# Patient Record
Sex: Female | Born: 1950
Health system: Southern US, Community
[De-identification: ages and names within clinical notes are randomized; demographics above are authoritative.]

## PROBLEM LIST (undated history)

## (undated) DIAGNOSIS — Z8052 Family history of malignant neoplasm of bladder: Secondary | ICD-10-CM

## (undated) DIAGNOSIS — Z803 Family history of malignant neoplasm of breast: Secondary | ICD-10-CM

## (undated) DIAGNOSIS — E785 Hyperlipidemia, unspecified: Secondary | ICD-10-CM

## (undated) DIAGNOSIS — Z801 Family history of malignant neoplasm of trachea, bronchus and lung: Secondary | ICD-10-CM

## (undated) DIAGNOSIS — N390 Urinary tract infection, site not specified: Secondary | ICD-10-CM

## (undated) DIAGNOSIS — I10 Essential (primary) hypertension: Secondary | ICD-10-CM

## (undated) DIAGNOSIS — Z800 Family history of malignant neoplasm of digestive organs: Secondary | ICD-10-CM

## (undated) DIAGNOSIS — B019 Varicella without complication: Secondary | ICD-10-CM

## (undated) DIAGNOSIS — K635 Polyp of colon: Secondary | ICD-10-CM

## (undated) HISTORY — DX: Polyp of colon: K63.5

## (undated) HISTORY — DX: Hyperlipidemia, unspecified: E78.5

## (undated) HISTORY — DX: Essential (primary) hypertension: I10

## (undated) HISTORY — DX: Family history of malignant neoplasm of digestive organs: Z80.0

## (undated) HISTORY — DX: Varicella without complication: B01.9

## (undated) HISTORY — DX: Family history of malignant neoplasm of breast: Z80.3

## (undated) HISTORY — DX: Urinary tract infection, site not specified: N39.0

## (undated) HISTORY — DX: Family history of malignant neoplasm of trachea, bronchus and lung: Z80.1

## (undated) HISTORY — DX: Family history of malignant neoplasm of bladder: Z80.52

## (undated) HISTORY — PX: APPENDECTOMY: SHX54

---

## 1997-04-29 ENCOUNTER — Ambulatory Visit (HOSPITAL_COMMUNITY): Admission: RE | Admit: 1997-04-29 | Discharge: 1997-04-29 | Payer: Self-pay | Admitting: Obstetrics and Gynecology

## 1997-05-08 ENCOUNTER — Ambulatory Visit (HOSPITAL_COMMUNITY): Admission: RE | Admit: 1997-05-08 | Discharge: 1997-05-08 | Payer: Self-pay | Admitting: Obstetrics and Gynecology

## 1998-01-06 ENCOUNTER — Encounter: Payer: Self-pay | Admitting: Obstetrics and Gynecology

## 1998-01-06 ENCOUNTER — Ambulatory Visit (HOSPITAL_COMMUNITY): Admission: RE | Admit: 1998-01-06 | Discharge: 1998-01-06 | Payer: Self-pay | Admitting: Obstetrics and Gynecology

## 1998-05-13 ENCOUNTER — Other Ambulatory Visit: Admission: RE | Admit: 1998-05-13 | Discharge: 1998-05-13 | Payer: Self-pay | Admitting: Obstetrics and Gynecology

## 1999-11-08 ENCOUNTER — Other Ambulatory Visit: Admission: RE | Admit: 1999-11-08 | Discharge: 1999-11-08 | Payer: Self-pay | Admitting: Obstetrics and Gynecology

## 1999-12-02 ENCOUNTER — Encounter: Payer: Self-pay | Admitting: Obstetrics and Gynecology

## 1999-12-02 ENCOUNTER — Ambulatory Visit (HOSPITAL_COMMUNITY): Admission: RE | Admit: 1999-12-02 | Discharge: 1999-12-02 | Payer: Self-pay | Admitting: Obstetrics and Gynecology

## 2000-11-08 ENCOUNTER — Other Ambulatory Visit: Admission: RE | Admit: 2000-11-08 | Discharge: 2000-11-08 | Payer: Self-pay | Admitting: Obstetrics and Gynecology

## 2002-05-01 ENCOUNTER — Ambulatory Visit (HOSPITAL_COMMUNITY): Admission: RE | Admit: 2002-05-01 | Discharge: 2002-05-01 | Payer: Self-pay | Admitting: Obstetrics and Gynecology

## 2002-05-01 ENCOUNTER — Encounter: Payer: Self-pay | Admitting: Obstetrics and Gynecology

## 2007-09-26 ENCOUNTER — Emergency Department (HOSPITAL_COMMUNITY): Admission: EM | Admit: 2007-09-26 | Discharge: 2007-09-26 | Payer: Self-pay | Admitting: Emergency Medicine

## 2008-12-24 ENCOUNTER — Encounter: Admission: RE | Admit: 2008-12-24 | Discharge: 2008-12-24 | Payer: Self-pay | Admitting: Obstetrics and Gynecology

## 2009-01-04 ENCOUNTER — Encounter: Admission: RE | Admit: 2009-01-04 | Discharge: 2009-01-04 | Payer: Self-pay | Admitting: Obstetrics and Gynecology

## 2009-01-12 ENCOUNTER — Ambulatory Visit: Payer: Self-pay | Admitting: Internal Medicine

## 2009-01-12 DIAGNOSIS — I10 Essential (primary) hypertension: Secondary | ICD-10-CM

## 2009-01-12 DIAGNOSIS — E782 Mixed hyperlipidemia: Secondary | ICD-10-CM | POA: Insufficient documentation

## 2009-07-06 ENCOUNTER — Ambulatory Visit: Payer: Self-pay | Admitting: Internal Medicine

## 2009-07-26 ENCOUNTER — Encounter (INDEPENDENT_AMBULATORY_CARE_PROVIDER_SITE_OTHER): Payer: Self-pay | Admitting: *Deleted

## 2009-08-24 ENCOUNTER — Encounter (INDEPENDENT_AMBULATORY_CARE_PROVIDER_SITE_OTHER): Payer: Self-pay | Admitting: *Deleted

## 2009-08-24 ENCOUNTER — Ambulatory Visit: Payer: Self-pay | Admitting: Gastroenterology

## 2009-08-30 ENCOUNTER — Telehealth (INDEPENDENT_AMBULATORY_CARE_PROVIDER_SITE_OTHER): Payer: Self-pay | Admitting: *Deleted

## 2009-09-03 ENCOUNTER — Ambulatory Visit: Payer: Self-pay | Admitting: Gastroenterology

## 2009-09-03 HISTORY — PX: COLONOSCOPY: SHX174

## 2009-09-08 ENCOUNTER — Encounter: Payer: Self-pay | Admitting: Gastroenterology

## 2009-12-31 ENCOUNTER — Ambulatory Visit: Payer: Self-pay | Admitting: Internal Medicine

## 2009-12-31 LAB — CONVERTED CEMR LAB
ALT: 21 units/L (ref 0–35)
AST: 22 units/L (ref 0–37)
Albumin: 4 g/dL (ref 3.5–5.2)
BUN: 17 mg/dL (ref 6–23)
Basophils Relative: 1 % (ref 0.0–3.0)
CO2: 28 meq/L (ref 19–32)
Chloride: 106 meq/L (ref 96–112)
Cholesterol: 219 mg/dL — ABNORMAL HIGH (ref 0–200)
Creatinine, Ser: 0.9 mg/dL (ref 0.4–1.2)
Direct LDL: 147.9 mg/dL
Eosinophils Absolute: 0.1 10*3/uL (ref 0.0–0.7)
Eosinophils Relative: 2.8 % (ref 0.0–5.0)
Glucose, Bld: 103 mg/dL — ABNORMAL HIGH (ref 70–99)
Hemoglobin: 12 g/dL (ref 12.0–15.0)
Ketones, ur: NEGATIVE mg/dL
Leukocytes, UA: NEGATIVE
Lymphocytes Relative: 41.2 % (ref 12.0–46.0)
MCHC: 34.2 g/dL (ref 30.0–36.0)
Monocytes Relative: 8.3 % (ref 3.0–12.0)
Neutro Abs: 2.3 10*3/uL (ref 1.4–7.7)
RBC: 3.89 M/uL (ref 3.87–5.11)
Specific Gravity, Urine: <=1.005 (ref 1.000–1.030)
TSH: 1.48 microintl units/mL (ref 0.35–5.50)
Total Protein: 6.5 g/dL (ref 6.0–8.3)
Urine Glucose: NEGATIVE mg/dL
WBC: 4.9 10*3/uL (ref 4.5–10.5)
pH: 6 (ref 5.0–8.0)

## 2010-01-05 ENCOUNTER — Encounter: Payer: Self-pay | Admitting: Internal Medicine

## 2010-01-05 ENCOUNTER — Ambulatory Visit: Payer: Self-pay | Admitting: Internal Medicine

## 2010-03-02 ENCOUNTER — Ambulatory Visit (HOSPITAL_COMMUNITY)
Admission: RE | Admit: 2010-03-02 | Discharge: 2010-03-02 | Payer: Self-pay | Source: Home / Self Care | Attending: Internal Medicine | Admitting: Internal Medicine

## 2010-03-13 ENCOUNTER — Encounter: Payer: Self-pay | Admitting: Obstetrics and Gynecology

## 2010-03-14 ENCOUNTER — Encounter: Payer: Self-pay | Admitting: Internal Medicine

## 2010-03-15 ENCOUNTER — Encounter
Admission: RE | Admit: 2010-03-15 | Discharge: 2010-03-15 | Payer: Self-pay | Source: Home / Self Care | Attending: Internal Medicine | Admitting: Internal Medicine

## 2010-03-20 LAB — CONVERTED CEMR LAB
AST: 20 units/L (ref 0–37)
Albumin: 4.3 g/dL (ref 3.5–5.2)
Basophils Absolute: 0 10*3/uL (ref 0.0–0.1)
CO2: 30 meq/L (ref 19–32)
Cholesterol: 242 mg/dL — ABNORMAL HIGH (ref 0–200)
Direct LDL: 165.9 mg/dL
Eosinophils Absolute: 0.1 10*3/uL (ref 0.0–0.7)
Glucose, Bld: 93 mg/dL (ref 70–99)
HCT: 34.2 % — ABNORMAL LOW (ref 36.0–46.0)
HDL: 71.4 mg/dL (ref >39.00)
Hemoglobin, Urine: NEGATIVE
Hemoglobin: 11.9 g/dL — ABNORMAL LOW (ref 12.0–15.0)
Lymphs Abs: 2.1 10*3/uL (ref 0.7–4.0)
MCHC: 34.7 g/dL (ref 30.0–36.0)
MCV: 92.6 fL (ref 78.0–100.0)
Neutro Abs: 3.9 10*3/uL (ref 1.4–7.7)
Potassium: 3.9 meq/L (ref 3.5–5.1)
RDW: 12.5 % (ref 11.5–14.6)
Sodium: 143 meq/L (ref 135–145)
Total Protein, Urine: NEGATIVE mg/dL
Urine Glucose: NEGATIVE mg/dL
VLDL: 17.4 mg/dL (ref 0.0–40.0)

## 2010-03-22 NOTE — Assessment & Plan Note (Signed)
Summary: CPX /NWS  #   Vital Signs:  Patient profile:   60 year old female Height:      65 inches Weight:      153.25 pounds BMI:     25.59 O2 Sat:      94 % on Room air Temp:     98.5 degrees F oral Pulse rate:   87 / minute BP sitting:   132 / 90  (left arm) Cuff size:   regular  Vitals Entered By: Zella Ball Ewing CMA Duncan Dull) (January 05, 2010 11:42 AM)  O2 Flow:  Room air   History of Present Illness: here for wellness,  Pt denies CP, worsening sob, doe, wheezing, orthopnea, pnd, worsening LE edema, palps, dizziness or syncope  Pt denies new neuro symptoms such as headache, facial or extremity weakness  Pt denies polydipsia, polyuria.  Overall good compliance with meds, trying to follow low chol diet, wt stable, little excercise however.  No fever, wt loss, night sweats, loss of appetite or other constitutional symptoms  Denies worsening depressive symptoms, suicidal ideation, or panic.   Overall good compliance with meds, and good tolerability.  .Pt states good ability with ADL's, low fall risk, home safety reviewed and adequate, no significant change in hearing or vision, trying to follow lower chol diet, and occasionally active only with regular excercise.   Never took the losartan or welchol and has been trying lower salt diet and above.   Problems Prior to Update: 1)  Skin Rash  (ICD-782.1) 2)  Skin Lesion  (ICD-709.9) 3)  Hyperlipidemia  (ICD-272.4) 4)  Preventive Health Care  (ICD-V70.0) 5)  Hypertension  (ICD-401.9)  Medications Prior to Update: 1)  Losartan Potassium 50 Mg Tabs (Losartan Potassium) .Marland Kitchen.. 1po Once Daily 2)  Aspir-Low 81 Mg Tbec (Aspirin) .Marland Kitchen.. 1  By Mouth Once Daily 3)  Welchol 3.75 Gm Pack (Colesevelam Hcl) .Marland Kitchen.. 1 Pkt By Mouth Once Daily With Water 4)  Lotrisone 1-0.05 % Crea (Clotrimazole-Betamethasone) .... Use Asd Two Times A Day As Needed  Current Medications (verified): 1)  Aspir-Low 81 Mg Tbec (Aspirin) .Marland Kitchen.. 1  By Mouth Once Daily  Allergies  (verified): No Known Drug Allergies  Past History:  Past Medical History: Last updated: 01/12/2009 early menopause at 60yo Hypertension Hyperlipidemia  Past Surgical History: Last updated: 01/12/2009 Denies surgical history  Family History: Last updated: 01/12/2009 1 child with severe spinal meningitis with severe developmental d/o father died with heart disease at 28yo, also COPD/smoker mother with CABG in her early 48's, HTN m-grandmother with stroke m-grandfather with colon cancer m-uncle with pancreatic  cancer m-uncle with bladder cancer  Social History: Last updated: 01/05/2010 Married 2 children - 1 child ADHD 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 Never Smoked Alcohol use-yes - occasional Drug use-no  Risk Factors: Smoking Status: never (01/12/2009)  Social History: Married 2 children - 1 child 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         all otherwise negative per pt -    Physical Exam  General:  alert and well-developed.   Head:  normocephalic and atraumatic.  Eyes:  vision grossly intact, pupils equal, and pupils round.   Ears:  R ear normal and L ear normal.   Nose:  no external deformity and no nasal discharge.   Mouth:  no gingival abnormalities and pharynx pink and moist.   Neck:  supple and no masses.   Lungs:  normal respiratory effort and normal breath sounds.   Heart:  normal rate and regular  rhythm.   Abdomen:  soft, non-tender, and normal bowel sounds.   Msk:  no joint tenderness and no joint swelling.   Extremities:  no edema, no erythema  Neurologic:  cranial nerves II-XII intact and strength normal in all extremities.   Skin:  color normal and no rashes.   Psych:  not anxious appearing and not depressed appearing.     Impression & Recommendations:  Problem # 1:  Preventive Health Care (ICD-V70.0) Overall doing well, age appropriate education and counseling updated, referral for preventive services and immunizations addressed, dietary counseling and smoking status adressed , most recent labs reviewed, ecg reviewed I have personally reviewed and have noted 1.The patient's medical and social history 2.Their use of alcohol, tobacco or illicit drugs 3.Their current medications and supplements 4. Functional ability including ADL's, fall risk, home safety risk, hearing & visual impairment  5.Diet and physical activities 6.Evidence for depression or mood disorders The patients weight, height, BMI  have been recorded in the chart I have made referrals, counseling and provided education to the patient based review of the above  Orders: EKG w/ Interpretation (93000)  Problem # 2:  HYPERLIPIDEMIA (ICD-272.4)  The following medications were removed from the medication list:    Welchol 3.75 Gm Pack (Colesevelam hcl) .Marland Kitchen... 1 pkt by mouth once daily with water  Labs Reviewed: SGOT: 22 (12/31/2009)   SGPT: 21 (12/31/2009)   HDL:61.10 (12/31/2009), 71.40 (01/12/2009)  Chol:219 (12/31/2009), 242 (01/12/2009)  Trig:81.0 (12/31/2009), 87.0 (01/12/2009) pt declines further med, Pt to continue diet efforts,; to check labs next visit - goal LDL less than 100  Complete Medication List: 1)  Aspir-low 81 Mg Tbec (Aspirin) .Marland Kitchen.. 1  by mouth once daily  Other Orders: Admin 1st Vaccine ([address]) Flu Vaccine 44yrs + [phone])  Patient Instructions: 1)  you had the flu shot today 2)  please  call for your yearly pap smear and mammogram 3)  Continue all previous medications as before this visit  4)  Please follow lower cholesterol diet 5)  Please schedule a follow-up appointment in 1 year, or sooner if needed   Orders Added: 1)  Admin 1st Vaccine [90471] 2)  Flu Vaccine 31yrs + [90658] 3)  EKG w/ Interpretation [93000] 4)  Est. Patient 40-64 years [99396]  Flu Vaccine Consent Questions     Do you have a history of severe allergic reactions to this vaccine? no    Any prior history of allergic reactions to egg and/or gelatin? no    Do you have a sensitivity to the preservative Thimersol? no    Do you have a past history of Guillan-Barre Syndrome? no    Do you currently have an acute febrile illness? no    Have you ever had a severe reaction to latex? no    Vaccine information given and explained to patient? yes    Are you currently pregnant? no    Lot Number:[id number]   Exp Date:08/20/2010   Site Given  Left Deltoid IM       .[email or web address]

## 2010-03-22 NOTE — Letter (Signed)
Summary: [facility] Instructions  [facility]  [address] [place], [place] [address]   Phone: [phone]  Fax: [phone]       Dana Hodge    1950-09-23    MRN: [id number]        Procedure Day Dorna Bloom:  Farrell Ours  09/03/09     Arrival Time:  9:00AM     Procedure Time:  10:00AM     Location of Procedure:                    Juliann Pares _  [facility] (4th Floor)                        PREPARATION FOR COLONOSCOPY WITH MOVIPREP   Starting 5 days prior to your procedure 08/29/09 do not eat nuts, seeds, popcorn, corn, beans, peas,  salads, or any raw vegetables.  Do not take any fiber supplements (e.g. Metamucil, Citrucel, and Benefiber).  THE DAY BEFORE YOUR PROCEDURE         DATE: 09/02/09  DAY: THURSDAY  1.  Drink clear liquids the entire day-NO SOLID FOOD  2.  Do not drink anything colored red or purple.  Avoid juices with pulp.  No orange juice.  3.  Drink at least 64 oz. (8 glasses) of fluid/clear liquids during the day to prevent dehydration and help the prep work efficiently.  CLEAR LIQUIDS INCLUDE: Water Jello Ice Popsicles Tea (sugar ok, no milk/cream) Powdered fruit flavored drinks Coffee (sugar ok, no milk/cream) Gatorade Juice: apple, white grape, white cranberry  Lemonade Clear bullion, consomm, broth Carbonated beverages (any kind) Strained chicken noodle soup Hard Candy                             4.  In the morning, mix first dose of MoviPrep solution:    Empty 1 Pouch A and 1 Pouch B into the disposable container    Add lukewarm drinking water to the top line of the container. Mix to dissolve    Refrigerate (mixed solution should be used within 24 hrs)  5.  Begin drinking the prep at 5:00 p.m. The MoviPrep container is divided by 4 marks.   Every 15 minutes drink the solution down to the next mark (approximately 8 oz) until the full liter is complete.   6.  Follow completed prep with 16 oz of clear liquid of your choice (Nothing  red or purple).  Continue to drink clear liquids until bedtime.  7.  Before going to bed, mix second dose of MoviPrep solution:    Empty 1 Pouch A and 1 Pouch B into the disposable container    Add lukewarm drinking water to the top line of the container. Mix to dissolve    Refrigerate  THE DAY OF YOUR PROCEDURE      DATE: 09/03/09  DAY: FRIDAY  Beginning at 5:00AM (5 hours before procedure):         1. Every 15 minutes, drink the solution down to the next mark (approx 8 oz) until the full liter is complete.  2. Follow completed prep with 16 oz. of clear liquid of your choice.    3. You may drink clear liquids until 8:00AM (2 HOURS BEFORE PROCEDURE).   MEDICATION INSTRUCTIONS  Unless otherwise instructed, you should take regular prescription medications with a small sip of water   as early as possible the morning  of your procedure.        OTHER INSTRUCTIONS  You will need a responsible adult at least 58 years of age to accompany you and drive you home.   This person must remain in the waiting room during your procedure.  Wear loose fitting clothing that is easily removed.  Leave jewelry and other valuables at home.  However, you may wish to bring a book to read or  an iPod/MP3 player to listen to music as you wait for your procedure to start.  Remove all body piercing jewelry and leave at home.  Total time from sign-in until discharge is approximately 2-3 hours.  You should go home directly after your procedure and rest.  You can resume normal activities the  day after your procedure.  The day of your procedure you should not:   Drive   Make legal decisions   Operate machinery   Drink alcohol   Return to work  You will receive specific instructions about eating, activities and medications before you leave.    The above instructions have been reviewed and explained to me by  Wyona Almas RN  August 24, 2009 1:58 PM     I fully understand and can  verbalize these instructions _____________________________ Date _________

## 2010-03-22 NOTE — Letter (Signed)
Summary: Results Letter  [facility]  [address] [place], [place] [address]   Phone: [phone]  Fax: [phone]        September 08, 2009 MRN: [id number]    [facility] [address] [place], [place]  [address]    Dear Ms. Puthoff,   Good news.  The polyp(s) that were removed during your recent procedure were NOT pre-cancerous.  You should continue to follow current colorectal cancer screening guidelines with a repeat colonoscopy in 10 years.  We will therefore put your information in our reminder system and will contact you in 10 years to schedule a repeat procedure.  Please call if you have any questions or concerns.       Sincerely,  Rachael Fee MD  This letter has been electronically signed by your physician.  Appended Document: Results Letter letter mailed.

## 2010-03-22 NOTE — Letter (Signed)
Summary: Previsit letter  [facility]  [address] [place], [place] [address]   Phone: [phone]  Fax: [phone]       07/26/2009 MRN: [id number]  [facility] [address] [place], [place]  [address]  Dear Ms. Aul,  Welcome to the Gastroenterology Division at [facility].    You are scheduled to see a nurse for your pre-procedure visit on 08-27-09 at 8:30a.m. on the 3rd floor at [facility], [address]  We ask that you try to arrive at our office 15 minutes prior to your appointment time to allow for check-in.  Your nurse visit will consist of discussing your medical and surgical history, your immediate family medical history, and your medications.    Please bring a complete list of all your medications or, if you prefer, bring the medication bottles and we will list them.  We will need to be aware of both prescribed and over the counter drugs.  We will need to know exact dosage information as well.  If you are on blood thinners (Coumadin, Plavix, Aggrenox, Ticlid, etc.) please call our office today/prior to your appointment, as we need to consult with your physician about holding your medication.   Please be prepared to read and sign documents such as consent forms, a financial agreement, and acknowledgement forms.  If necessary, and with your consent, a friend or relative is welcome to sit-in on the nurse visit with you.  Please bring your insurance card so that we may make a copy of it.  If your insurance requires a referral to see a specialist, please bring your referral form from your primary care physician.  No co-pay is required for this nurse visit.     If you cannot keep your appointment, please call [phone] to cancel or reschedule prior to your appointment date.  This allows [place] the opportunity to schedule an appointment for another patient in need of care.    Thank you for choosing  Gastroenterology for your medical  needs.  We appreciate the opportunity to care for you.  Please visit [place] at our website  to learn more about our practice.                     Sincerely.                                                                                                                   The Gastroenterology Division

## 2010-03-22 NOTE — Miscellaneous (Signed)
Summary: [facility] Previsit/prep  Clinical Lists Changes  Medications: Added new medication of MOVIPREP 100 GM  SOLR (PEG-KCL-NACL-NASULF-NA ASC-C) As per prep instructions. - Signed Rx of MOVIPREP 100 GM  SOLR (PEG-KCL-NACL-NASULF-NA ASC-C) As per prep instructions.;  #1 x 0;  Signed;  Entered by: Wyona Almas RN;  Authorized by: Rachael Fee MD;  Method used: Electronically to [facility] [address] [place], [place], [place]  [address], Ph: [phone], Fax: [phone] Observations: Added new observation of NKA: T (08/24/2009 13:15)    Prescriptions: MOVIPREP 100 GM  SOLR (PEG-KCL-NACL-NASULF-NA ASC-C) As per prep instructions.  #1 x 0   Entered by:   Wyona Almas RN   Authorized by:   Rachael Fee MD   Signed by:   Wyona Almas RN on 08/24/2009   Method used:   Electronically to        [facility] (retail)       [address]       [place], [place]  [address]       Ph: [phone]       Fax: [phone]   RxID:   [id number]

## 2010-03-22 NOTE — Progress Notes (Signed)
Summary: prep ?'s  Phone Note Call from Patient Call back at Work Phone [phone]   Caller: Patient Call For: Dr. Christella Hartigan Reason for Call: Talk to Nurse Summary of Call: prep ?'s Initial call taken by: Vallarie Mare,  August 30, 2009 10:09 AM  Follow-up for Phone Call        pt's questions were answered Follow-up by: Ezra Sites RN,  August 30, 2009 10:13 AM

## 2010-03-22 NOTE — Procedures (Signed)
Summary: Colonoscopy  Patient: Dana Hodge Note: All result statuses are Final unless otherwise noted.  Tests: (1) Colonoscopy (COL)   COL Colonoscopy           DONE     [facility]     [address]     [place], [place]  [address]           COLONOSCOPY PROCEDURE REPORT           PATIENT:  [name], [name]  MR#:  [id number]     BIRTHDATE:  Mar 06, 1950, 58 yrs. old  GENDER:  female     ENDOSCOPIST:  Rachael Fee, MD     REF. BY:  Oliver Barre, M.D.     PROCEDURE DATE:  09/03/2009     PROCEDURE:  Colonoscopy with snare polypectomy     ASA CLASS:  Class II     INDICATIONS:  Routine Risk Screening     MEDICATIONS:   Fentanyl 50 mcg IV, Versed 6 mg IV           DESCRIPTION OF PROCEDURE:   After the risks benefits and     alternatives of the procedure were thoroughly explained, informed     consent was obtained.  Digital rectal exam was performed and     revealed no rectal masses.   The LB [id number] endoscope     was introduced through the anus and advanced to the cecum, which     was identified by both the appendix and ileocecal valve, without     limitations.  The quality of the prep was good, using MoviPrep.     The instrument was then slowly withdrawn as the colon was fully     examined.     <<PROCEDUREIMAGES>>           FINDINGS:  Two small sessile polyps were found, both were removed     with cold snare and both were sent to pathology (jar 1). These     were 4mm across, located in ascending and descending colon     segments (see image4 and image5).  Internal and external     hemorrhoids were found. These were small, not thrombosed.  This     was otherwise a normal examination of the colon (see image6,     image2, and image1).   Retroflexed views in the rectum revealed no     abnormalities.    The scope was then withdrawn from the patient     and the procedure completed.           COMPLICATIONS:  None     ENDOSCOPIC IMPRESSION:     1) Two small polyps were  removed and both were sent to pathology           2) Internal and external hemorrhoids     3) Otherwise normal examination           RECOMMENDATIONS:     1) If the polyp(s) removed today are proven to be adenomatous     (pre-cancerous) polyps, you will need a repeat colonoscopy in 5     years. Otherwise you should continue to follow colorectal cancer     screening guidelines for "routine risk" patients with colonoscopy     in 10 years.     2) You will receive a letter within 1-2 weeks with the results     of your biopsy as well as final recommendations. Please call my  office if you have not received a letter after 3 weeks.           ______________________________     Rachael Fee, MD           n.     eSIGNED:   Rachael Fee at 09/03/2009 10:36 AM           Maretta Bees, [phone]  Note: An exclamation mark (!) indicates a result that was not dispersed into the flowsheet. Document Creation Date: 09/03/2009 10:37 AM _______________________________________________________________________  (1) Order result status: Final Collection or observation date-time: 09/03/2009 10:31 Requested date-time:  Receipt date-time:  Reported date-time:  Referring Physician:   Ordering Physician: Rob Bunting [phone]) Specimen Source:  Source: Launa Grill Order Number: [id number] Lab site:   Appended Document: Colonoscopy     Procedures Next Due Date:    Colonoscopy: 08/2019

## 2010-03-22 NOTE — Assessment & Plan Note (Signed)
   Vital Signs:  Patient profile:   60 year old female Height:      65 inches Weight:      153.25 pounds BMI:     25.59 O2 Sat:      94 % on Room air Temp:     98.5 degrees F oral Pulse rate:   87 / minute BP sitting:   132 / 90  (left arm) Cuff size:   regular  Vitals Entered By: Zella Ball Ewing CMA Duncan Dull) (January 05, 2010 10:49 AM)  O2 Flow:  Room air  CC: Adult Physical/RE   CC:  Adult Physical/RE.  Current Medications (verified): 1)  Losartan Potassium 50 Mg Tabs (Losartan Potassium) .Marland Kitchen.. 1po Once Daily 2)  Aspir-Low 81 Mg Tbec (Aspirin) .Marland Kitchen.. 1  By Mouth Once Daily 3)  Welchol 3.75 Gm Pack (Colesevelam Hcl) .Marland Kitchen.. 1 Pkt By Mouth Once Daily With Water 4)  Lotrisone 1-0.05 % Crea (Clotrimazole-Betamethasone) .... Use Asd Two Times A Day As Needed  Allergies (verified): No Known Drug Allergies   Impression & Recommendations:  Problem # 1:  PREVENTIVE HEALTH CARE (ICD-V70.0)  Orders: EKG w/ Interpretation (93000)  Complete Medication List: 1)  Losartan Potassium 50 Mg Tabs (Losartan potassium) .Marland Kitchen.. 1po once daily 2)  Aspir-low 81 Mg Tbec (Aspirin) .Marland Kitchen.. 1  by mouth once daily 3)  Welchol 3.75 Gm Pack (Colesevelam hcl) .Marland Kitchen.. 1 pkt by mouth once daily with water 4)  Lotrisone 1-0.05 % Crea (Clotrimazole-betamethasone) .... Use asd two times a day as needed  Other Orders: Admin 1st Vaccine ([address]) Flu Vaccine 41yrs + ([address]) No Charge Patient Arrived (NCPA0) (NCPA0)  Patient Instructions: 1)  you had the flu shot today 2)  please remember to call for yearly mammogram and pap smear    Orders Added: 1)  Admin 1st Vaccine [90471] 2)  Flu Vaccine 37yrs + [90658] 3)  EKG w/ Interpretation [93000] 4)  No Charge Patient Arrived (NCPA0) [NCPA0]  Flu Vaccine Consent Questions     Do you have a history of severe allergic reactions to this vaccine? no    Any prior history of allergic reactions to egg and/or gelatin? no    Do you have a sensitivity to the preservative  Thimersol? no    Do you have a past history of Guillan-Barre Syndrome? no    Do you currently have an acute febrile illness? no    Have you ever had a severe reaction to latex? no    Vaccine information given and explained to patient? yes    Are you currently pregnant? no    Lot Number:[id number]   Exp Date:08/20/2010   Site Given  Left Deltoid IM       .[email or web address]

## 2010-03-22 NOTE — Assessment & Plan Note (Signed)
Summary: 6 mos f/u #/cd   Vital Signs:  Patient profile:   60 year old female Height:      64.5 inches Weight:      153 pounds BMI:     25.95 O2 Sat:      99 % on Room air Temp:     98.4 degrees F oral Pulse rate:   80 / minute BP sitting:   120 / 74  (left arm) Cuff size:   regular  Vitals Entered ByZella Ball Ewing (Jul 06, 2009 3:11 PM)  O2 Flow:  Room air CC: 6 Mo ROV/RE   CC:  6 Mo ROV/RE.  History of Present Illness: here to f/u; has 2 specific skin sites to examine; the first to the right low mid abd stable for months,  also a faint but discrete superficial lesion to the right upper forehead at the hairline with mild itching;  Pt denies CP, sob, doe, wheezing, orthopnea, pnd, worsening LE edema, palps, dizziness or syncope   Pt denies new neuro symptoms such as headache, facial or extremity weakness .  Is trying to follow low chol diet, is quite wary of sstatin meds as her friends have had significant side effects such as myalgias.   No other new complaints.    Problems Prior to Update: 1)  Skin Rash  (ICD-782.1) 2)  Skin Lesion  (ICD-709.9) 3)  Hyperlipidemia  (ICD-272.4) 4)  Preventive Health Care  (ICD-V70.0) 5)  Hypertension  (ICD-401.9)  Medications Prior to Update: 1)  Losartan Potassium 50 Mg Tabs (Losartan Potassium) .Marland Kitchen.. 1po Once Daily 2)  Aspir-Low 81 Mg Tbec (Aspirin) .Marland Kitchen.. 1  By Mouth Once Daily  Current Medications (verified): 1)  Losartan Potassium 50 Mg Tabs (Losartan Potassium) .Marland Kitchen.. 1po Once Daily 2)  Aspir-Low 81 Mg Tbec (Aspirin) .Marland Kitchen.. 1  By Mouth Once Daily 3)  Welchol 3.75 Gm Pack (Colesevelam Hcl) .Marland Kitchen.. 1 Pkt By Mouth Once Daily With Water 4)  Lotrisone 1-0.05 % Crea (Clotrimazole-Betamethasone) .... 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   Head:  normocephalic and atraumatic.   Eyes:  vision grossly intact, pupils equal, and pupils round.   Ears:  R ear normal and L ear normal.   Nose:  no external deformity and no nasal discharge.   Mouth:  no gingival abnormalities and pharynx pink and moist.   Neck:  supple and no masses.   Lungs:  normal respiratory effort and normal breath sounds.   Heart:  normal rate and regular rhythm.   Extremities:  no edema, no erythema  Skin:  1 lesion to right lower abd area - c/w seb keratosis,  also 1.5 cm oval area right upper forehead at the hairline plaquelike faint butmild erythema, nontender nonraised   Impression & Recommendations:  Problem # 1:  HYPERLIPIDEMIA (ICD-272.4)  Her updated medication list for this problem includes:    Welchol 3.75 Gm Pack (Colesevelam hcl) .Marland Kitchen... 1 pkt  by mouth once daily with water treat as above, f/u any worsening signs or symptoms ;  consider statin but no wanting to try at this time due to potential myalgias  Labs Reviewed: SGOT: 20 (01/12/2009)   SGPT: 17 (01/12/2009)   HDL:71.40 (01/12/2009)  Chol:242 (01/12/2009)  Trig:87.0 (01/12/2009)  Problem # 2:  SKIN LESION (ICD-709.9) right low mid abd - c/w seb keratosis  Problem # 3:  HYPERTENSION (ICD-401.9)  Her updated medication list for this problem includes:    Losartan Potassium 50 Mg Tabs (Losartan potassium) .Marland Kitchen... 1po once daily  BP today: 120/74 Prior BP: 144/88 (01/12/2009)  Labs Reviewed: K+: 3.9 (01/12/2009) Creat: : 0.9 (01/12/2009)   Chol: 242 (01/12/2009)   HDL: 71.40 (01/12/2009)   TG: 87.0  (01/12/2009) stable overall by hx and exam, ok to continue meds/tx as is   Problem # 4:  Preventive Health Care (ICD-V70.0)   not charged - but due for  colonosocpy  Orders: Gastroenterology Referral (GI)  Problem # 5:  SKIN RASH (ICD-782.1)  right forehead at the hairline - to try lotrisone as needed, to derm if not improved  Her updated medication list for this problem includes:    Lotrisone 1-0.05 % Crea (Clotrimazole-betamethasone) ..... Use asd two times a day as needed  Complete Medication List: 1)  Losartan Potassium 50 Mg Tabs (Losartan potassium) .Marland Kitchen.. 1po once daily 2)  Aspir-low 81 Mg Tbec (Aspirin) .Marland Kitchen.. 1  by mouth once daily 3)  Welchol 3.75 Gm Pack (Colesevelam hcl) .Marland Kitchen.. 1 pkt by mouth once daily with water 4)  Lotrisone 1-0.05 % Crea (Clotrimazole-betamethasone) .... Use asd two times a day as needed  Patient Instructions: 1)  Please take all new medications as prescribed 2)  Continue all previous medications as before this visit  3)  You will be contacted about the referral(s) to: colonoscopy 4)  Please schedule a follow-up appointment in 6 months with:  CPX labs Prescriptions: LOTRISONE 1-0.05 % CREA (CLOTRIMAZOLE-BETAMETHASONE) use asd two times a day as needed  #1 x 1   Entered and Authorized by:   Corwin Levins MD   Signed by:   Corwin Levins MD on 07/06/2009   Method used:   Electronically to        [facility] (retail)       [address]       [place], [place]  [address]       Ph: [phone]       Fax: [phone]   RxID:   [id number] WELCHOL 3.75 GM PACK (COLESEVELAM HCL) 1 pkt by mouth once daily with water  #30 x 11   Entered and Authorized by:   Corwin Levins MD   Signed by:   Corwin Levins MD on 07/06/2009   Method used:   Print then Give to Patient   RxID:   [id number]

## 2010-03-23 ENCOUNTER — Encounter: Payer: Self-pay | Admitting: Internal Medicine

## 2012-05-22 ENCOUNTER — Encounter: Payer: Self-pay | Admitting: Family Medicine

## 2012-05-22 ENCOUNTER — Telehealth: Payer: Self-pay | Admitting: Internal Medicine

## 2012-05-22 NOTE — Telephone Encounter (Signed)
Ok with me 

## 2012-05-22 NOTE — Telephone Encounter (Signed)
Pt would like to switch to Dr Selena Batten from Dr Jonny Ruiz.  Pt would like a female doctor. Is that OK with you Dr Jonny Ruiz? Is that OK with you Dr Selena Batten?

## 2012-05-22 NOTE — Telephone Encounter (Signed)
That is fine with me if ok with PCP. Need new patient visit which is not to address any acute issues but is to review medical history. Thanks.

## 2012-05-22 NOTE — Telephone Encounter (Signed)
Called the patient informed ok per PCP to change providers.  Informed of new providers instruction regarding new patient appointment.

## 2012-05-29 ENCOUNTER — Ambulatory Visit (INDEPENDENT_AMBULATORY_CARE_PROVIDER_SITE_OTHER): Payer: PRIVATE HEALTH INSURANCE | Admitting: Family Medicine

## 2012-05-29 ENCOUNTER — Encounter: Payer: Self-pay | Admitting: Family Medicine

## 2012-05-29 VITALS — BP 130/80 | HR 82 | Temp 98.3°F | Ht 65.5 in | Wt 160.0 lb

## 2012-05-29 DIAGNOSIS — Z7689 Persons encountering health services in other specified circumstances: Secondary | ICD-10-CM

## 2012-05-29 DIAGNOSIS — Z7189 Other specified counseling: Secondary | ICD-10-CM

## 2012-05-29 DIAGNOSIS — I10 Essential (primary) hypertension: Secondary | ICD-10-CM

## 2012-05-29 DIAGNOSIS — E785 Hyperlipidemia, unspecified: Secondary | ICD-10-CM

## 2012-05-29 LAB — LIPID PANEL
HDL: 56.7 mg/dL (ref >39.00)
Triglycerides: 124 mg/dL (ref 0.0–149.0)

## 2012-05-29 LAB — LDL CHOLESTEROL, DIRECT: Direct LDL: 169 mg/dL

## 2012-05-29 NOTE — Progress Notes (Addendum)
Chief Complaint  Patient presents with  . Establish Care    HPI:  Keshayla Schrum Tollett is here to establish care. Has been here for some time - former doctor retired, then saw Dr. Jonny Ruiz for some time.  Last PCP and physical: last physical in 2011  Has the following chronic problems and concerns today: Needs form completed for work  Patient Active Problem List  Diagnosis  . HYPERLIPIDEMIA  . HYPERTENSION  These were tx with medication in the past when on HRT for hot flashes - but then improved and meds stopped. She prefers not to take medications.  Health Maintenance: -pap in 2011, all normal -colonoscopy - normal, 10 years -mammo - Jan 2012 -she will schedule physical  ROS: See pertinent positives and negatives per HPI.  Past Medical History  Diagnosis Date  . Hyperlipidemia   . Hypertension     Family History  Problem Relation Age of Onset  . Heart disease Mother 44    CAD  . Hypertension Mother   . Heart disease Father 62    MI  . COPD Father   . Cancer Maternal Uncle     colon, pancreatic  . Cancer Maternal Grandfather     colon, pancreatic    History   Social History  . Marital Status: Married    Spouse Name: N/A    Number of Children: N/A  . Years of Education: N/A   Social History Main Topics  . Smoking status: Never Smoker   . Smokeless tobacco: None  . Alcohol Use: Yes     Comment: a glass of wine with dinner   . Drug Use: None  . Sexually Active: None   Other Topics Concern  . None   Social History Narrative   Work or School: works as Naval architect, office work      Home Situation: lives with husband and handicapped adult son      Spiritual Beliefs: Christian      Lifestyle: going to the gym 3-4 days per week, working on diet - portion sizes are an issue             Current outpatient prescriptions:aspirin 81 MG tablet, Take 81 mg by mouth daily., Disp: , Rfl:   EXAM:  Filed Vitals:   05/29/12 0830  BP: 130/80  Pulse: 82  Temp:  98.3 F (36.8 C)    Body mass index is 26.21 kg/(m^2).  GENERAL: vitals reviewed and listed above, alert, oriented, appears well hydrated and in no acute distress  HEENT: atraumatic, conjunttiva clear, no obvious abnormalities on inspection of external nose and ears  NECK: no obvious masses on inspection  LUNGS: clear to auscultation bilaterally, no wheezes, rales or rhonchi, good air movement  CV: HRRR, no peripheral edema  MS: moves all extremities without noticeable abnormality  PSYCH: pleasant and cooperative, no obvious depression or anxiety  ASSESSMENT AND PLAN:  Discussed the following assessment and plan:  Encounter to establish care - Plan: Hemoglobin A1c  HYPERLIPIDEMIA - Plan: Lipid Panel  HYPERTENSION -We reviewed the PMH, PSH, FH, SH, Meds and Allergies. -FASTING LABS - needs form faxed to work with lab results -follow up in next few months for physical  -Patient advised to return or notify a doctor immediately if symptoms worsen or persist or new concerns arise.  Patient Instructions  -We have ordered labs or studies at this visit. It can take up to 1-2 weeks for results and processing. We will contact you with instructions  IF your results are abnormal. Normal results will be released to your [facility]. If you have not heard from [place] or can not find your results in [facility] in 2 weeks please contact our office.  -PLEASE SIGN UP FOR MYCHART TODAY   We recommend the following healthy lifestyle measures: - eat a healthy diet consisting of lots of vegetables, fruits, beans, nuts, seeds, healthy meats such as white chicken and fish and whole grains.  - avoid fried foods, fast food, processed foods, sodas, red meet and other fattening foods.  - get a least 150 minutes of aerobic exercise per week.   Follow up in: 2 months for physical exam      Blaine Hari R.

## 2012-05-29 NOTE — Patient Instructions (Addendum)
-  We have ordered labs or studies at this visit. It can take up to 1-2 weeks for results and processing. We will contact you with instructions IF your results are abnormal. Normal results will be released to your [facility]. If you have not heard from [place] or can not find your results in [facility] in 2 weeks please contact our office.  -PLEASE SIGN UP FOR MYCHART TODAY   We recommend the following healthy lifestyle measures: - eat a healthy diet consisting of lots of vegetables, fruits, beans, nuts, seeds, healthy meats such as white chicken and fish and whole grains.  - avoid fried foods, fast food, processed foods, sodas, red meet and other fattening foods.  - get a least 150 minutes of aerobic exercise per week.   Follow up in: 2 months for physical exam

## 2012-05-30 ENCOUNTER — Telehealth: Payer: Self-pay | Admitting: Family Medicine

## 2012-05-30 NOTE — Telephone Encounter (Signed)
Please let patient know,  -cholesterol is a bit high -diabetes screening lab is a little high (>5.6) indicating a risk for developing diabetes  The best treatment to hopefully reverse these findings and prevent adverse health outcomes is a healthy diet and regular exercise.  Schedule follow up in about 3-4 months if not already scheduled to do so.  ALSO, PLEASE FILL OUT FORM FOR WORK FOR PATIENT AND FAX/SEND TO HER WORK AND LET HER KNOW WE COMPLETED THIS. THANKS.

## 2012-05-31 NOTE — Telephone Encounter (Signed)
Called and spoke with pt and pt is aware.  Pt also aware that Preventive Care Confirmation was faxed back to [employer]

## 2012-07-31 ENCOUNTER — Encounter: Payer: PRIVATE HEALTH INSURANCE | Admitting: Family Medicine

## 2012-08-28 ENCOUNTER — Other Ambulatory Visit (HOSPITAL_COMMUNITY)
Admission: RE | Admit: 2012-08-28 | Discharge: 2012-08-28 | Disposition: A | Discharge status: Home / Self Care | Payer: No Typology Code available for payment source | Source: Ambulatory Visit | Attending: Family Medicine | Admitting: Family Medicine

## 2012-08-28 ENCOUNTER — Encounter: Payer: Self-pay | Admitting: Family Medicine

## 2012-08-28 ENCOUNTER — Ambulatory Visit (INDEPENDENT_AMBULATORY_CARE_PROVIDER_SITE_OTHER): Payer: PRIVATE HEALTH INSURANCE | Admitting: Family Medicine

## 2012-08-28 VITALS — BP 110/74 | Temp 98.3°F | Ht 65.5 in | Wt 166.0 lb

## 2012-08-28 DIAGNOSIS — Z0000 Encounter for general adult medical examination without abnormal findings: Secondary | ICD-10-CM

## 2012-08-28 DIAGNOSIS — R7303 Prediabetes: Secondary | ICD-10-CM

## 2012-08-28 DIAGNOSIS — E785 Hyperlipidemia, unspecified: Secondary | ICD-10-CM

## 2012-08-28 DIAGNOSIS — Z01419 Encounter for gynecological examination (general) (routine) without abnormal findings: Secondary | ICD-10-CM | POA: Insufficient documentation

## 2012-08-28 DIAGNOSIS — R7309 Other abnormal glucose: Secondary | ICD-10-CM

## 2012-08-28 DIAGNOSIS — Z1151 Encounter for screening for human papillomavirus (HPV): Secondary | ICD-10-CM | POA: Insufficient documentation

## 2012-08-28 NOTE — Patient Instructions (Addendum)
-  schedule mammoogram - call [phone]  -We recommend the following healthy lifestyle measures: - eat a healthy diet consisting of lots of vegetables, fruits, beans, nuts, seeds, healthy meats such as white chicken and fish and whole grains.  - avoid fried foods, fast food, processed foods, sodas, red meet and other fattening foods.  - get a least 150 minutes of aerobic exercise per week.   -We have ordered labs or studies at this visit. It can take up to 1-2 weeks for results and processing. We will contact you with instructions IF your results are abnormal. Normal results will be released to your [facility]. If you have not heard from [place] or can not find your results in [facility] in 2 weeks please contact our office.  -follow up in in 3-4 month in November for fasting labs and follow up

## 2012-08-28 NOTE — Progress Notes (Signed)
Chief Complaint  Patient presents with  . Annual Exam    HPI:  Here for CPE:  -Concerns today: none  1-2)Prediabetes/HLD: -on last labs -lifestyle recs advised - she reports has not been good with diet and exercise - willing to start working on this -she thinks she could reduce carbs; has gym membership and could try to increase this activty  -vit d/cal: no  -Exercise: gym 3-4 days per week  -Diabetes and Dyslipidemia Screening: done 05/2012  -Hx of HTN: no   -Vaccines: UTD  -pap history: 2011, normal per her report  -FDLMP: postmenopausal, no bleeding  -sexual activity: yes, female partner, no new partners  -wants STI testing: no, refused hep c testing  -FH breast, colon or ovarian ca: see FH -last mammo 2012  -last colonoscopy normal in 2011 per her report -Alcohol, Tobacco, drug use: see social history  Review of Systems - Review of Systems  Constitutional: Negative for fever, weight loss and malaise/fatigue.  Eyes: Negative for blurred vision and double vision.  Respiratory: Negative for shortness of breath.   Cardiovascular: Negative for chest pain and leg swelling.  Gastrointestinal: Negative for nausea and vomiting.  Genitourinary: Negative for dysuria.  Musculoskeletal: Negative for myalgias.  Neurological: Negative for dizziness and headaches.  Endo/Heme/Allergies: Does not bruise/bleed easily.  Psychiatric/Behavioral: Negative for depression and suicidal ideas.     Past Medical History  Diagnosis Date  . Hyperlipidemia   . Hypertension   . Chicken pox   . UTI (urinary tract infection)   . Colon polyp     Family History  Problem Relation Age of Onset  . Heart disease Mother 47    CAD  . Hypertension Mother   . Heart disease Father 9    MI  . COPD Father   . Cancer Maternal Uncle     colon, pancreatic  . Cancer Maternal Grandfather     colon, pancreatic    History   Social History  . Marital Status: Married    Spouse Name: N/A   Number of Children: N/A  . Years of Education: N/A   Social History Main Topics  . Smoking status: Never Smoker   . Smokeless tobacco: None  . Alcohol Use: Yes     Comment: a glass of wine with dinner   . Drug Use: None  . Sexually Active: None   Other Topics Concern  . None   Social History Narrative   Work or School: works as Naval architect, office work      Home Situation: lives with husband and handicapped adult son      Spiritual Beliefs: Christian      Lifestyle: going to the gym 3-4 days per week, working on diet - portion sizes are an issue             Current outpatient prescriptions:aspirin 81 MG tablet, Take 81 mg by mouth daily., Disp: , Rfl:   EXAM:  Filed Vitals:   08/28/12 0923  BP: 110/74  Temp: 98.3 F (36.8 C)    GENERAL: vitals reviewed and listed below, alert, oriented, appears well hydrated and in no acute distress  HEENT: head atraumatic, PERRLA, normal appearance of eyes, ears, nose and mouth. moist mucus membranes.  NECK: supple, no masses or lymphadenopathy  LUNGS: clear to auscultation bilaterally, no rales, rhonchi or wheeze  CV: HRRR, no peripheral edema or cyanosis, normal pedal pulses  BREAST: normal appearance - no lesions or discharge, on palpation normal breast tissue without any  suspicious masses  ABDOMEN: bowel sounds normal, soft, non tender to palpation, no masses, no rebound or guarding  GU: normal appearance of external genitalia - no lesions or masses, normal vaginal mucosa - no abnormal discharge, normal appearance of cervix - no lesions or abnormal discharge, no masses or tenderness on palpation of uterus and ovaries.  RECTAL: refused  SKIN: no rash or abnormal lesions  MS: normal gait, moves all extremities normally  NEURO: CN II-XII grossly intact, normal muscle strength and sensation to light touch on extremities  PSYCH: normal affect, pleasant and cooperative  ASSESSMENT AND PLAN:  Discussed the  following assessment and plan:  Routine general medical examination at a health care facility - Plan: Cytology - PAP -Discussed and advised all [place] preventive services health task force level A and B recommendations for age, sex and risks. -Advised at least 150 minutes of exercise per week and a healthy diet low in saturated fats and sweets and consisting of fresh fruits and vegetables, lean meats such as fish and white chicken and whole grains. -labs, studies and vaccines per orders this encounter  Hyperlipidemia -reviewed labs with patient and discussed options -offered statin - she would perfer to try lifestyle changes and recheck -discussed lifestyle goals and advised follow up in 3-4 months  Prediabetes -lifestyle recs   No orders of the defined types were placed in this encounter.    Patient Instructions  -schedule mammoogram - call [phone]  -We recommend the following healthy lifestyle measures: - eat a healthy diet consisting of lots of vegetables, fruits, beans, nuts, seeds, healthy meats such as white chicken and fish and whole grains.  - avoid fried foods, fast food, processed foods, sodas, red meet and other fattening foods.  - get a least 150 minutes of aerobic exercise per week.   -We have ordered labs or studies at this visit. It can take up to 1-2 weeks for results and processing. We will contact you with instructions IF your results are abnormal. Normal results will be released to your [facility]. If you have not heard from [place] or can not find your results in [facility] in 2 weeks please contact our office.  -follow up in in 3-4 month in November for fasting labs and follow up      No Follow-up on file.  Kriste Basque R.

## 2012-09-02 NOTE — Progress Notes (Signed)
Quick Note:  Called and spoke with pt and pt is aware. ______ 

## 2012-12-26 ENCOUNTER — Other Ambulatory Visit: Payer: Self-pay

## 2013-01-01 ENCOUNTER — Ambulatory Visit: Payer: PRIVATE HEALTH INSURANCE | Admitting: Family Medicine

## 2013-01-10 ENCOUNTER — Ambulatory Visit: Payer: 59 | Admitting: Family Medicine

## 2013-03-07 ENCOUNTER — Encounter: Payer: No Typology Code available for payment source | Admitting: Family Medicine

## 2013-03-07 NOTE — Progress Notes (Signed)
Error   This encounter was created in error - please disregard. 

## 2013-05-13 ENCOUNTER — Other Ambulatory Visit: Payer: Self-pay

## 2013-05-13 DIAGNOSIS — Z1231 Encounter for screening mammogram for malignant neoplasm of breast: Secondary | ICD-10-CM

## 2013-05-22 ENCOUNTER — Ambulatory Visit: Payer: No Typology Code available for payment source

## 2013-05-27 ENCOUNTER — Ambulatory Visit
Admission: RE | Admit: 2013-05-27 | Discharge: 2013-05-27 | Disposition: A | Discharge status: Home / Self Care | Payer: 59 | Source: Ambulatory Visit

## 2013-05-27 DIAGNOSIS — Z1231 Encounter for screening mammogram for malignant neoplasm of breast: Secondary | ICD-10-CM

## 2013-12-01 ENCOUNTER — Encounter: Payer: Self-pay | Admitting: Gastroenterology

## 2013-12-03 DIAGNOSIS — H40033 Anatomical narrow angle, bilateral: Secondary | ICD-10-CM | POA: Insufficient documentation

## 2014-02-06 ENCOUNTER — Encounter: Payer: Self-pay | Admitting: Family Medicine

## 2014-02-06 ENCOUNTER — Ambulatory Visit (INDEPENDENT_AMBULATORY_CARE_PROVIDER_SITE_OTHER): Payer: 59 | Admitting: Family Medicine

## 2014-02-06 VITALS — BP 140/92 | HR 84 | Temp 98.3°F | Ht 65.5 in | Wt 171.8 lb

## 2014-02-06 DIAGNOSIS — IMO0001 Reserved for inherently not codable concepts without codable children: Secondary | ICD-10-CM

## 2014-02-06 DIAGNOSIS — Z0000 Encounter for general adult medical examination without abnormal findings: Secondary | ICD-10-CM

## 2014-02-06 DIAGNOSIS — E785 Hyperlipidemia, unspecified: Secondary | ICD-10-CM

## 2014-02-06 DIAGNOSIS — R739 Hyperglycemia, unspecified: Secondary | ICD-10-CM

## 2014-02-06 DIAGNOSIS — R030 Elevated blood-pressure reading, without diagnosis of hypertension: Secondary | ICD-10-CM

## 2014-02-06 LAB — LIPID PANEL
CHOLESTEROL: 250 mg/dL — AB (ref 0–200)
HDL: 49.8 mg/dL (ref >39.00)
LDL Cholesterol: 174 mg/dL — ABNORMAL HIGH (ref 0–99)
NONHDL: 200.2
Total CHOL/HDL Ratio: 5
Triglycerides: 129 mg/dL (ref 0.0–149.0)
VLDL: 25.8 mg/dL (ref 0.0–40.0)

## 2014-02-06 LAB — BASIC METABOLIC PANEL
BUN: 19 mg/dL (ref 6–23)
CALCIUM: 9.2 mg/dL (ref 8.4–10.5)
CO2: 24 mEq/L (ref 19–32)
Chloride: 105 mEq/L (ref 96–112)
Creatinine, Ser: 1 mg/dL (ref 0.4–1.2)
GFR: 63.56 mL/min (ref >60.00)
GLUCOSE: 97 mg/dL (ref 70–99)
Potassium: 3.8 mEq/L (ref 3.5–5.1)
SODIUM: 137 meq/L (ref 135–145)

## 2014-02-06 LAB — HEMOGLOBIN A1C: HEMOGLOBIN A1C: 5.7 % (ref 4.6–6.5)

## 2014-02-06 NOTE — Patient Instructions (Signed)
BEFORE YOU LEAVE: -labs -schedule follow up in 1-2 months   -get your flu vaccine  -We have ordered labs or studies at this visit. It can take up to 1-2 weeks for results and processing. We will contact you with instructions IF your results are abnormal. Normal results will be released to your [facility]. If you have not heard from [place] or can not find your results in [facility] in 2 weeks please contact our office.   We recommend the following healthy lifestyle measures: - eat a healthy diet consisting of lots of vegetables, fruits, beans, nuts, seeds, healthy meats such as white chicken and fish and whole grains.  - avoid fried foods, fast food, processed foods, sodas, red meet and other fattening foods.  - get a least 150 minutes of aerobic exercise per week.

## 2014-02-06 NOTE — Progress Notes (Signed)
Pre visit review using our clinic review tool, if applicable. No additional management support is needed unless otherwise documented below in the visit note. 

## 2014-02-06 NOTE — Progress Notes (Signed)
HPI:  Here for CPE:  -Concerns and/or follow up today:  Note: she was told to f/u 3 months after her last visit in 08/2012 - it has been almost 1.5 years.  1-2)Prediabetes/HLD: -on last labs -lifestyle recs advised - she reports has not been good with diet and exercise - willing to start working on this  3)Hx HTN: -on antihypertensive in the past -denies: CP, SOB, DOE  -Diet: Poor, lots of junk food   -Exercise: no regular exercise  -Taking folic acid, vitamin D or calcium: no  -Diabetes and Dyslipidemia Screening: FASTING  -Hx of HTN: no  -Vaccines: UTD  -pap history: she declined pap today - wants to do next year, neg hpv and neg pap last visit - but adequacy of transition zone not definite  -FDLMP: n/a  -sexual activity: yes, female partner, no new partners  -wants STI testing: no  -FH breast, colon or ovarian ca: see FH Last mammogram:  UTD Last colon cancer screening:UTD  Breast Ca Risk Assessment: n/a   -Alcohol, Tobacco, drug use: see social history  Review of Systems - no fevers, unintentional weight loss, vision loss, hearing loss, chest pain, sob, hemoptysis, melena, hematochezia, hematuria, genital discharge, changing or concerning skin lesions, bleeding, bruising, loc, thoughts of self harm or SI  Past Medical History  Diagnosis Date  . Hyperlipidemia   . Hypertension   . Chicken pox   . UTI (urinary tract infection)   . Colon polyp     No past surgical history on file.  Family History  Problem Relation Age of Onset  . Heart disease Mother 91    CAD  . Hypertension Mother   . Heart disease Father 20    MI  . COPD Father   . Cancer Maternal Uncle     colon, pancreatic  . Cancer Maternal Grandfather     colon, pancreatic    History   Social History  . Marital Status: Married    Spouse Name: N/A    Number of Children: N/A  . Years of Education: N/A   Social History Main Topics  . Smoking status: Never Smoker   . Smokeless  tobacco: None  . Alcohol Use: Yes     Comment: a glass of wine with dinner   . Drug Use: None  . Sexual Activity: None   Other Topics Concern  . None   Social History Narrative   Work or School: works as Water quality scientist, office work      Home Situation: lives with husband and handicapped adult son      Spiritual Beliefs: Christian      Lifestyle: going to the gym 3-4 days per week, working on diet - portion sizes are an issue             Current outpatient prescriptions: aspirin 81 MG tablet, Take 81 mg by mouth daily., Disp: , Rfl:   EXAM:  Filed Vitals:   02/06/14 0819  BP: 140/92  Pulse: 84  Temp: 98.3 F (36.8 C)  Body mass index is 28.14 kg/(m^2).   GENERAL: vitals reviewed and listed below, alert, oriented, appears well hydrated and in no acute distress  HEENT: head atraumatic, PERRLA, normal appearance of eyes, ears, nose and mouth. moist mucus membranes.  NECK: supple, no masses or lymphadenopathy  LUNGS: clear to auscultation bilaterally, no rales, rhonchi or wheeze  CV: HRRR, no peripheral edema or cyanosis, normal pedal pulses  BREAST: declined  ABDOMEN: bowel sounds normal, soft, non  tender to palpation, no masses, no rebound or guarding  GU: declined  RECTAL: refused  SKIN: no rash or abnormal lesions  MS: normal gait, moves all extremities normally  NEURO: CN II-XII grossly intact, normal muscle strength and sensation to light touch on extremities  PSYCH: normal affect, pleasant and cooperative  ASSESSMENT AND PLAN:  Discussed the following assessment and plan:  Visit for preventive health examination - Plan: Basic metabolic panel  Hyperlipemia - Plan: Lipid Panel  Hyperglycemia - Plan: Hemoglobin A1c  Elevated blood pressure   -Discussed and advised all [place] preventive services health task force level A and B recommendations for age, sex and risks.  -Advised at least 150 minutes of exercise per week and a healthy diet low in  saturated fats and sweets and consisting of fresh fruits and vegetables, lean meats such as fish and white chicken and whole grains.  -FASTING labs, studies and vaccines per orders this encounter  Orders Placed This Encounter  Procedures  . Basic metabolic panel  . Lipid Panel  . Hemoglobin A1c   -discussed options for tx of BP - she opted to do trial low sodium and exercise - advised really needs close follow up to recheck.  -she declined pap today after discussion results - advised to do, she doe not wish to today and agrees to do next year.  Patient advised to return to clinic immediately if symptoms worsen or persist or new concerns.  Patient Instructions  BEFORE YOU LEAVE: -labs -schedule follow up in 1-2 months   -get your flu vaccine  -We have ordered labs or studies at this visit. It can take up to 1-2 weeks for results and processing. We will contact you with instructions IF your results are abnormal. Normal results will be released to your [facility]. If you have not heard from [place] or can not find your results in [facility] in 2 weeks please contact our office.   We recommend the following healthy lifestyle measures: - eat a healthy diet consisting of lots of vegetables, fruits, beans, nuts, seeds, healthy meats such as white chicken and fish and whole grains.  - avoid fried foods, fast food, processed foods, sodas, red meet and other fattening foods.  - get a least 150 minutes of aerobic exercise per week.      No Follow-up on file.  Colin Benton R.

## 2014-04-07 ENCOUNTER — Ambulatory Visit (INDEPENDENT_AMBULATORY_CARE_PROVIDER_SITE_OTHER): Payer: Managed Care, Other (non HMO) | Admitting: Family Medicine

## 2014-04-07 ENCOUNTER — Encounter: Payer: Self-pay | Admitting: Family Medicine

## 2014-04-07 VITALS — BP 140/92 | HR 84 | Temp 98.9°F | Ht 65.5 in | Wt 160.8 lb

## 2014-04-07 DIAGNOSIS — E785 Hyperlipidemia, unspecified: Secondary | ICD-10-CM

## 2014-04-07 DIAGNOSIS — I10 Essential (primary) hypertension: Secondary | ICD-10-CM

## 2014-04-07 DIAGNOSIS — R7303 Prediabetes: Secondary | ICD-10-CM

## 2014-04-07 DIAGNOSIS — R7309 Other abnormal glucose: Secondary | ICD-10-CM

## 2014-04-07 NOTE — Patient Instructions (Signed)
BEFORE YOU LEAVE: -follow up appointment in 2 months - come fasting, drink plenty of water  We recommend the following healthy lifestyle measures: - eat a healthy diet consisting of lots of vegetables, fruits, beans, nuts, seeds, healthy meats such as white chicken and fish and whole grains.  - avoid fried foods, fast food, processed foods, sodas, red meet and other fattening foods.  - get a least 150 minutes of aerobic exercise per week.

## 2014-04-07 NOTE — Progress Notes (Signed)
HPI:  Dana Hodge is a pleasant 64 yo F, but unfortunately with poor compliance with treatment, follow up and preventive recommendations. She is her for follow up for several problems:  1-2)Prediabetes/HLD: -lifestyle recs advised in the past and consider cholesterol medication recent labs  -reports: has really been working on her diet - changed her diet dramatically -denies: polyuria, polydipsia, vision changes, hx reaction to statin  3)Hx HTN: -on antihypertensive in the past, declined restarting antihypertensive last visit -reports: working really hard on her diet, plans to start exercising -denies: CP, SOB, DOE   ROS: See pertinent positives and negatives per HPI.  Past Medical History  Diagnosis Date  . Hyperlipidemia   . Hypertension   . Chicken pox   . UTI (urinary tract infection)   . Colon polyp     No past surgical history on file.  Family History  Problem Relation Age of Onset  . Heart disease Mother 58    CAD  . Hypertension Mother   . Heart disease Father 90    MI  . COPD Father   . Cancer Maternal Uncle     colon, pancreatic  . Cancer Maternal Grandfather     colon, pancreatic    History   Social History  . Marital Status: Married    Spouse Name: N/A  . Number of Children: N/A  . Years of Education: N/A   Social History Main Topics  . Smoking status: Never Smoker   . Smokeless tobacco: Not on file  . Alcohol Use: Yes     Comment: a glass of wine with dinner   . Drug Use: Not on file  . Sexual Activity: Not on file   Other Topics Concern  . None   Social History Narrative   Work or School: works as Water quality scientist, office work      Home Situation: lives with husband and handicapped adult son      Spiritual Beliefs: Christian      Lifestyle: going to the gym 3-4 days per week, working on diet - portion sizes are an issue              Current outpatient prescriptions:  .  aspirin 81 MG tablet, Take 81 mg by mouth daily., Disp:  , Rfl:   EXAM:  Filed Vitals:   04/07/14 1528  BP: 140/92  Pulse: 84  Temp: 98.9 F (37.2 C)    Body mass index is 26.34 kg/(m^2).  GENERAL: vitals reviewed and listed above, alert, oriented, appears well hydrated and in no acute distress  HEENT: atraumatic, conjunttiva clear, no obvious abnormalities on inspection of external nose and ears  NECK: no obvious masses on inspection  LUNGS: clear to auscultation bilaterally, no wheezes, rales or rhonchi, good air movement  CV: HRRR, no peripheral edema  MS: moves all extremities without noticeable abnormality  PSYCH: pleasant and cooperative, no obvious depression or anxiety  ASSESSMENT AND PLAN:  Discussed the following assessment and plan:  Hyperlipemia -advised statin -she is working on diet and has lost 10 lbs and is going to start exercising -she declined starting medication - but agrees to recheck in 2 months  Essential hypertension -discussed risks -advised starting medication -she wants to continue diet and add exercise  Prediabetes -monitor  -Patient advised to return or notify a doctor immediately if symptoms worsen or persist or new concerns arise.  There are no Patient Instructions on file for this visit.   Colin Benton R.

## 2014-04-07 NOTE — Progress Notes (Signed)
Pre visit review using our clinic review tool, if applicable. No additional management support is needed unless otherwise documented below in the visit note. 

## 2014-04-08 ENCOUNTER — Telehealth: Payer: Self-pay | Admitting: Family Medicine

## 2014-04-08 NOTE — Telephone Encounter (Signed)
emmi emailed °

## 2014-04-24 ENCOUNTER — Telehealth: Payer: Self-pay | Admitting: Family Medicine

## 2014-04-24 NOTE — Telephone Encounter (Signed)
Pt call and stated she came to see Dr. Maudie Mercury in Dec and gave her a Health Ownership Form to complete . She stated that her Insurance Co haven't received that form.Pt would want a call back.

## 2014-04-27 NOTE — Telephone Encounter (Signed)
I called the pt and she described the form to me and I found this.  I advised her there was no name at the top and apologized for any inconveniences. The form was completed and faxed to her at [phone] and also to [employer] at [phone].

## 2014-06-05 ENCOUNTER — Ambulatory Visit (INDEPENDENT_AMBULATORY_CARE_PROVIDER_SITE_OTHER): Payer: 59 | Admitting: Family Medicine

## 2014-06-05 ENCOUNTER — Encounter: Payer: Self-pay | Admitting: Family Medicine

## 2014-06-05 VITALS — BP 136/92 | HR 88 | Temp 98.3°F | Ht 64.75 in | Wt 153.9 lb

## 2014-06-05 DIAGNOSIS — E785 Hyperlipidemia, unspecified: Secondary | ICD-10-CM | POA: Diagnosis not present

## 2014-06-05 DIAGNOSIS — I10 Essential (primary) hypertension: Secondary | ICD-10-CM | POA: Diagnosis not present

## 2014-06-05 DIAGNOSIS — R7303 Prediabetes: Secondary | ICD-10-CM

## 2014-06-05 DIAGNOSIS — R7309 Other abnormal glucose: Secondary | ICD-10-CM

## 2014-06-05 DIAGNOSIS — Z23 Encounter for immunization: Secondary | ICD-10-CM | POA: Diagnosis not present

## 2014-06-05 LAB — LIPID PANEL
CHOLESTEROL: 285 mg/dL — AB (ref 0–200)
HDL: 58.7 mg/dL (ref >39.00)
LDL Cholesterol: 206 mg/dL — ABNORMAL HIGH (ref 0–99)
NONHDL: 226.3
Total CHOL/HDL Ratio: 5
Triglycerides: 100 mg/dL (ref 0.0–149.0)
VLDL: 20 mg/dL (ref 0.0–40.0)

## 2014-06-05 MED ORDER — PRAVASTATIN SODIUM 20 MG PO TABS: 20.0000 mg | ORAL_TABLET | Freq: Every day | ORAL | Status: DC

## 2014-06-05 MED ORDER — PRAVASTATIN SODIUM 40 MG PO TABS: 40.0000 mg | ORAL_TABLET | Freq: Every day | ORAL | Status: DC

## 2014-06-05 NOTE — Patient Instructions (Addendum)
BEFORE YOU LEAVE: -labs -schedule follow up in 3 months -shingles vaccine  We recommend the following healthy lifestyle measures: - eat a healthy diet consisting of lots of vegetables, fruits, beans, nuts, seeds, healthy meats such as white chicken and fish and whole grains.  - avoid fried foods, fast food, processed foods, sodas, red meet and other fattening foods.  - get a least 150 minutes of aerobic exercise per week.

## 2014-06-05 NOTE — Addendum Note (Signed)
Addended by: Agnes Lawrence on: 06/05/2014 08:45 AM   Modules accepted: Orders

## 2014-06-05 NOTE — Progress Notes (Signed)
HPI:  Dana Hodge is a pleasant 64 yo F, but unfortunately with poor compliance with treatment, follow up and preventive recommendations. She is her for follow up for several problems:  1-2)Prediabetes/HLD: -lifestyle recs and statin advised - she refused a statinin the past -reports: has really been working on her diet - changed her diet dramatically; but has not been exercising -denies: polyuria, polydipsia, vision changes, hx reaction to statin  3)Hx HTN: -on antihypertensive in the past, advised restarting - she refused -reports: working really hard on her diet, plans to start exercising -denies: CP, SOB, DOE  ROS: See pertinent positives and negatives per HPI.  Past Medical History  Diagnosis Date  . Hyperlipidemia   . Hypertension   . Chicken pox   . UTI (urinary tract infection)   . Colon polyp     No past surgical history on file.  Family History  Problem Relation Age of Onset  . Heart disease Mother 44    CAD  . Hypertension Mother   . Heart disease Father 1    MI  . COPD Father   . Cancer Maternal Uncle     colon, pancreatic  . Cancer Maternal Grandfather     colon, pancreatic    History   Social History  . Marital Status: Married    Spouse Name: N/A  . Number of Children: N/A  . Years of Education: N/A   Social History Main Topics  . Smoking status: Never Smoker   . Smokeless tobacco: Not on file  . Alcohol Use: Yes     Comment: a glass of wine with dinner   . Drug Use: Not on file  . Sexual Activity: Not on file   Other Topics Concern  . Not on file   Social History Narrative   Work or School: works as Water quality scientist, office work      Home Situation: lives with husband and handicapped adult son      Spiritual Beliefs: Christian      Lifestyle: going to the gym 3-4 days per week, working on diet - portion sizes are an issue              Current outpatient prescriptions:  .  aspirin 81 MG tablet, Take 81 mg by mouth daily.,  Disp: , Rfl:   EXAM:  Filed Vitals:   06/05/14 0821  BP: 136/92    There is no weight on file to calculate BMI.  GENERAL: vitals reviewed and listed above, alert, oriented, appears well hydrated and in no acute distress  HEENT: atraumatic, conjunttiva clear, no obvious abnormalities on inspection of external nose and ears  NECK: no obvious masses on inspection  LUNGS: clear to auscultation bilaterally, no wheezes, rales or rhonchi, good air movement  CV: HRRR, no peripheral edema  MS: moves all extremities without noticeable abnormality  PSYCH: pleasant and cooperative, no obvious depression or anxiety  ASSESSMENT AND PLAN:  Discussed the following assessment and plan:  Hyperlipemia - Plan: Lipid panel  Essential hypertension  Prediabetes  -Patient advised to return or notify a doctor immediately if symptoms worsen or persist or new concerns arise.  Patient Instructions  BEFORE YOU LEAVE: -labs -schedule follow up in 3 months -shingles vaccine  We recommend the following healthy lifestyle measures: - eat a healthy diet consisting of lots of vegetables, fruits, beans, nuts, seeds, healthy meats such as white chicken and fish and whole grains.  - avoid fried foods, fast food, processed foods, sodas,  red meet and other fattening foods.  - get a least 150 minutes of aerobic exercise per week.        Colin Benton R.

## 2014-06-05 NOTE — Addendum Note (Signed)
Addended by: Agnes Lawrence on: 06/05/2014 01:38 PM   Modules accepted: Orders

## 2014-06-05 NOTE — Progress Notes (Signed)
Pre visit review using our clinic review tool, if applicable. No additional management support is needed unless otherwise documented below in the visit note. 

## 2014-06-26 ENCOUNTER — Emergency Department (INDEPENDENT_AMBULATORY_CARE_PROVIDER_SITE_OTHER): Payer: 59

## 2014-06-26 ENCOUNTER — Encounter (HOSPITAL_COMMUNITY): Payer: Self-pay | Admitting: *Deleted

## 2014-06-26 ENCOUNTER — Emergency Department (HOSPITAL_COMMUNITY)
Admission: EM | Admit: 2014-06-26 | Discharge: 2014-06-26 | Disposition: A | Discharge status: Home / Self Care | Payer: 59 | Source: Home / Self Care | Attending: Family Medicine | Admitting: Family Medicine

## 2014-06-26 DIAGNOSIS — S93401A Sprain of unspecified ligament of right ankle, initial encounter: Secondary | ICD-10-CM

## 2014-06-26 NOTE — Discharge Instructions (Signed)
Wear ankle support as needed for comfort, activity as tolerated. advil and warm soak as needed, return or see orthopedist if further problems.

## 2014-06-26 NOTE — ED Notes (Signed)
Pt is here with right ankle injury. Pt fell on Saturday, April 30th. Ankle is swollen and bruised.

## 2014-06-26 NOTE — ED Provider Notes (Signed)
CSN: [id number]     Arrival date & time 06/26/14  1638 History   First MD Initiated Contact with Patient 06/26/14 1741     Chief Complaint  Patient presents with  . Ankle Pain   (Consider location/radiation/quality/duration/timing/severity/associated sxs/prior Treatment) Patient is a 64 y.o. female presenting with ankle pain. The history is provided by the patient.  Ankle Pain Location:  Ankle Time since incident:  6 days Injury: yes   Mechanism of injury comment:  [place] and [place] ankle, still sore and swollen and ecchymotic Ankle location:  R ankle Pain details:    Radiates to:  Does not radiate   Severity:  Mild   Onset quality:  Sudden   Progression:  Worsening Chronicity:  New Dislocation: no   Prior injury to area:  No Relieved by:  None tried Worsened by:  Nothing tried   Past Medical History  Diagnosis Date  . Hyperlipidemia   . Hypertension   . Chicken pox   . UTI (urinary tract infection)   . Colon polyp    History reviewed. No pertinent past surgical history. Family History  Problem Relation Age of Onset  . Heart disease Mother 33    CAD  . Hypertension Mother   . Heart disease Father 69    MI  . COPD Father   . Cancer Maternal Uncle     colon, pancreatic  . Cancer Maternal Grandfather     colon, pancreatic   History  Substance Use Topics  . Smoking status: Never Smoker   . Smokeless tobacco: Not on file  . Alcohol Use: Yes     Comment: a glass of wine with dinner    OB History    No data available     Review of Systems  Gastrointestinal: Negative.   Musculoskeletal: Positive for joint swelling and gait problem.  Skin: Negative.     Allergies  Review of patient's allergies indicates no known allergies.  Home Medications   Prior to Admission medications   Medication Sig Start Date End Date Taking? Authorizing Provider  aspirin 81 MG tablet Take 81 mg by mouth daily.    Historical Provider, MD  pravastatin  (PRAVACHOL) 20 MG tablet Take 1 tablet (20 mg total) by mouth daily. For 2 weeks 06/05/14   [name], DO  pravastatin (PRAVACHOL) 40 MG tablet Take 1 tablet (40 mg total) by mouth daily. 06/05/14   [name], DO   BP 176/91 mmHg  Pulse 86  Temp(Src) 98.1 F (36.7 C) (Oral)  Resp 16 Physical Exam  Constitutional: She is oriented to person, place, and time. She appears well-developed and well-nourished. No distress.  HENT:  Head: Normocephalic.  Neck: Normal range of motion. Neck supple.  Musculoskeletal: She exhibits tenderness.       Right ankle: She exhibits decreased range of motion, swelling and ecchymosis. She exhibits no deformity, no laceration and normal pulse. Tenderness. Lateral malleolus tenderness found. No head of 5th metatarsal and no proximal fibula tenderness found. Achilles tendon normal.  Lymphadenopathy:    She has no cervical adenopathy.  Neurological: She is alert and oriented to person, place, and time.  Skin: Skin is warm and dry.  Nursing note and vitals reviewed.   ED Course  Procedures (including critical care time) Labs Review Labs Reviewed - No data to display  Imaging Review Dg Ankle Complete Right  06/26/2014   CLINICAL DATA:  Twisting injury with ankle pain, initial encounter  EXAM:  RIGHT ANKLE - COMPLETE 3+ VIEW  COMPARISON:  None.  FINDINGS: Generalized soft tissue swelling is identified. No acute fracture or dislocation is seen.  IMPRESSION: Soft tissue swelling without acute bony abnormality.   Electronically Signed   By: Inez Catalina M.D.   On: 06/26/2014 18:24     MDM   1. Ankle sprain, right, initial encounter        Billy Fischer, MD 06/26/14 [phone]

## 2014-07-22 ENCOUNTER — Telehealth: Payer: Self-pay | Admitting: Family Medicine

## 2014-07-22 MED ORDER — PRAVASTATIN SODIUM 20 MG PO TABS: ORAL_TABLET | ORAL | Status: DC

## 2014-07-22 NOTE — Telephone Encounter (Signed)
Ok to send 20mg , 2 tablets daily by mouth. Thanks. Please remove any other past prescriptions. Thanks. #60, 3 refills

## 2014-07-22 NOTE — Telephone Encounter (Signed)
Rx done and changed on med list.

## 2014-07-22 NOTE — Telephone Encounter (Signed)
Pt does not want to take pravachol 40 mg the pill is too big. Pt would like to take pravachol 20 mg twice a day. [facility] [place]

## 2014-09-07 ENCOUNTER — Ambulatory Visit: Payer: 59 | Admitting: Family Medicine

## 2014-10-09 ENCOUNTER — Ambulatory Visit: Payer: 59 | Admitting: Family Medicine

## 2014-12-11 ENCOUNTER — Ambulatory Visit (INDEPENDENT_AMBULATORY_CARE_PROVIDER_SITE_OTHER): Payer: 59 | Admitting: Family Medicine

## 2014-12-11 ENCOUNTER — Encounter: Payer: Self-pay | Admitting: Family Medicine

## 2014-12-11 VITALS — BP 142/96 | HR 75 | Temp 98.0°F | Wt 172.0 lb

## 2014-12-11 DIAGNOSIS — F320 Major depressive disorder, single episode, mild: Secondary | ICD-10-CM

## 2014-12-11 DIAGNOSIS — E785 Hyperlipidemia, unspecified: Secondary | ICD-10-CM

## 2014-12-11 DIAGNOSIS — I10 Essential (primary) hypertension: Secondary | ICD-10-CM | POA: Diagnosis not present

## 2014-12-11 MED ORDER — HYDROCHLOROTHIAZIDE 25 MG PO TABS: 25.0000 mg | ORAL_TABLET | Freq: Every day | ORAL | Status: DC

## 2014-12-11 MED ORDER — LOVASTATIN 20 MG PO TABS: 20.0000 mg | ORAL_TABLET | Freq: Every day | ORAL | Status: DC

## 2014-12-11 NOTE — Progress Notes (Signed)
HPI:  Dana Hodge is a very pleasant 64 yo F, but unfortunately with poor compliance with treatment, follow up and preventive recommendations. Her last visit was 6 months ago, we had advised a 3 month follow up.   1-2)Prediabetes/HLD/Obesity: - advised lifestyle changes and to start statin after last set of labs -reports: taking lower dose of pravastatin due to cramps and is doing better - want lovastatin as is cheaper with her insuranc; no exercise, diet is poor -denies: polyuria, polydipsia, vision changes, hx reaction to statin -wt has fluctuated, but is about 2 lbs up from visit about 1 year ago  3)Hx HTN: -on antihypertensive in the past, advised restarting - she refused -reports: she was planning to treat with lifestyle changes at her last visit but reports has not made lifestyle changes and that was why she did not follow up -denies: CP, SOB, DOE  4) Depressed mood: -chronic mild depression, anhedonia, poor motivation, stress eating, sad, worsening lately related to marital stress -husband is negative, no SI  ROS: See pertinent positives and negatives per HPI.  Past Medical History  Diagnosis Date  . Hyperlipidemia   . Hypertension   . Chicken pox   . UTI (urinary tract infection)   . Colon polyp     No past surgical history on file.  Family History  Problem Relation Age of Onset  . Heart disease Mother 51    CAD  . Hypertension Mother   . Heart disease Father 58    MI  . COPD Father   . Cancer Maternal Uncle     colon, pancreatic  . Cancer Maternal Grandfather     colon, pancreatic    Social History   Social History  . Marital Status: Married    Spouse Name: N/A  . Number of Children: N/A  . Years of Education: N/A   Social History Main Topics  . Smoking status: Never Smoker   . Smokeless tobacco: Not on file  . Alcohol Use: Yes     Comment: a glass of wine with dinner   . Drug Use: No  . Sexual Activity: Yes    Birth Control/ Protection: None    Other Topics Concern  . Not on file   Social History Narrative   Work or School: works as Water quality scientist, office work      Home Situation: lives with husband and handicapped adult son      Spiritual Beliefs: Christian      Lifestyle: going to the gym 3-4 days per week, working on diet - portion sizes are an issue              Current outpatient prescriptions:  .  aspirin 81 MG tablet, Take 81 mg by mouth daily., Disp: , Rfl:  .  hydrochlorothiazide (HYDRODIURIL) 25 MG tablet, Take 1 tablet (25 mg total) by mouth daily., Disp: 90 tablet, Rfl: 3 .  lovastatin (MEVACOR) 20 MG tablet, Take 1 tablet (20 mg total) by mouth at bedtime., Disp: 90 tablet, Rfl: 3  EXAM:  Filed Vitals:   12/11/14 1551  BP: 140/92  Pulse: 75  Temp: 98 F (36.7 C)    Body mass index is 28.83 kg/(m^2).  GENERAL: vitals reviewed and listed above, alert, oriented, appears well hydrated and in no acute distress  HEENT: atraumatic, conjunttiva clear, no obvious abnormalities on inspection of external nose and ears  NECK: no obvious masses on inspection  LUNGS: clear to auscultation bilaterally, no wheezes, rales or  rhonchi, good air movement  CV: HRRR, no peripheral edema  MS: moves all extremities without noticeable abnormality  PSYCH: pleasant and cooperative, no obvious depression or anxiety  ASSESSMENT AND PLAN:  Discussed the following assessment and plan:  Mild single current episode of major depressive disorder ([place]) -discussed at length and offered help and advise follow up/emergency care if worsening or severe -she wants to avoid medications -opted to try CBT (number provided to schedule) and exercise  Hyperlipemia -change to lovastatin, she is considering [facility] theory and decided to stop there on the way home from her visit  Essential hypertension -she agreed to start medication and prefers a diuretic, risks discussed  Follow up in 1 month -Patient advised to return or  notify a doctor immediately if symptoms worsen or persist or new concerns arise.  Patient Instructions  Schedule follow up in 1 month  Call today to set up counseling with Dr. Glennon Hamilton  Call to [facility] the hydrochlorothiazide 25mg  daily  I will send the other cholesterol medication to your phyarmacy     [name].

## 2014-12-11 NOTE — Patient Instructions (Signed)
Schedule follow up in 1 month  Call today to set up counseling with Dr. Glennon Hamilton  Call to [facility] the hydrochlorothiazide 25mg  daily  I will send the other cholesterol medication to your phyarmacy

## 2015-01-08 ENCOUNTER — Ambulatory Visit (INDEPENDENT_AMBULATORY_CARE_PROVIDER_SITE_OTHER): Payer: 59 | Admitting: Family Medicine

## 2015-01-08 ENCOUNTER — Encounter: Payer: Self-pay | Admitting: Family Medicine

## 2015-01-08 VITALS — BP 126/82 | HR 80 | Temp 98.4°F | Ht 64.75 in | Wt 168.9 lb

## 2015-01-08 DIAGNOSIS — R0789 Other chest pain: Secondary | ICD-10-CM | POA: Diagnosis not present

## 2015-01-08 DIAGNOSIS — Z23 Encounter for immunization: Secondary | ICD-10-CM

## 2015-01-08 DIAGNOSIS — K219 Gastro-esophageal reflux disease without esophagitis: Secondary | ICD-10-CM

## 2015-01-08 DIAGNOSIS — F325 Major depressive disorder, single episode, in full remission: Secondary | ICD-10-CM | POA: Diagnosis not present

## 2015-01-08 DIAGNOSIS — E785 Hyperlipidemia, unspecified: Secondary | ICD-10-CM | POA: Diagnosis not present

## 2015-01-08 DIAGNOSIS — I10 Essential (primary) hypertension: Secondary | ICD-10-CM | POA: Diagnosis not present

## 2015-01-08 NOTE — Progress Notes (Signed)
HPI:  Dana Hodge is a very pleasant 64 yo F, but unfortunately with poor compliance with treatment, follow up and preventive recommendations. Her last visit was 6 months ago, we had advised a 3 month follow up.   1-2)Prediabetes/HLD/Obesity: - advised lifestyle changes and to start statin last visit -denies: polyuria, polydipsia, vision changes, hx reaction to statin  3)Hx HTN: -on antihypertensive in the past, advised restarting last visit with hctz -reports: she was planning to treat with lifestyle changes at her last visit but reports has not made lifestyle changes and that was why she did not follow up -denies: CP, SOB, DOE  4) Depressed mood: -advised CBT last visit - reports is much better since started exercising, did not do CBT -chronic mild depression, anhedonia, poor motivation, stress eating, sad, worsening lately related to marital stress -husband is negative, no SI  5)GERD: -mild nausea, heartburrn, acid reflux -symptoms occur after meals and when lies down at night, never with exercise - has had 3 episodes of "squeezing: CP with this -denies: dysphagia, DOE, SOB, CP with activity, vomiting, change in bowels, weight loss, fatigue  ROS: See pertinent positives and negatives per HPI.  Past Medical History  Diagnosis Date  . Hyperlipidemia   . Hypertension   . Chicken pox   . UTI (urinary tract infection)   . Colon polyp     No past surgical history on file.  Family History  Problem Relation Age of Onset  . Heart disease Mother 45    CAD  . Hypertension Mother   . Heart disease Father 47    MI  . COPD Father   . Cancer Maternal Uncle     colon, pancreatic  . Cancer Maternal Grandfather     colon, pancreatic    Social History   Social History  . Marital Status: Married    Spouse Name: N/A  . Number of Children: N/A  . Years of Education: N/A   Social History Main Topics  . Smoking status: Never Smoker   . Smokeless tobacco: None  . Alcohol  Use: Yes     Comment: a glass of wine with dinner   . Drug Use: No  . Sexual Activity: Yes    Birth Control/ Protection: None   Other Topics Concern  . None   Social History Narrative   Work or School: works as Water quality scientist, office work      Home Situation: lives with husband and handicapped adult son      Spiritual Beliefs: Christian      Lifestyle: going to the gym 3-4 days per week, working on diet - portion sizes are an issue              Current outpatient prescriptions:  .  aspirin 81 MG tablet, Take 81 mg by mouth daily., Disp: , Rfl:  .  hydrochlorothiazide (HYDRODIURIL) 25 MG tablet, Take 1 tablet (25 mg total) by mouth daily., Disp: 90 tablet, Rfl: 3 .  lovastatin (MEVACOR) 20 MG tablet, Take 1 tablet (20 mg total) by mouth at bedtime., Disp: 90 tablet, Rfl: 3 .  MAGNESIUM PO, Take by mouth., Disp: , Rfl:  .  Methylcellulose, Laxative, (CITRUCEL PO), Take by mouth., Disp: , Rfl:   EXAM:  Filed Vitals:   01/08/15 1609  BP: 126/82  Pulse: 80  Temp: 98.4 F (36.9 C)    Body mass index is 28.31 kg/(m^2).  GENERAL: vitals reviewed and listed above, alert, oriented, appears well hydrated and  in no acute distress  HEENT: atraumatic, conjunttiva clear, no obvious abnormalities on inspection of external nose and ears  NECK: no obvious masses on inspection  LUNGS: clear to auscultation bilaterally, no wheezes, rales or rhonchi, good air movement  CV: HRRR, no peripheral edema  ABD: soft, NTTP  MS: moves all extremities without noticeable abnormality  PSYCH: pleasant and cooperative, no obvious depression or anxiety  ASSESSMENT AND PLAN:  Discussed the following assessment and plan:  Gastroesophageal reflux disease, esophagitis presence not specified  Hyperlipemia  Essential hypertension  Major depressive disorder with single episode, in full remission ([place])  Atypical chest pain - Plan: Exercise Tolerance Test, EKG 12-Lead  -BP looks  good -prilosec for two weeks for GERD - advised follow up if persists after treatment -EKG w/ NSR - done  for atypical CP, likely GERD, and referred for exercise stress test- return and emergency precautions in interim -BP good on recheck -mood has improved -Patient advised to return or notify a doctor immediately if symptoms worsen or persist or new concerns arise.  Patient Instructions  BEFORE YOU LEAVE: -flu shot -appointment for exercise stress test -EKG -follow up in 3 months  Start prilosec 20 mg daily for 2 weeks - call me if symptoms persist  Take blood pressure medication in the morning  We placed a referral for you as discussed for the stress test. It usually takes about 1-2 weeks to process and schedule this referral. If you have not heard from [place] regarding this appointment in 2 weeks please contact our office.    Food Choices for Gastroesophageal Reflux Disease, Adult When you have gastroesophageal reflux disease (GERD), the foods you eat and your eating habits are very important. Choosing the right foods can help ease the discomfort of GERD. WHAT GENERAL GUIDELINES DO I NEED TO FOLLOW?  Choose fruits, vegetables, whole grains, low-fat dairy products, and low-fat meat, fish, and poultry.  Limit fats such as oils, salad dressings, butter, nuts, and avocado.  Keep a food diary to identify foods that cause symptoms.  Avoid foods that cause reflux. These may be different for different people.  Eat frequent small meals instead of three large meals each day.  Eat your meals slowly, in a relaxed setting.  Limit fried foods.  Cook foods using methods other than frying.  Avoid drinking alcohol.  Avoid drinking large amounts of liquids with your meals.  Avoid bending over or lying down until 2-3 hours after eating. WHAT FOODS ARE NOT RECOMMENDED? The following are some foods and drinks that may worsen your symptoms: Vegetables Tomatoes. Tomato juice. Tomato and  spaghetti sauce. Chili peppers. Onion and garlic. Horseradish. Fruits Oranges, grapefruit, and lemon (fruit and juice). Meats High-fat meats, fish, and poultry. This includes hot dogs, ribs, ham, sausage, salami, and bacon. Dairy Whole milk and chocolate milk. Sour cream. Cream. Butter. Ice cream. Cream cheese.  Beverages Coffee and tea, with or without caffeine. Carbonated beverages or energy drinks. Condiments Hot sauce. Barbecue sauce.  Sweets/Desserts Chocolate and cocoa. Donuts. Peppermint and spearmint. Fats and Oils High-fat foods, including [place] fries and potato chips. Other Vinegar. Strong spices, such as black pepper, white pepper, red pepper, cayenne, curry powder, cloves, ginger, and chili powder. The items listed above may not be a complete list of foods and beverages to avoid. Contact your dietitian for more information.   This information is not intended to replace advice given to you by your health care provider. Make sure you discuss any questions you have  with your health care provider.   Document Released: 02/06/2005 Document Revised: 02/27/2014 Document Reviewed: 12/11/2012 Elsevier Interactive Patient Education 2016 [place], [place]

## 2015-01-08 NOTE — Progress Notes (Signed)
Pre visit review using our clinic review tool, if applicable. No additional management support is needed unless otherwise documented below in the visit note. 

## 2015-01-08 NOTE — Patient Instructions (Addendum)
BEFORE YOU LEAVE: -flu shot -appointment for exercise stress test -EKG -follow up in 3 months  Start prilosec 20 mg daily for 2 weeks - call me if symptoms persist  Take blood pressure medication in the morning  We placed a referral for you as discussed for the stress test. It usually takes about 1-2 weeks to process and schedule this referral. If you have not heard from [place] regarding this appointment in 2 weeks please contact our office.    Food Choices for Gastroesophageal Reflux Disease, Adult When you have gastroesophageal reflux disease (GERD), the foods you eat and your eating habits are very important. Choosing the right foods can help ease the discomfort of GERD. WHAT GENERAL GUIDELINES DO I NEED TO FOLLOW?  Choose fruits, vegetables, whole grains, low-fat dairy products, and low-fat meat, fish, and poultry.  Limit fats such as oils, salad dressings, butter, nuts, and avocado.  Keep a food diary to identify foods that cause symptoms.  Avoid foods that cause reflux. These may be different for different people.  Eat frequent small meals instead of three large meals each day.  Eat your meals slowly, in a relaxed setting.  Limit fried foods.  Cook foods using methods other than frying.  Avoid drinking alcohol.  Avoid drinking large amounts of liquids with your meals.  Avoid bending over or lying down until 2-3 hours after eating. WHAT FOODS ARE NOT RECOMMENDED? The following are some foods and drinks that may worsen your symptoms: Vegetables Tomatoes. Tomato juice. Tomato and spaghetti sauce. Chili peppers. Onion and garlic. Horseradish. Fruits Oranges, grapefruit, and lemon (fruit and juice). Meats High-fat meats, fish, and poultry. This includes hot dogs, ribs, ham, sausage, salami, and bacon. Dairy Whole milk and chocolate milk. Sour cream. Cream. Butter. Ice cream. Cream cheese.  Beverages Coffee and tea, with or without caffeine. Carbonated beverages or  energy drinks. Condiments Hot sauce. Barbecue sauce.  Sweets/Desserts Chocolate and cocoa. Donuts. Peppermint and spearmint. Fats and Oils High-fat foods, including [place] fries and potato chips. Other Vinegar. Strong spices, such as black pepper, white pepper, red pepper, cayenne, curry powder, cloves, ginger, and chili powder. The items listed above may not be a complete list of foods and beverages to avoid. Contact your dietitian for more information.   This information is not intended to replace advice given to you by your health care provider. Make sure you discuss any questions you have with your health care provider.   Document Released: 02/06/2005 Document Revised: 02/27/2014 Document Reviewed: 12/11/2012 Elsevier Interactive Patient Education [employer].

## 2015-01-29 ENCOUNTER — Encounter: Payer: 59 | Admitting: Physician Assistant

## 2015-03-26 ENCOUNTER — Encounter: Payer: Self-pay | Admitting: Family Medicine

## 2015-03-26 ENCOUNTER — Ambulatory Visit (INDEPENDENT_AMBULATORY_CARE_PROVIDER_SITE_OTHER): Payer: 59 | Admitting: Family Medicine

## 2015-03-26 VITALS — BP 132/88 | HR 83 | Temp 97.8°F | Ht 64.5 in | Wt 161.9 lb

## 2015-03-26 DIAGNOSIS — Z0000 Encounter for general adult medical examination without abnormal findings: Secondary | ICD-10-CM

## 2015-03-26 DIAGNOSIS — K219 Gastro-esophageal reflux disease without esophagitis: Secondary | ICD-10-CM | POA: Insufficient documentation

## 2015-03-26 DIAGNOSIS — I10 Essential (primary) hypertension: Secondary | ICD-10-CM | POA: Diagnosis not present

## 2015-03-26 DIAGNOSIS — E785 Hyperlipidemia, unspecified: Secondary | ICD-10-CM | POA: Diagnosis not present

## 2015-03-26 DIAGNOSIS — R0789 Other chest pain: Secondary | ICD-10-CM

## 2015-03-26 LAB — BASIC METABOLIC PANEL
BUN: 17 mg/dL (ref 6–23)
CHLORIDE: 101 meq/L (ref 96–112)
CO2: 29 mEq/L (ref 19–32)
Calcium: 9.7 mg/dL (ref 8.4–10.5)
Creatinine, Ser: 1.05 mg/dL (ref 0.40–1.20)
GFR: 56.41 mL/min — ABNORMAL LOW (ref >60.00)
GLUCOSE: 94 mg/dL (ref 70–99)
POTASSIUM: 3.2 meq/L — AB (ref 3.5–5.1)
Sodium: 139 mEq/L (ref 135–145)

## 2015-03-26 LAB — LIPID PANEL
CHOLESTEROL: 155 mg/dL (ref 0–200)
HDL: 55.2 mg/dL (ref >39.00)
LDL Cholesterol: 81 mg/dL (ref 0–99)
NonHDL: 99.81
TRIGLYCERIDES: 95 mg/dL (ref 0.0–149.0)
Total CHOL/HDL Ratio: 3
VLDL: 19 mg/dL (ref 0.0–40.0)

## 2015-03-26 LAB — HEMOGLOBIN A1C: Hgb A1c MFr Bld: 5.7 % (ref 4.6–6.5)

## 2015-03-26 NOTE — Patient Instructions (Signed)
BEFORE YOU LEAVE: -labs -schedule follow up in 6 months  Call to schedule your mammogram  -We have ordered labs or studies at this visit. It can take up to 1-2 weeks for results and processing. We will contact you with instructions IF your results are abnormal. Normal results will be released to your [facility]. If you have not heard from [place] or can not find your results in [facility] in 2 weeks please contact our office.  We recommend the following healthy lifestyle measures: - eat a healthy whole foods diet consisting of regular small meals composed of vegetables, fruits, beans, nuts, seeds, healthy meats such as white chicken and fish and whole grains.  - avoid sweets, white starchy foods, fried foods, fast food, processed foods, sodas, red meet and other fattening foods.  - get a least 150-300 minutes of aerobic exercise per week.

## 2015-03-26 NOTE — Progress Notes (Signed)
HPI:  Here for CPE:  -Concerns and/or follow up today:   Dana Hodge is a very pleasant 65 yo F, but unfortunately with poor compliance with treatment, follow up and preventive recommendations in the past.  1-2)Prediabetes/HLD/Obesity: - advised lifestyle changes and statin -denies: polyuria, polydipsia, vision changes, hx reaction to statin  3)Hx HTN: -she has been taking the hctz, but missed dose yesterday, took this morning -denies: CP, SOB, DOE  4) Depressed mood: -advised CBT - she did not do CBT and felt better with exercise -chronic mild depression, anhedonia, poor motivation, stress eating, sad, worsening lately related to marital stress -husband is negative, no SI  5)GERD: -hx mild nausea, heartburrn, acid reflux -symptoms occur after meals and when lies down at night, never with exercise - has had 3 episodes of "squeezing" CP with this -I advised a stress test, but she decided not to do it as reports symptoms completely resolved with dietary changes  -denies: dysphagia, DOE, SOB, CP with activity, vomiting, change in bowels, weight loss, fatigue  -Diet: variety of foods, balance and well rounded  -Exercise: egular exercise  -Taking folic acid, vitamin D or calcium: no  -Diabetes and Dyslipidemia Screening:  -Hx of HTN: no  -Vaccines: UTD  -pap history: 08/2012; endocervical transformation zone absent; neg HPV testing - reports all neg pap in the past and declined repeat today  -FDLMP: n/a  -wants STI testing (Hep C if born [birth year]): no  -FH breast, colon or ovarian ca: see FH Last mammogram:  05/2013 Last colon cancer screening: done 2011, repeat in 10 years per GI letter  -Alcohol, Tobacco, drug use: see social history  Review of Systems - no fevers, unintentional weight loss, vision loss, hearing loss, chest pain, sob, hemoptysis, melena, hematochezia, hematuria, genital discharge, changing or concerning skin lesions, bleeding, bruising, loc, thoughts  of self harm or SI  Past Medical History  Diagnosis Date  . Hyperlipidemia   . Hypertension   . Chicken pox   . UTI (urinary tract infection)   . Colon polyp     No past surgical history on file.  Family History  Problem Relation Age of Onset  . Heart disease Mother 62    CAD  . Hypertension Mother   . Heart disease Father 33    MI  . COPD Father   . Cancer Maternal Uncle     colon, pancreatic  . Cancer Maternal Grandfather     colon, pancreatic    Social History   Social History  . Marital Status: Married    Spouse Name: N/A  . Number of Children: N/A  . Years of Education: N/A   Social History Main Topics  . Smoking status: Never Smoker   . Smokeless tobacco: None  . Alcohol Use: Yes     Comment: a glass of wine with dinner   . Drug Use: No  . Sexual Activity: Yes    Birth Control/ Protection: None   Other Topics Concern  . None   Social History Narrative   Work or School: works as Water quality scientist, office work      Home Situation: lives with husband and handicapped adult son      Spiritual Beliefs: Christian      Lifestyle: going to the gym 3-4 days per week, working on diet - portion sizes are an issue              Current outpatient prescriptions:  .  aspirin 81 MG tablet, Take  81 mg by mouth daily., Disp: , Rfl:  .  hydrochlorothiazide (HYDRODIURIL) 25 MG tablet, Take 1 tablet (25 mg total) by mouth daily., Disp: 90 tablet, Rfl: 3 .  lovastatin (MEVACOR) 20 MG tablet, Take 1 tablet (20 mg total) by mouth at bedtime., Disp: 90 tablet, Rfl: 3 .  MAGNESIUM PO, Take by mouth., Disp: , Rfl:  .  Methylcellulose, Laxative, (CITRUCEL PO), Take by mouth., Disp: , Rfl:   EXAM:  Filed Vitals:   03/26/15 0853  BP: 132/88  Pulse: 83  Temp: 97.8 F (36.6 C)    GENERAL: vitals reviewed and listed below, alert, oriented, appears well hydrated and in no acute distress  HEENT: head atraumatic, PERRLA, normal appearance of eyes, ears, nose and mouth.  moist mucus membranes.  NECK: supple, no masses or lymphadenopathy  LUNGS: clear to auscultation bilaterally, no rales, rhonchi or wheeze  CV: HRRR, no peripheral edema or cyanosis, normal pedal pulses  BREAST: offered, pt declined  ABDOMEN: bowel sounds normal, soft, non tender to palpation, no masses, no rebound or guarding  GU:  Offered, pt declined  SKIN: no rash or abnormal lesions - she declined full skin check  MS: normal gait, moves all extremities normally  NEURO: CN II-XII grossly intact, normal muscle strength and sensation to light touch on extremities  PSYCH: normal affect, pleasant and cooperative  ASSESSMENT AND PLAN:  Discussed the following assessment and plan:  Visit for preventive health examination - Plan: Hemoglobin A1c  Hyperlipemia - Plan: Lipid Panel  Essential hypertension - Plan: Basic metabolic panel  Gastroesophageal reflux disease, esophagitis presence not specified Atypical chest pain -resolved, advised still would do the stress test, but she opted to not do unless has symptoms again - has never had symptoms with activity and was likely related to the GERD  -Discussed and advised all [place] preventive services health task force level A and B recommendations for age, sex and risks.  -Advised at least 150 minutes of exercise per week and a healthy diet low in saturated fats and sweets and consisting of fresh fruits and vegetables, lean meats such as fish and white chicken and whole grains.  -labs, studies and vaccines per orders this encounter  Orders Placed This Encounter  Procedures  . Lipid Panel  . Basic metabolic panel  . Hemoglobin A1c    Patient advised to return to clinic immediately if symptoms worsen or persist or new concerns.  Patient Instructions  BEFORE YOU LEAVE: -labs -schedule follow up in 6 months  Call to schedule your mammogram  -We have ordered labs or studies at this visit. It can take up to 1-2 weeks for results  and processing. We will contact you with instructions IF your results are abnormal. Normal results will be released to your [facility]. If you have not heard from [place] or can not find your results in [facility] in 2 weeks please contact our office.  We recommend the following healthy lifestyle measures: - eat a healthy whole foods diet consisting of regular small meals composed of vegetables, fruits, beans, nuts, seeds, healthy meats such as white chicken and fish and whole grains.  - avoid sweets, white starchy foods, fried foods, fast food, processed foods, sodas, red meet and other fattening foods.  - get a least 150-300 minutes of aerobic exercise per week.           No Follow-up on file.  Colin Benton R.

## 2015-03-26 NOTE — Progress Notes (Signed)
Pre visit review using our clinic review tool, if applicable. No additional management support is needed unless otherwise documented below in the visit note. 

## 2015-04-12 ENCOUNTER — Ambulatory Visit: Payer: 59 | Admitting: Family Medicine

## 2015-06-09 ENCOUNTER — Telehealth: Payer: Self-pay | Admitting: Family Medicine

## 2015-06-09 NOTE — Telephone Encounter (Signed)
Pt concern about the statins and Blood pressure medicine that she is taking and would like to have a call.

## 2015-06-09 NOTE — Telephone Encounter (Signed)
I called the pt and she stated she has had insomnia and severe indigestion for months and feels this is due to the statin especially after looking this up on the internet. States she does not feel her blood pressure medication is working as she had this checked yesterday when she gave blood and it was 150/90.  She questioned if her meds should be changed?

## 2015-06-09 NOTE — Telephone Encounter (Signed)
I left a message at the pts work number to return my call.

## 2015-06-10 NOTE — Telephone Encounter (Signed)
I called the pt and informed her of the message below and scheduled an appt for tomorrow at 9:15am.

## 2015-06-10 NOTE — Telephone Encounter (Signed)
Please have her schedule an appointment to check her blood pressure and discuss her concerns. Advised that she bring the cuff she is using at home and home blood pressure log.

## 2015-06-11 ENCOUNTER — Encounter: Payer: Self-pay | Admitting: Family Medicine

## 2015-06-11 ENCOUNTER — Ambulatory Visit (INDEPENDENT_AMBULATORY_CARE_PROVIDER_SITE_OTHER): Payer: 59 | Admitting: Family Medicine

## 2015-06-11 VITALS — BP 142/90 | HR 78 | Temp 98.3°F | Ht 64.5 in | Wt 162.4 lb

## 2015-06-11 DIAGNOSIS — K219 Gastro-esophageal reflux disease without esophagitis: Secondary | ICD-10-CM | POA: Diagnosis not present

## 2015-06-11 DIAGNOSIS — I10 Essential (primary) hypertension: Secondary | ICD-10-CM

## 2015-06-11 DIAGNOSIS — R0789 Other chest pain: Secondary | ICD-10-CM

## 2015-06-11 DIAGNOSIS — G4700 Insomnia, unspecified: Secondary | ICD-10-CM

## 2015-06-11 DIAGNOSIS — E785 Hyperlipidemia, unspecified: Secondary | ICD-10-CM | POA: Diagnosis not present

## 2015-06-11 MED ORDER — LISINOPRIL 10 MG PO TABS: 10.0000 mg | ORAL_TABLET | Freq: Every day | ORAL | Status: DC

## 2015-06-11 NOTE — Progress Notes (Signed)
Pre visit review using our clinic review tool, if applicable. No additional management support is needed unless otherwise documented below in the visit note. 

## 2015-06-11 NOTE — Progress Notes (Signed)
HPI:  Acute visit for:  HTN: -Reports blood pressure was a little elevated when she gave blood recently -meds: hctz 25 -denies: Exertional chest pain, dyspnea on exertion, shortness of breath, headaches or vision changes  HLD: -meds include lovastatin -she read about statins online and thinks it may be causing indigestion and insomnia; she did a lot of reading on the Internet and she is sure that this is the cause of these issues -denies: Muscle cramps, cognitive changes  Heartburn: -Thinks started about 6 months ago -Occurs primarily at night and after eating or drinking -Substernal chest pain that lasted about 10 minutes -Does not occur with activity -No dyspnea on exertion, exertional chest pain, jaw or arm pain nausea, vomiting, weight loss  or dysphagia  -Advised exercise stress test in the past, however she reports symptoms resolved with dietary changes for acid reflux and she canceled that test.  -She drinks many cups of coffee and tea per day  -She feels the statin has caused this and thinks that it started when she changed to a generic statin   Insomnia -Thinks this started about 6 months ago  -Tosses and turns, wakes up several times during the night and has difficulty falling back asleep  -She is convinced that this is due to her generic statin after she did extensive research online   Due for mammogram  ROS: See pertinent positives and negatives per HPI.  Past Medical History  Diagnosis Date  . Hyperlipidemia   . Hypertension   . Chicken pox   . UTI (urinary tract infection)   . Colon polyp     No past surgical history on file.  Family History  Problem Relation Age of Onset  . Heart disease Mother 30    CAD  . Hypertension Mother   . Heart disease Father 9    MI  . COPD Father   . Cancer Maternal Uncle     colon, pancreatic  . Cancer Maternal Grandfather     colon, pancreatic    Social History   Social History  . Marital Status: Married   Spouse Name: N/A  . Number of Children: N/A  . Years of Education: N/A   Social History Main Topics  . Smoking status: Never Smoker   . Smokeless tobacco: None  . Alcohol Use: Yes     Comment: a glass of wine with dinner   . Drug Use: No  . Sexual Activity: Yes    Birth Control/ Protection: None   Other Topics Concern  . None   Social History Narrative   Work or School: works as Water quality scientist, office work      Home Situation: lives with husband and handicapped adult son      Spiritual Beliefs: Christian      Lifestyle: going to the gym 3-4 days per week, working on diet - portion sizes are an issue              Current outpatient prescriptions:  .  aspirin 81 MG tablet, Take 81 mg by mouth daily., Disp: , Rfl:  .  hydrochlorothiazide (HYDRODIURIL) 25 MG tablet, Take 1 tablet (25 mg total) by mouth daily., Disp: 90 tablet, Rfl: 3 .  lisinopril (PRINIVIL,ZESTRIL) 10 MG tablet, Take 1 tablet (10 mg total) by mouth daily., Disp: 30 tablet, Rfl: 3 .  lovastatin (MEVACOR) 20 MG tablet, Take 1 tablet (20 mg total) by mouth at bedtime., Disp: 90 tablet, Rfl: 3 .  MAGNESIUM PO, Take by  mouth., Disp: , Rfl:  .  Methylcellulose, Laxative, (CITRUCEL PO), Take by mouth., Disp: , Rfl:   EXAM:  Filed Vitals:   06/11/15 0851 06/11/15 0857  BP: 140/84 142/90  Pulse: 89 78  Temp: 98.3 F (36.8 C)     Body mass index is 27.46 kg/(m^2).  GENERAL: vitals reviewed and listed above, alert, oriented, appears well hydrated and in no acute distress  HEENT: atraumatic, conjunttiva clear, no obvious abnormalities on inspection of external nose and ears  NECK: no obvious masses on inspection  LUNGS: clear to auscultation bilaterally, no wheezes, rales or rhonchi, good air movement  CV: HRRR, no peripheral edema  MS: moves all extremities without noticeable abnormality  PSYCH: pleasant and cooperative, no obvious depression or anxiety  ASSESSMENT AND PLAN:  Discussed the  following assessment and plan:  Atypical chest pain - Plan: Exercise Tolerance Test Gastroesophageal reflux disease, esophagitis presence not specified -This is most likely acid related, however I have advised given her risk factors to go ahead with the exercise stress test  -She thinks this is due to a generic statin, she has opted to do a trial off of this prior to any other changes  -She does drink lots of tea and coffee which is likely contributing and I advised her of numerous lifestyle changes and medical treatment options that could help with her acid symptoms   Essential hypertension -She is concerned about her blood pressure, it was up in the 150s over 90s and she gave blood per her report -On my recheck it was mildly elevated in the 140s over 90s -Discussed options/risks, she wants to try adding a medication rather than lifestyle changes at this point and has opted to add lisinopril to her regimen -Follow up in 1 month  Hyperlipemia -She is very anxious about statin medications and has done a lot online reading  -She is very convinced that the statin is causing heartburn and poor sleep  -She has decided to try a trial off of this medication to see if these symptoms resolve  -She may then consider trying a different statin or alternate day dosing or may make the choice to not take this medication despite likely benefits and CV risk reduction that it provides her -Follow up in 1 month  Insomnia -Discussed treatment options for insomnia, she wishes to try stopping the statin before she tries anything else   -Patient advised to return or notify a doctor immediately if symptoms worsen or persist or new concerns arise.  Patient Instructions  Before you leave: -Schedule follow up in 1 month  You have decided to try a 1 month trial off of the statin medication to see if this helps her symptoms.  Please start the lisinopril 10 mg once daily for the blood pressure.  Please get the  exercise stress test.  After you have done the trial off of the cholesterol medication, I would recommend lifestyle changes for the acid reflux and sleep issues. We can discuss these further at your follow-up visit he would like. As we discussed, limiting caffeine, coffee and tea can help the acid reflux. I have included a handout on sleep as well.  Please schedule your mammogram if this has not already been done this year.   FOR IMPROVED SLEEP AND TO RESET YOUR SLEEP SCHEDULE: []  exercise 30 minutes daily  []  go to bed and wake up at the same time  []  keep bedroom cool, dark and quiet  []  reserve bed for  sleep - do not read, watch TV, etc in bed  []  If you toss and turn more then 15-20 minutes get out of bed and list thoughts/do quite activity then go back to bed; repeat as needed; do not worry about when you eventually fall asleep - still get up at the same time and turn on lights and take shower  [] get counseling  []  some people find that a half dose of benadryl, melatonin, tylenol pm or unisom on a few nights per week is helpful initially for a few weeks  [] seek help and treat any depression or anxiety  [] prescription strength sleep medications should only be used in severe cases of insomnia if other measures fail and should be used sparingly      Callyn Severtson, Jarrett Soho R.

## 2015-06-11 NOTE — Patient Instructions (Addendum)
Before you leave: -Schedule follow up in 1 month  You have decided to try a 1 month trial off of the statin medication to see if this helps her symptoms.  Please start the lisinopril 10 mg once daily for the blood pressure.  Please get the exercise stress test.  After you have done the trial off of the cholesterol medication, I would recommend lifestyle changes for the acid reflux and sleep issues. We can discuss these further at your follow-up visit he would like. As we discussed, limiting caffeine, coffee and tea can help the acid reflux. I have included a handout on sleep as well.  Please schedule your mammogram if this has not already been done this year.   FOR IMPROVED SLEEP AND TO RESET YOUR SLEEP SCHEDULE: []  exercise 30 minutes daily  []  go to bed and wake up at the same time  []  keep bedroom cool, dark and quiet  []  reserve bed for sleep - do not read, watch TV, etc in bed  []  If you toss and turn more then 15-20 minutes get out of bed and list thoughts/do quite activity then go back to bed; repeat as needed; do not worry about when you eventually fall asleep - still get up at the same time and turn on lights and take shower  [] get counseling  []  some people find that a half dose of benadryl, melatonin, tylenol pm or unisom on a few nights per week is helpful initially for a few weeks  [] seek help and treat any depression or anxiety  [] prescription strength sleep medications should only be used in severe cases of insomnia if other measures fail and should be used sparingly

## 2015-06-29 ENCOUNTER — Ambulatory Visit (INDEPENDENT_AMBULATORY_CARE_PROVIDER_SITE_OTHER): Payer: 59

## 2015-06-29 DIAGNOSIS — R0789 Other chest pain: Secondary | ICD-10-CM | POA: Diagnosis not present

## 2015-06-29 LAB — EXERCISE TOLERANCE TEST
CHL CUP RESTING HR STRESS: 80 {beats}/min
CHL CUP STRESS STAGE 1 GRADE: 0 %
CHL CUP STRESS STAGE 1 HR: 83 {beats}/min
CHL CUP STRESS STAGE 1 SPEED: 0 mph
CHL CUP STRESS STAGE 2 GRADE: 0 %
CHL CUP STRESS STAGE 2 HR: 83 {beats}/min
CHL CUP STRESS STAGE 2 SPEED: 1 mph
CHL CUP STRESS STAGE 3 HR: 83 {beats}/min
CHL CUP STRESS STAGE 3 SPEED: 1 mph
CHL CUP STRESS STAGE 4 HR: 110 {beats}/min
CHL CUP STRESS STAGE 4 SBP: 167 mmHg
CHL CUP STRESS STAGE 5 GRADE: 12 %
CHL CUP STRESS STAGE 6 GRADE: 14 %
CHL CUP STRESS STAGE 6 HR: 134 {beats}/min
CHL CUP STRESS STAGE 6 SBP: 192 mmHg
CHL CUP STRESS STAGE 7 HR: 148 {beats}/min
CHL CUP STRESS STAGE 7 SPEED: 4.2 mph
CHL CUP STRESS STAGE 8 DBP: 77 mmHg
CHL CUP STRESS STAGE 8 SPEED: 0 mph
CHL CUP STRESS STAGE 9 SPEED: 0 mph
CSEPEDS: 1 s
CSEPHR: 93 %
Estimated workload: 11.7 METS
Exercise duration (min): 10 min
MPHR: 158 {beats}/min
Peak HR: 148 {beats}/min
Percent of predicted max HR: 93 %
RPE: 16
Stage 1 DBP: 90 mmHg
Stage 1 SBP: 136 mmHg
Stage 3 Grade: 0.2 %
Stage 4 DBP: 84 mmHg
Stage 4 Grade: 10 %
Stage 4 Speed: 1.7 mph
Stage 5 HR: 118 {beats}/min
Stage 5 Speed: 2.5 mph
Stage 6 DBP: 78 mmHg
Stage 6 Speed: 3.4 mph
Stage 7 Grade: 16 %
Stage 8 Grade: 0 %
Stage 8 HR: 133 {beats}/min
Stage 8 SBP: 157 mmHg
Stage 9 DBP: 81 mmHg
Stage 9 Grade: 0 %
Stage 9 HR: 86 {beats}/min
Stage 9 SBP: 135 mmHg

## 2015-07-12 ENCOUNTER — Encounter: Payer: Self-pay | Admitting: Family Medicine

## 2015-07-12 ENCOUNTER — Ambulatory Visit (INDEPENDENT_AMBULATORY_CARE_PROVIDER_SITE_OTHER): Payer: 59 | Admitting: Family Medicine

## 2015-07-12 VITALS — BP 102/72 | HR 87 | Temp 98.6°F | Ht 64.5 in | Wt 157.6 lb

## 2015-07-12 DIAGNOSIS — E785 Hyperlipidemia, unspecified: Secondary | ICD-10-CM

## 2015-07-12 DIAGNOSIS — I10 Essential (primary) hypertension: Secondary | ICD-10-CM | POA: Diagnosis not present

## 2015-07-12 DIAGNOSIS — K219 Gastro-esophageal reflux disease without esophagitis: Secondary | ICD-10-CM | POA: Diagnosis not present

## 2015-07-12 DIAGNOSIS — G4700 Insomnia, unspecified: Secondary | ICD-10-CM | POA: Diagnosis not present

## 2015-07-12 NOTE — Progress Notes (Signed)
Pre visit review using our clinic review tool, if applicable. No additional management support is needed unless otherwise documented below in the visit note. 

## 2015-07-12 NOTE — Progress Notes (Signed)
HPI:  HTN: -meds: hctz 25 -denies: Exertional chest pain, dyspnea on exertion, shortness of breath, headaches or vision changes  HLD: -she read about generic statins online and thinks it caused GERD and insomnia; she did a lot of reading on the Internet and she is sure that this is the cause of these issues - she did trial off lovastatin and wants to continue off of this -admits there is "room for improvement" in diet and exercise and prefers to treat her cholesterol this way -denies: Muscle cramps, cognitive changes  Heartburn: -chronic, now resolved after cutting out red wine at night and statin (she feels statin caused this) -No dyspnea on exertion, exertional chest pain, jaw or arm pain nausea, vomiting, weight loss or dysphagia   -She drinks many cups of coffee and tea per day  -exercise stress test negative  Insomnia -now resolved after cutting out wine and statin -She is convinced that this was due to statin after she did extensive research online   ROS: See pertinent positives and negatives per HPI.  Past Medical History  Diagnosis Date  . Hyperlipidemia   . Hypertension   . Chicken pox   . UTI (urinary tract infection)   . Colon polyp     No past surgical history on file.  Family History  Problem Relation Age of Onset  . Heart disease Mother 14    CAD  . Hypertension Mother   . Heart disease Father 31    MI  . COPD Father   . Cancer Maternal Uncle     colon, pancreatic  . Cancer Maternal Grandfather     colon, pancreatic    Social History   Social History  . Marital Status: Married    Spouse Name: N/A  . Number of Children: N/A  . Years of Education: N/A   Social History Main Topics  . Smoking status: Never Smoker   . Smokeless tobacco: None  . Alcohol Use: Yes     Comment: a glass of wine with dinner   . Drug Use: No  . Sexual Activity: Yes    Birth Control/ Protection: None   Other Topics Concern  . None   Social History Narrative    Work or School: works as Water quality scientist, office work      Home Situation: lives with husband and handicapped adult son      Spiritual Beliefs: Christian      Lifestyle: going to the gym 3-4 days per week, working on diet - portion sizes are an issue              Current outpatient prescriptions:  .  aspirin 81 MG tablet, Take 81 mg by mouth daily., Disp: , Rfl:  .  hydrochlorothiazide (HYDRODIURIL) 25 MG tablet, Take 1 tablet (25 mg total) by mouth daily., Disp: 90 tablet, Rfl: 3 .  lisinopril (PRINIVIL,ZESTRIL) 10 MG tablet, Take 1 tablet (10 mg total) by mouth daily., Disp: 30 tablet, Rfl: 3 .  MAGNESIUM PO, Take by mouth., Disp: , Rfl:  .  Methylcellulose, Laxative, (CITRUCEL PO), Take by mouth., Disp: , Rfl:   EXAM:  Filed Vitals:   07/12/15 1014  BP: 102/72  Pulse: 87  Temp: 98.6 F (37 C)    Body mass index is 26.64 kg/(m^2).  GENERAL: vitals reviewed and listed above, alert, oriented, appears well hydrated and in no acute distress  HEENT: atraumatic, conjunttiva clear, no obvious abnormalities on inspection of external nose and ears  NECK: no  obvious masses on inspection  LUNGS: clear to auscultation bilaterally, no wheezes, rales or rhonchi, good air movement  CV: HRRR, no peripheral edema  MS: moves all extremities without noticeable abnormality  PSYCH: pleasant and cooperative, no obvious depression or anxiety  ASSESSMENT AND PLAN:  Discussed the following assessment and plan:  Hyperlipemia  Essential hypertension  Insomnia  Gastroesophageal reflux disease, esophagitis presence not specified  -I'm glad that her acid reflux and insomnia have resolved, cutting out wine may have played a large role in this -She is convinced his statin was causing these symptoms, and has opted to continue off of her statin medication -We discussed other treatment options for cholesterol and she has opted to try lifestyle changes alone moving forward -She does  want to recheck and follow-up in a few months -Patient advised to return or notify a doctor immediately if symptoms worsen or persist or new concerns arise.  Patient Instructions  Before you leave: -Schedule follow-up in about 3-4 months; please come fasting and we will plan to do labs that day  You have decided to treat her cholesterol with a healthy diet and regular exercise instead of the cholesterol medication.  We recommend the following healthy lifestyle measures: - eat a healthy whole foods diet consisting of regular small meals composed of vegetables, fruits, beans, nuts, seeds, healthy meats such as white chicken and fish and whole grains.  - avoid sweets, white starchy foods, fried foods, fast food, processed foods, sodas, sweetened beverages, excessive amounts of eggs or dairy, regular use of coconut oil, red meet - get a least 300 minutes of aerobic exercise per week.       Colin Benton R.

## 2015-07-12 NOTE — Patient Instructions (Signed)
Before you leave: -Schedule follow-up in about 3-4 months; please come fasting and we will plan to do labs that day  You have decided to treat her cholesterol with a healthy diet and regular exercise instead of the cholesterol medication.  We recommend the following healthy lifestyle measures: - eat a healthy whole foods diet consisting of regular small meals composed of vegetables, fruits, beans, nuts, seeds, healthy meats such as white chicken and fish and whole grains.  - avoid sweets, white starchy foods, fried foods, fast food, processed foods, sodas, sweetened beverages, excessive amounts of eggs or dairy, regular use of coconut oil, red meet - get a least 300 minutes of aerobic exercise per week.

## 2015-09-24 ENCOUNTER — Ambulatory Visit: Payer: 59 | Admitting: Family Medicine

## 2015-10-12 ENCOUNTER — Other Ambulatory Visit: Payer: Self-pay | Admitting: Family Medicine

## 2015-11-08 ENCOUNTER — Ambulatory Visit: Payer: 59 | Admitting: Family Medicine

## 2015-11-22 ENCOUNTER — Other Ambulatory Visit: Payer: Self-pay | Admitting: Family Medicine

## 2015-11-22 DIAGNOSIS — Z1231 Encounter for screening mammogram for malignant neoplasm of breast: Secondary | ICD-10-CM

## 2015-12-02 ENCOUNTER — Ambulatory Visit: Payer: 59

## 2015-12-08 ENCOUNTER — Ambulatory Visit
Admission: RE | Admit: 2015-12-08 | Discharge: 2015-12-08 | Disposition: A | Discharge status: Home / Self Care | Payer: 59 | Source: Ambulatory Visit | Attending: Family Medicine | Admitting: Family Medicine

## 2015-12-08 DIAGNOSIS — Z1231 Encounter for screening mammogram for malignant neoplasm of breast: Secondary | ICD-10-CM

## 2015-12-10 ENCOUNTER — Other Ambulatory Visit: Payer: Self-pay | Admitting: Family Medicine

## 2015-12-10 DIAGNOSIS — R928 Other abnormal and inconclusive findings on diagnostic imaging of breast: Secondary | ICD-10-CM

## 2015-12-16 ENCOUNTER — Ambulatory Visit
Admission: RE | Admit: 2015-12-16 | Discharge: 2015-12-16 | Disposition: A | Discharge status: Home / Self Care | Payer: 59 | Source: Ambulatory Visit | Attending: Family Medicine | Admitting: Family Medicine

## 2015-12-16 DIAGNOSIS — R928 Other abnormal and inconclusive findings on diagnostic imaging of breast: Secondary | ICD-10-CM

## 2015-12-22 ENCOUNTER — Other Ambulatory Visit: Payer: Self-pay | Admitting: Family Medicine

## 2015-12-22 NOTE — Progress Notes (Addendum)
HPI:  Follow up:  Hypertension: -take hxtz, lisinpril - tolerating well -denies: cp, sob, doe, ha  Hyperlipidemia/BMI 27: -quit her statin and does refused to take after her own google research - thinks statin caused GERD and insomnia -quit exercising, diet "not good" -aware of CV risks, knows should do better -no cp, sob, doe   ROS: See pertinent positives and negatives per HPI.  Past Medical History:  Diagnosis Date  . Chicken pox   . Colon polyp   . Hyperlipidemia   . Hypertension   . UTI (urinary tract infection)     No past surgical history on file.  Family History  Problem Relation Age of Onset  . Heart disease Mother 100    CAD  . Hypertension Mother   . Heart disease Father 5    MI  . COPD Father   . Cancer Maternal Uncle     colon, pancreatic  . Cancer Maternal Grandfather     colon, pancreatic    Social History   Social History  . Marital status: Married    Spouse name: N/A  . Number of children: N/A  . Years of education: N/A   Social History Main Topics  . Smoking status: Never Smoker  . Smokeless tobacco: None  . Alcohol use Yes     Comment: a glass of wine with dinner   . Drug use: No  . Sexual activity: Yes    Birth control/ protection: None   Other Topics Concern  . None   Social History Narrative   Work or School: works as Water quality scientist, office work      Home Situation: lives with husband and handicapped adult son      Spiritual Beliefs: Christian      Lifestyle: going to the gym 3-4 days per week, working on diet - portion sizes are an issue              Current Outpatient Prescriptions:  .  aspirin 81 MG tablet, Take 81 mg by mouth daily., Disp: , Rfl:  .  hydrochlorothiazide (HYDRODIURIL) 25 MG tablet, TAKE ONE TABLET BY MOUTH ONCE DAILY, Disp: 90 tablet, Rfl: 1 .  lisinopril (PRINIVIL,ZESTRIL) 10 MG tablet, TAKE ONE TABLET BY MOUTH ONCE DAILY, Disp: 30 tablet, Rfl: 3 .  MAGNESIUM PO, Take by mouth., Disp: , Rfl:   .  Methylcellulose, Laxative, (CITRUCEL PO), Take by mouth., Disp: , Rfl:   EXAM:  Vitals:   12/23/15 0811  BP: 130/88  Pulse: 81  Temp: 98.1 F (36.7 C)    Body mass index is 27.48 kg/m.  GENERAL: vitals reviewed and listed above, alert, oriented, appears well hydrated and in no acute distress  HEENT: atraumatic, conjunttiva clear, no obvious abnormalities on inspection of external nose and ears  NECK: no obvious masses on inspection  LUNGS: clear to auscultation bilaterally, no wheezes, rales or rhonchi, good air movement  CV: HRRR, no peripheral edema  MS: moves all extremities without noticeable abnormality  PSYCH: pleasant and cooperative, no obvious depression or anxiety  ASSESSMENT AND PLAN:  Discussed the following assessment and plan:  Hyperlipidemia, unspecified hyperlipidemia type - Plan: Lipid Panel  Essential hypertension - Plan: Basic metabolic panel, CBC (no diff)  BMI 27.0-27.9,adult  Encounter for immunization - Plan: Flu Vaccine QUAD 36+ mos IM  -discussed risks elevated cholesterol, htn, poor diet and sedentary lifestyle - discussed treatment options  -discussed lifestyle recs at length, stressed importance, particularly given her desire to avoid statin -  sees summary below -labs -pap? - agrees to schedule phsyical -flu shot -Patient advised to return or notify a doctor immediately if symptoms worsen or persist or new concerns arise.  Patient Instructions  BEFORE YOU LEAVE: -flu shot -labs -follow up: physical with pap in 3-4 months  We have ordered labs or studies at this visit. It can take up to 1-2 weeks for results and processing. IF results require follow up or explanation, we will call you with instructions. Clinically stable results will be released to your [facility]. If you have not heard from [place] or cannot find your results in [facility] in 2 weeks please contact our office at [phone].  If you are not yet signed up for [facility],  please consider signing up.     We recommend the following healthy lifestyle for LIFE: 1) Small portions.   Tip: eat off of a salad plate instead of a dinner plate.  Tip: It is ok to feel hungry after a meal - that likely means you ate an appropriate portion.  Tip: if you need more or a snack choose fruits, veggies and/or a handful of nuts or seeds.  2) Eat a healthy clean diet.  * Tip: Avoid (less then 1 serving per week): processed foods, sweets, sweetened drinks, white starches (rice, flour, bread, potatoes, pasta, etc), red meat, fast foods, butter  *Tip: CHOOSE instead   * 5-9 servings per day of fresh or frozen fruits and vegetables (but not corn, potatoes, bananas, canned or dried fruit)   *nuts and seeds, beans   *olives and olive oil   *small portions of lean meats such as fish and white chicken    *small portions of whole grains  3)Get at least 150 minutes of sweaty aerobic exercise per week.  4)Reduce stress - consider counseling, meditation and relaxation to balance other aspects of your life.    Colin Benton R., DO

## 2015-12-23 ENCOUNTER — Encounter: Payer: Self-pay | Admitting: Family Medicine

## 2015-12-23 ENCOUNTER — Ambulatory Visit (INDEPENDENT_AMBULATORY_CARE_PROVIDER_SITE_OTHER): Payer: 59 | Admitting: Family Medicine

## 2015-12-23 VITALS — BP 130/88 | HR 81 | Temp 98.1°F | Ht 64.5 in | Wt 162.6 lb

## 2015-12-23 DIAGNOSIS — Z6827 Body mass index (BMI) 27.0-27.9, adult: Secondary | ICD-10-CM | POA: Diagnosis not present

## 2015-12-23 DIAGNOSIS — D649 Anemia, unspecified: Secondary | ICD-10-CM | POA: Diagnosis not present

## 2015-12-23 DIAGNOSIS — Z23 Encounter for immunization: Secondary | ICD-10-CM | POA: Diagnosis not present

## 2015-12-23 DIAGNOSIS — E785 Hyperlipidemia, unspecified: Secondary | ICD-10-CM

## 2015-12-23 DIAGNOSIS — I10 Essential (primary) hypertension: Secondary | ICD-10-CM | POA: Diagnosis not present

## 2015-12-23 LAB — BASIC METABOLIC PANEL
BUN: 15 mg/dL (ref 6–23)
CHLORIDE: 108 meq/L (ref 96–112)
CO2: 27 meq/L (ref 19–32)
Calcium: 9.1 mg/dL (ref 8.4–10.5)
Creatinine, Ser: 0.86 mg/dL (ref 0.40–1.20)
GFR: 70.86 mL/min (ref >60.00)
GLUCOSE: 90 mg/dL (ref 70–99)
POTASSIUM: 3.9 meq/L (ref 3.5–5.1)
Sodium: 140 mEq/L (ref 135–145)

## 2015-12-23 LAB — CBC
HCT: 34.6 % — ABNORMAL LOW (ref 36.0–46.0)
HEMOGLOBIN: 11.8 g/dL — AB (ref 12.0–15.0)
MCHC: 34.1 g/dL (ref 30.0–36.0)
MCV: 87.8 fl (ref 78.0–100.0)
PLATELETS: 226 10*3/uL (ref 150.0–400.0)
RBC: 3.94 Mil/uL (ref 3.87–5.11)
RDW: 12.9 % (ref 11.5–15.5)
WBC: 6.1 10*3/uL (ref 4.0–10.5)

## 2015-12-23 LAB — LIPID PANEL
Cholesterol: 229 mg/dL — ABNORMAL HIGH (ref 0–200)
HDL: 48 mg/dL (ref >39.00)
NONHDL: 181.01
Total CHOL/HDL Ratio: 5
Triglycerides: 205 mg/dL — ABNORMAL HIGH (ref 0.0–149.0)
VLDL: 41 mg/dL — ABNORMAL HIGH (ref 0.0–40.0)

## 2015-12-23 LAB — LDL CHOLESTEROL, DIRECT: LDL DIRECT: 152 mg/dL

## 2015-12-23 NOTE — Progress Notes (Signed)
Pre visit review using our clinic review tool, if applicable. No additional management support is needed unless otherwise documented below in the visit note. 

## 2015-12-23 NOTE — Patient Instructions (Addendum)
BEFORE YOU LEAVE: -flu shot -labs -follow up: physical with pap in 3-4 months  We have ordered labs or studies at this visit. It can take up to 1-2 weeks for results and processing. IF results require follow up or explanation, we will call you with instructions. Clinically stable results will be released to your [facility]. If you have not heard from [place] or cannot find your results in [facility] in 2 weeks please contact our office at [phone].  If you are not yet signed up for [facility], please consider signing up.     We recommend the following healthy lifestyle for LIFE: 1) Small portions.   Tip: eat off of a salad plate instead of a dinner plate.  Tip: It is ok to feel hungry after a meal - that likely means you ate an appropriate portion.  Tip: if you need more or a snack choose fruits, veggies and/or a handful of nuts or seeds.  2) Eat a healthy clean diet.  * Tip: Avoid (less then 1 serving per week): processed foods, sweets, sweetened drinks, white starches (rice, flour, bread, potatoes, pasta, etc), red meat, fast foods, butter  *Tip: CHOOSE instead   * 5-9 servings per day of fresh or frozen fruits and vegetables (but not corn, potatoes, bananas, canned or dried fruit)   *nuts and seeds, beans   *olives and olive oil   *small portions of lean meats such as fish and white chicken    *small portions of whole grains  3)Get at least 150 minutes of sweaty aerobic exercise per week.  4)Reduce stress - consider counseling, meditation and relaxation to balance other aspects of your life.

## 2015-12-24 NOTE — Addendum Note (Signed)
Addended by: Agnes Lawrence on: 12/24/2015 08:39 AM   Modules accepted: Orders

## 2016-01-05 ENCOUNTER — Inpatient Hospital Stay (HOSPITAL_COMMUNITY)
Admission: EM | Admit: 2016-01-05 | Discharge: 2016-01-21 | DRG: 336 | Disposition: A | Discharge status: Home / Self Care | Payer: 59 | Attending: General Surgery | Admitting: General Surgery

## 2016-01-05 ENCOUNTER — Emergency Department (HOSPITAL_COMMUNITY): Payer: 59

## 2016-01-05 DIAGNOSIS — E876 Hypokalemia: Secondary | ICD-10-CM

## 2016-01-05 DIAGNOSIS — E782 Mixed hyperlipidemia: Secondary | ICD-10-CM | POA: Diagnosis present

## 2016-01-05 DIAGNOSIS — K3532 Acute appendicitis with perforation and localized peritonitis, without abscess: Secondary | ICD-10-CM

## 2016-01-05 DIAGNOSIS — I10 Essential (primary) hypertension: Secondary | ICD-10-CM | POA: Diagnosis present

## 2016-01-05 DIAGNOSIS — Z8249 Family history of ischemic heart disease and other diseases of the circulatory system: Secondary | ICD-10-CM

## 2016-01-05 DIAGNOSIS — D62 Acute posthemorrhagic anemia: Secondary | ICD-10-CM | POA: Diagnosis not present

## 2016-01-05 DIAGNOSIS — R21 Rash and other nonspecific skin eruption: Secondary | ICD-10-CM | POA: Diagnosis not present

## 2016-01-05 DIAGNOSIS — K567 Ileus, unspecified: Secondary | ICD-10-CM | POA: Diagnosis not present

## 2016-01-05 DIAGNOSIS — IMO0002 Reserved for concepts with insufficient information to code with codable children: Secondary | ICD-10-CM

## 2016-01-05 DIAGNOSIS — E785 Hyperlipidemia, unspecified: Secondary | ICD-10-CM | POA: Diagnosis present

## 2016-01-05 DIAGNOSIS — E871 Hypo-osmolality and hyponatremia: Secondary | ICD-10-CM | POA: Diagnosis present

## 2016-01-05 DIAGNOSIS — K352 Acute appendicitis with generalized peritonitis: Secondary | ICD-10-CM | POA: Diagnosis not present

## 2016-01-05 DIAGNOSIS — Z7982 Long term (current) use of aspirin: Secondary | ICD-10-CM

## 2016-01-05 DIAGNOSIS — Z86010 Personal history of colonic polyps: Secondary | ICD-10-CM

## 2016-01-05 DIAGNOSIS — K219 Gastro-esophageal reflux disease without esophagitis: Secondary | ICD-10-CM | POA: Diagnosis present

## 2016-01-05 DIAGNOSIS — K3533 Acute appendicitis with perforation and localized peritonitis, with abscess: Secondary | ICD-10-CM | POA: Diagnosis present

## 2016-01-05 DIAGNOSIS — Z6827 Body mass index (BMI) 27.0-27.9, adult: Secondary | ICD-10-CM

## 2016-01-05 DIAGNOSIS — K381 Appendicular concretions: Secondary | ICD-10-CM | POA: Diagnosis present

## 2016-01-05 DIAGNOSIS — E861 Hypovolemia: Secondary | ICD-10-CM | POA: Diagnosis present

## 2016-01-05 DIAGNOSIS — Z79899 Other long term (current) drug therapy: Secondary | ICD-10-CM

## 2016-01-05 LAB — URINALYSIS, ROUTINE W REFLEX MICROSCOPIC
Bilirubin Urine: NEGATIVE
GLUCOSE, UA: NEGATIVE mg/dL
HGB URINE DIPSTICK: NEGATIVE
KETONES UR: NEGATIVE mg/dL
Nitrite: NEGATIVE
PROTEIN: 30 mg/dL — AB
Specific Gravity, Urine: 1.026 (ref 1.005–1.030)
pH: 6 (ref 5.0–8.0)

## 2016-01-05 LAB — CBC
HCT: 41.2 % (ref 36.0–46.0)
Hemoglobin: 14.3 g/dL (ref 12.0–15.0)
MCH: 29.7 pg (ref 26.0–34.0)
MCHC: 34.7 g/dL (ref 30.0–36.0)
MCV: 85.7 fL (ref 78.0–100.0)
PLATELETS: 273 10*3/uL (ref 150–400)
RBC: 4.81 MIL/uL (ref 3.87–5.11)
RDW: 13.1 % (ref 11.5–15.5)
WBC: 16.3 10*3/uL — AB (ref 4.0–10.5)

## 2016-01-05 LAB — COMPREHENSIVE METABOLIC PANEL
ALK PHOS: 47 U/L (ref 38–126)
ALT: 24 U/L (ref 14–54)
AST: 29 U/L (ref 15–41)
Albumin: 4.2 g/dL (ref 3.5–5.0)
Anion gap: 13 (ref 5–15)
BILIRUBIN TOTAL: 1.6 mg/dL — AB (ref 0.3–1.2)
BUN: 20 mg/dL (ref 6–20)
CHLORIDE: 92 mmol/L — AB (ref 101–111)
CO2: 24 mmol/L (ref 22–32)
CREATININE: 1.11 mg/dL — AB (ref 0.44–1.00)
Calcium: 9.7 mg/dL (ref 8.9–10.3)
GFR calc Af Amer: >60 mL/min (ref >60)
GFR, EST NON AFRICAN AMERICAN: 52 mL/min — AB (ref >60)
Glucose, Bld: 128 mg/dL — ABNORMAL HIGH (ref 65–99)
Potassium: 3.6 mmol/L (ref 3.5–5.1)
Sodium: 129 mmol/L — ABNORMAL LOW (ref 135–145)
Total Protein: 7.3 g/dL (ref 6.5–8.1)

## 2016-01-05 LAB — URINE MICROSCOPIC-ADD ON
Bacteria, UA: NONE SEEN
RBC / HPF: NONE SEEN RBC/hpf (ref 0–5)
Squamous Epithelial / LPF: NONE SEEN

## 2016-01-05 LAB — LIPASE, BLOOD: LIPASE: 25 U/L (ref 11–51)

## 2016-01-05 MED ORDER — MORPHINE SULFATE (PF) 4 MG/ML IV SOLN
4.0000 mg | Freq: Once | INTRAVENOUS | Status: AC
  Administered 2016-01-05: 4 mg via INTRAVENOUS
  Filled 2016-01-05: qty 1

## 2016-01-05 MED ORDER — SODIUM CHLORIDE 0.9 % IV BOLUS (SEPSIS)
1000.0000 mL | Freq: Once | INTRAVENOUS | Status: AC
  Administered 2016-01-05: 1000 mL via INTRAVENOUS

## 2016-01-05 MED ORDER — ONDANSETRON HCL 4 MG/2ML IJ SOLN
4.0000 mg | Freq: Four times a day (QID) | INTRAMUSCULAR | Status: DC | PRN
  Administered 2016-01-07 – 2016-01-13 (×10): 4 mg via INTRAVENOUS
  Filled 2016-01-05 (×11): qty 2

## 2016-01-05 MED ORDER — DIPHENHYDRAMINE HCL 12.5 MG/5ML PO ELIX
12.5000 mg | ORAL_SOLUTION | Freq: Four times a day (QID) | ORAL | Status: DC | PRN
  Administered 2016-01-12 – 2016-01-14 (×2): 12.5 mg via ORAL
  Filled 2016-01-05 (×2): qty 5

## 2016-01-05 MED ORDER — PIPERACILLIN-TAZOBACTAM 3.375 G IVPB 30 MIN
3.3750 g | Freq: Once | INTRAVENOUS | Status: AC
  Administered 2016-01-05: 3.375 g via INTRAVENOUS
  Filled 2016-01-05: qty 50

## 2016-01-05 MED ORDER — SODIUM CHLORIDE 0.9 % IV SOLN
INTRAVENOUS | Status: DC
  Administered 2016-01-06 (×2): via INTRAVENOUS
  Administered 2016-01-06: 1000 mL via INTRAVENOUS
  Administered 2016-01-07: 08:00:00 via INTRAVENOUS

## 2016-01-05 MED ORDER — ONDANSETRON HCL 4 MG/2ML IJ SOLN
4.0000 mg | Freq: Once | INTRAMUSCULAR | Status: AC
  Administered 2016-01-05: 4 mg via INTRAVENOUS
  Filled 2016-01-05: qty 2

## 2016-01-05 MED ORDER — SODIUM CHLORIDE 0.9 % IJ SOLN
INTRAMUSCULAR | Status: AC
  Filled 2016-01-05: qty 50

## 2016-01-05 MED ORDER — HYDROCHLOROTHIAZIDE 25 MG PO TABS: 25.0000 mg | ORAL_TABLET | Freq: Every day | ORAL | Status: DC

## 2016-01-05 MED ORDER — MORPHINE SULFATE (PF) 2 MG/ML IV SOLN
2.0000 mg | INTRAVENOUS | Status: DC | PRN
  Administered 2016-01-06: 2 mg via INTRAVENOUS
  Administered 2016-01-06: 4 mg via INTRAVENOUS
  Administered 2016-01-06 – 2016-01-07 (×4): 2 mg via INTRAVENOUS
  Filled 2016-01-05: qty 2
  Filled 2016-01-05: qty 1
  Filled 2016-01-05: qty 2
  Filled 2016-01-05 (×5): qty 1

## 2016-01-05 MED ORDER — ONDANSETRON 4 MG PO TBDP
4.0000 mg | ORAL_TABLET | Freq: Four times a day (QID) | ORAL | Status: DC | PRN
  Administered 2016-01-08: 4 mg via ORAL
  Filled 2016-01-05: qty 1

## 2016-01-05 MED ORDER — IOPAMIDOL (ISOVUE-300) INJECTION 61%
100.0000 mL | Freq: Once | INTRAVENOUS | Status: AC | PRN
  Administered 2016-01-05: 100 mL via INTRAVENOUS

## 2016-01-05 MED ORDER — ENOXAPARIN SODIUM 40 MG/0.4ML ~~LOC~~ SOLN
40.0000 mg | Freq: Every day | SUBCUTANEOUS | Status: DC
  Administered 2016-01-06 – 2016-01-14 (×10): 40 mg via SUBCUTANEOUS
  Filled 2016-01-05 (×10): qty 0.4

## 2016-01-05 MED ORDER — LISINOPRIL 10 MG PO TABS
10.0000 mg | ORAL_TABLET | Freq: Every day | ORAL | Status: DC
  Administered 2016-01-06 – 2016-01-15 (×4): 10 mg via ORAL
  Filled 2016-01-05 (×7): qty 1

## 2016-01-05 MED ORDER — METRONIDAZOLE IN NACL 5-0.79 MG/ML-% IV SOLN
500.0000 mg | Freq: Three times a day (TID) | INTRAVENOUS | Status: DC
  Administered 2016-01-06 – 2016-01-08 (×7): 500 mg via INTRAVENOUS
  Filled 2016-01-05 (×7): qty 100

## 2016-01-05 MED ORDER — IOPAMIDOL (ISOVUE-300) INJECTION 61%
INTRAVENOUS | Status: AC
  Filled 2016-01-05: qty 100

## 2016-01-05 MED ORDER — DEXTROSE 5 % IV SOLN
2.0000 g | Freq: Every day | INTRAVENOUS | Status: DC
  Administered 2016-01-06 – 2016-01-07 (×3): 2 g via INTRAVENOUS
  Filled 2016-01-05 (×3): qty 2

## 2016-01-05 MED ORDER — IOPAMIDOL (ISOVUE-300) INJECTION 61%
30.0000 mL | Freq: Once | INTRAVENOUS | Status: AC | PRN
  Administered 2016-01-05: 15 mL via ORAL

## 2016-01-05 MED ORDER — DIPHENHYDRAMINE HCL 50 MG/ML IJ SOLN: 12.5000 mg | Freq: Four times a day (QID) | INTRAMUSCULAR | Status: DC | PRN

## 2016-01-05 NOTE — H&P (Signed)
Dana Hodge is an 65 y.o. female.   Chief Complaint: abd pain HPI: 65 y.o. F with abdominal pain since yesterday.  She reports diffuse abdominal pain, nausea, vomiting and diarrhea yesterday that worsened in intensity today.  She presented to the ED this evening.  Reports subjective fevers as well.  Past Medical History:  Diagnosis Date  . Chicken pox   . Colon polyp   . Hyperlipidemia   . Hypertension   . UTI (urinary tract infection)     No past surgical history on file.  Family History  Problem Relation Age of Onset  . Heart disease Mother 46    CAD  . Hypertension Mother   . Heart disease Father 96    MI  . COPD Father   . Cancer Maternal Uncle     colon, pancreatic  . Cancer Maternal Grandfather     colon, pancreatic   Social History:  reports that she has never smoked. She does not have any smokeless tobacco history on file. She reports that she drinks alcohol. She reports that she does not use drugs.  Allergies: No Known Allergies   (Not in a hospital admission)  Results for orders placed or performed during the hospital encounter of 01/05/16 (from the past 48 hour(s))  Urinalysis, Routine w reflex microscopic     Status: Abnormal   Collection Time: 01/05/16  5:40 PM  Result Value Ref Range   Color, Urine AMBER (A) YELLOW    Comment: BIOCHEMICALS MAY BE AFFECTED BY COLOR   APPearance CLOUDY (A) CLEAR   Specific Gravity, Urine 1.026 1.005 - 1.030   pH 6.0 5.0 - 8.0   Glucose, UA NEGATIVE NEGATIVE mg/dL   Hgb urine dipstick NEGATIVE NEGATIVE   Bilirubin Urine NEGATIVE NEGATIVE   Ketones, ur NEGATIVE NEGATIVE mg/dL   Protein, ur 30 (A) NEGATIVE mg/dL   Nitrite NEGATIVE NEGATIVE   Leukocytes, UA SMALL (A) NEGATIVE  Urine microscopic-add on     Status: None   Collection Time: 01/05/16  5:40 PM  Result Value Ref Range   Squamous Epithelial / LPF NONE SEEN NONE SEEN   WBC, UA 6-30 0 - 5 WBC/hpf   RBC / HPF NONE SEEN 0 - 5 RBC/hpf   Bacteria, UA NONE SEEN NONE  SEEN   Urine-Other MUCOUS PRESENT   Lipase, blood     Status: None   Collection Time: 01/05/16  6:04 PM  Result Value Ref Range   Lipase 25 11 - 51 U/L  Comprehensive metabolic panel     Status: Abnormal   Collection Time: 01/05/16  6:04 PM  Result Value Ref Range   Sodium 129 (L) 135 - 145 mmol/L   Potassium 3.6 3.5 - 5.1 mmol/L   Chloride 92 (L) 101 - 111 mmol/L   CO2 24 22 - 32 mmol/L   Glucose, Bld 128 (H) 65 - 99 mg/dL   BUN 20 6 - 20 mg/dL   Creatinine, Ser 1.11 (H) 0.44 - 1.00 mg/dL   Calcium 9.7 8.9 - 10.3 mg/dL   Total Protein 7.3 6.5 - 8.1 g/dL   Albumin 4.2 3.5 - 5.0 g/dL   AST 29 15 - 41 U/L   ALT 24 14 - 54 U/L   Alkaline Phosphatase 47 38 - 126 U/L   Total Bilirubin 1.6 (H) 0.3 - 1.2 mg/dL   GFR calc non Af Amer 52 (L) >60 mL/min   GFR calc Af Amer >60 >60 mL/min    Comment: (NOTE) The  eGFR has been calculated using the CKD EPI equation. This calculation has not been validated in all clinical situations. eGFR's persistently <60 mL/min signify possible Chronic Kidney Disease.    Anion gap 13 5 - 15  CBC     Status: Abnormal   Collection Time: 01/05/16  6:04 PM  Result Value Ref Range   WBC 16.3 (H) 4.0 - 10.5 K/uL   RBC 4.81 3.87 - 5.11 MIL/uL   Hemoglobin 14.3 12.0 - 15.0 g/dL   HCT 41.2 36.0 - 46.0 %   MCV 85.7 78.0 - 100.0 fL   MCH 29.7 26.0 - 34.0 pg   MCHC 34.7 30.0 - 36.0 g/dL   RDW 13.1 11.5 - 15.5 %   Platelets 273 150 - 400 K/uL   Ct Abdomen Pelvis W Contrast  Result Date: 01/05/2016 CLINICAL DATA:  Abdominal pain, nausea, vomiting and diarrhea since yesterday. Leukocytosis to 16.3. EXAM: CT ABDOMEN AND PELVIS WITH CONTRAST TECHNIQUE: Multidetector CT imaging of the abdomen and pelvis was performed using the standard protocol following bolus administration of intravenous contrast. CONTRAST:  134m ISOVUE-300 IOPAMIDOL (ISOVUE-300) INJECTION 61%, 140mISOVUE-300 IOPAMIDOL (ISOVUE-300) INJECTION 61% COMPARISON:  None. FINDINGS: Lower chest:  Bibasilar dependent atelectasis. No effusion. The visualized cardiac chambers are within normal limits. No pericardial effusion. Hepatobiliary: The liver enhances homogeneously. No intrahepatic biliary dilatation. There is uncomplicated cholelithiasis with a 17 mm gallstone seen within the gallbladder. No wall thickening nor pericholecystic fluid. Pancreas: The pancreas is normal in appearance without space-occupying mass nor ductal dilatation. Spleen: The spleen is not enlarged. Adrenals/Urinary Tract: The adrenal glands are normal. There is a tiny right interpolar 3 mm hypodensity statistically consistent with a cyst but too small to further characterize. No obstructive uropathy. The bladder is physiologically distended. Stomach/Bowel: There is abnormal distention of the appendix to 12 mm in diameter. There is inflammatory thickening of the appendix which contains several appendicoliths, the largest is 45m59mn the mid portion with smaller stones further distal (series 5 images 48-54, series 4 image 69). Extraluminal gas is seen adjacent to the appendix consistent with acute perforated appendicitis. There is a small amount of free fluid in the right hemipelvis without definite enhancement to suggest an abscess. Sympathetic thickening and inflammatory change noted of the distal and terminal ileum. This results in moderate diffuse narrowing of the affected ileal loops causing more proximal jejunal fluid-filled distention and thickening. Findings may reflect small-bowel ileus and sympathetic inflammation. Vascular/Lymphatic: No significant vascular findings are present. No enlarged abdominal or pelvic lymph nodes. Reproductive: Uterus and bilateral adnexa are unremarkable. Other: Small amount of free fluid in the right lower quadrant and pelvis as stated. Musculoskeletal: No acute osseous abnormality. IMPRESSION: 1. Acute perforated appendicitis with periappendiceal inflammation and sympathetic thickening of the  terminal and distal ileum causing more proximal small bowel distention and thickening. Small bowel distention and thickening of jejunal loops may represent a sympathetic response to the inflammation. Ileus is also present. Critical Value/emergent results were called by telephone at the time of interpretation on 01/05/2016 at 8:24 pm to Dr. NATDavonna Bellingwho verbally acknowledged these results. 2. There are appendicoliths noted within the mid to distal appendix, the largest is in the mid appendix measuring 45mm87m. Small amount of free fluid without enhancement noted in the pelvis and right lower quadrant. 4. Uncomplicated cholelithiasis. 5. 3 mm cyst in the right kidney too small to further characterize. Electronically Signed   By: DaviAshley Royalty.   On: 01/05/2016 20:24  Review of Systems  Constitutional: Positive for fever. Negative for chills.  HENT: Negative for congestion and hearing loss.   Eyes: Negative for blurred vision and double vision.  Respiratory: Negative for cough and shortness of breath.   Cardiovascular: Negative for chest pain and palpitations.  Gastrointestinal: Positive for abdominal pain, diarrhea, nausea and vomiting.  Genitourinary: Negative for dysuria, frequency and urgency.  Musculoskeletal: Negative for myalgias.  Skin: Negative for itching and rash.  Neurological: Negative for dizziness.    Blood pressure 122/74, pulse 97, temperature 98.4 F (36.9 C), temperature source Oral, resp. rate 19, weight 72.8 kg (160 lb 8 oz), SpO2 98 %. Physical Exam  Constitutional: She is oriented to person, place, and time. She appears well-developed and well-nourished.  HENT:  Head: Normocephalic and atraumatic.  Eyes: Conjunctivae and EOM are normal. Pupils are equal, round, and reactive to light.  Neck: Normal range of motion. Neck supple. No tracheal deviation present.  Cardiovascular: Normal rate and regular rhythm.   Respiratory: Effort normal and breath sounds normal.  No respiratory distress.  GI: Soft. She exhibits no distension. There is tenderness. There is no guarding.  Musculoskeletal: Normal range of motion.  Neurological: She is alert and oriented to person, place, and time.  Skin: Skin is warm and dry.     Assessment/Plan 65 y.o. F with perforated appendicitis.  Hemodynamically stable.  Will admit to the floor.  Place on IV antibiotics.  Monitor for abscess development.    Rosario Adie., MD 67/02/1001, 10:11 PM

## 2016-01-05 NOTE — ED Provider Notes (Signed)
[facility] DEPT Provider Note   CSN: [id number] Arrival date & time: 01/05/16  1730     History   Chief Complaint Chief Complaint  Patient presents with  . Abdominal Pain    HPI Dana Hodge is a 65 y.o. female.  HPI Patient presents with nausea vomiting some diarrhea. Began yesterday. Has had pain in her lower abdomen. Also states had been in the upper abdomen at times. She has had a decreased appetite. No appetite. She has not had pains like this before. Pain is dull and constant. Worse with movement.   Past Medical History:  Diagnosis Date  . Chicken pox   . Colon polyp   . Hyperlipidemia   . Hypertension   . UTI (urinary tract infection)     Patient Active Problem List   Diagnosis Date Noted  . Perforated appendicitis 01/05/2016  . Esophageal reflux 03/26/2015  . Hyperlipemia 01/12/2009  . Essential hypertension 01/12/2009    No past surgical history on file.  OB History    No data available       Home Medications    Prior to Admission medications   Medication Sig Start Date End Date Taking? Authorizing Provider  aspirin 81 MG tablet Take 81 mg by mouth daily.   Yes Historical Provider, MD  hydrochlorothiazide (HYDRODIURIL) 25 MG tablet TAKE ONE TABLET BY MOUTH ONCE DAILY 12/23/15  Yes [name], DO  lisinopril (PRINIVIL,ZESTRIL) 10 MG tablet TAKE ONE TABLET BY MOUTH ONCE DAILY 10/12/15  Yes [name], DO  MAGNESIUM PO Take 250 mg by mouth 2 (two) times daily.    Yes Historical Provider, MD  senna (SENOKOT) 8.6 MG TABS tablet Take 1-2 tablets by mouth at bedtime as needed for mild constipation.   Yes Historical Provider, MD    Family History Family History  Problem Relation Age of Onset  . Heart disease Mother 73    CAD  . Hypertension Mother   . Heart disease Father 25    MI  . COPD Father   . Cancer Maternal Uncle     colon, pancreatic  . Cancer Maternal Grandfather     colon, pancreatic    Social History Social History    Substance Use Topics  . Smoking status: Never Smoker  . Smokeless tobacco: Not on file  . Alcohol use Yes     Comment: a glass of wine with dinner      Allergies   Patient has no known allergies.   Review of Systems Review of Systems  Constitutional: Positive for appetite change and fatigue.  HENT: Negative for congestion.   Eyes: Negative for photophobia.  Respiratory: Negative for shortness of breath.   Cardiovascular: Negative for chest pain.  Gastrointestinal: Positive for abdominal pain, diarrhea, nausea and vomiting.  Genitourinary: Negative for dyspareunia, dysuria and genital sores.  Musculoskeletal: Negative for back pain.  Skin: Negative for wound.  Neurological: Negative for seizures.  Psychiatric/Behavioral: Negative for confusion.     Physical Exam Updated Vital Signs BP 122/74   Pulse 97   Temp 98.4 F (36.9 C) (Oral)   Resp 19   Wt 160 lb 8 oz (72.8 kg)   SpO2 98%   BMI 27.12 kg/m   Physical Exam  Constitutional: She appears well-developed.  Patient appears uncomfortable  HENT:  Head: Atraumatic.  Neck: Neck supple.  Cardiovascular:  Mild tachycardia  Pulmonary/Chest: Effort normal.  Abdominal: There is tenderness.  Somewhat diffuse tenderness but worse in the lower abdomen and  maximum right lower quadrant. No hernias palpated.  Musculoskeletal: She exhibits edema.  Neurological: She is alert.  Skin: Skin is warm. Capillary refill takes less than 2 seconds.  Psychiatric: She has a normal mood and affect.     ED Treatments / Results  Labs (all labs ordered are listed, but only abnormal results are displayed) Labs Reviewed  COMPREHENSIVE METABOLIC PANEL - Abnormal; Notable for the following:       Result Value   Sodium 129 (*)    Chloride 92 (*)    Glucose, Bld 128 (*)    Creatinine, Ser 1.11 (*)    Total Bilirubin 1.6 (*)    GFR calc non Af Amer 52 (*)    All other components within normal limits  CBC - Abnormal; Notable for the  following:    WBC 16.3 (*)    All other components within normal limits  URINALYSIS, ROUTINE W REFLEX MICROSCOPIC (NOT AT [facility]) - Abnormal; Notable for the following:    Color, Urine AMBER (*)    APPearance CLOUDY (*)    Protein, ur 30 (*)    Leukocytes, UA SMALL (*)    All other components within normal limits  LIPASE, BLOOD  URINE MICROSCOPIC-ADD ON    EKG  EKG Interpretation None       Radiology Ct Abdomen Pelvis W Contrast  Result Date: 01/05/2016 CLINICAL DATA:  Abdominal pain, nausea, vomiting and diarrhea since yesterday. Leukocytosis to 16.3. EXAM: CT ABDOMEN AND PELVIS WITH CONTRAST TECHNIQUE: Multidetector CT imaging of the abdomen and pelvis was performed using the standard protocol following bolus administration of intravenous contrast. CONTRAST:  143mL ISOVUE-300 IOPAMIDOL (ISOVUE-300) INJECTION 61%, 22mL ISOVUE-300 IOPAMIDOL (ISOVUE-300) INJECTION 61% COMPARISON:  None. FINDINGS: Lower chest: Bibasilar dependent atelectasis. No effusion. The visualized cardiac chambers are within normal limits. No pericardial effusion. Hepatobiliary: The liver enhances homogeneously. No intrahepatic biliary dilatation. There is uncomplicated cholelithiasis with a 17 mm gallstone seen within the gallbladder. No wall thickening nor pericholecystic fluid. Pancreas: The pancreas is normal in appearance without space-occupying mass nor ductal dilatation. Spleen: The spleen is not enlarged. Adrenals/Urinary Tract: The adrenal glands are normal. There is a tiny right interpolar 3 mm hypodensity statistically consistent with a cyst but too small to further characterize. No obstructive uropathy. The bladder is physiologically distended. Stomach/Bowel: There is abnormal distention of the appendix to 12 mm in diameter. There is inflammatory thickening of the appendix which contains several appendicoliths, the largest is 50mm in the mid portion with smaller stones further distal (series 5 images 48-54,  series 4 image 69). Extraluminal gas is seen adjacent to the appendix consistent with acute perforated appendicitis. There is a small amount of free fluid in the right hemipelvis without definite enhancement to suggest an abscess. Sympathetic thickening and inflammatory change noted of the distal and terminal ileum. This results in moderate diffuse narrowing of the affected ileal loops causing more proximal jejunal fluid-filled distention and thickening. Findings may reflect small-bowel ileus and sympathetic inflammation. Vascular/Lymphatic: No significant vascular findings are present. No enlarged abdominal or pelvic lymph nodes. Reproductive: Uterus and bilateral adnexa are unremarkable. Other: Small amount of free fluid in the right lower quadrant and pelvis as stated. Musculoskeletal: No acute osseous abnormality. IMPRESSION: 1. Acute perforated appendicitis with periappendiceal inflammation and sympathetic thickening of the terminal and distal ileum causing more proximal small bowel distention and thickening. Small bowel distention and thickening of jejunal loops may represent a sympathetic response to the inflammation. Ileus is also present. Critical  Value/emergent results were called by telephone at the time of interpretation on 01/05/2016 at 8:24 pm to Dr. Davonna Belling , who verbally acknowledged these results. 2. There are appendicoliths noted within the mid to distal appendix, the largest is in the mid appendix measuring 91mm. 3. Small amount of free fluid without enhancement noted in the pelvis and right lower quadrant. 4. Uncomplicated cholelithiasis. 5. 3 mm cyst in the right kidney too small to further characterize. Electronically Signed   By: Ashley Royalty M.D.   On: 01/05/2016 20:24    Procedures Procedures (including critical care time)  Medications Ordered in ED Medications  sodium chloride 0.9 % injection (not administered)  iopamidol (ISOVUE-300) 61 % injection (not administered)    sodium chloride 0.9 % bolus 1,000 mL (0 mLs Intravenous Stopped 01/05/16 2032)  ondansetron (ZOFRAN) injection 4 mg (4 mg Intravenous Given 01/05/16 1906)  morphine 4 MG/ML injection 4 mg (4 mg Intravenous Given 01/05/16 1907)  iopamidol (ISOVUE-300) 61 % injection 100 mL (100 mLs Intravenous Contrast Given 01/05/16 1939)  iopamidol (ISOVUE-300) 61 % injection 30 mL (15 mLs Oral Contrast Given 01/05/16 1840)  piperacillin-tazobactam (ZOSYN) IVPB 3.375 g (0 g Intravenous Stopped 01/05/16 2157)  morphine 4 MG/ML injection 4 mg (4 mg Intravenous Given 01/05/16 2113)     Initial Impression / Assessment and Plan / ED Course  I have reviewed the triage vital signs and the nursing notes.  Pertinent labs & imaging results that were available during my care of the patient were reviewed by me and considered in my medical decision making (see chart for details).  Clinical Course     Patient with abdominal pain. White count elevated. Will do have perforated appendicitis without abscess. Admit to Dr. Marcello Moores.  Final Clinical Impressions(s) / ED Diagnoses   Final diagnoses:  Perforated appendicitis    New Prescriptions New Prescriptions   No medications on file     Davonna Belling, MD 01/05/16 2242

## 2016-01-05 NOTE — ED Triage Notes (Signed)
Pt reports abd pain, n/v and diarrhea that started yesterday. Pt denies fever at home. Pt A+OX4, speaking in complete sentences, ambulatory to triage.

## 2016-01-06 ENCOUNTER — Encounter (HOSPITAL_COMMUNITY): Payer: Self-pay | Admitting: General Practice

## 2016-01-06 DIAGNOSIS — Z8249 Family history of ischemic heart disease and other diseases of the circulatory system: Secondary | ICD-10-CM | POA: Diagnosis not present

## 2016-01-06 DIAGNOSIS — D62 Acute posthemorrhagic anemia: Secondary | ICD-10-CM | POA: Diagnosis not present

## 2016-01-06 DIAGNOSIS — K567 Ileus, unspecified: Secondary | ICD-10-CM | POA: Diagnosis not present

## 2016-01-06 DIAGNOSIS — K219 Gastro-esophageal reflux disease without esophagitis: Secondary | ICD-10-CM | POA: Diagnosis present

## 2016-01-06 DIAGNOSIS — Z79899 Other long term (current) drug therapy: Secondary | ICD-10-CM | POA: Diagnosis not present

## 2016-01-06 DIAGNOSIS — K381 Appendicular concretions: Secondary | ICD-10-CM | POA: Diagnosis present

## 2016-01-06 DIAGNOSIS — E861 Hypovolemia: Secondary | ICD-10-CM | POA: Diagnosis present

## 2016-01-06 DIAGNOSIS — Z86010 Personal history of colonic polyps: Secondary | ICD-10-CM | POA: Diagnosis not present

## 2016-01-06 DIAGNOSIS — K352 Acute appendicitis with generalized peritonitis: Secondary | ICD-10-CM | POA: Diagnosis present

## 2016-01-06 DIAGNOSIS — E785 Hyperlipidemia, unspecified: Secondary | ICD-10-CM | POA: Diagnosis present

## 2016-01-06 DIAGNOSIS — I10 Essential (primary) hypertension: Secondary | ICD-10-CM | POA: Diagnosis present

## 2016-01-06 DIAGNOSIS — E871 Hypo-osmolality and hyponatremia: Secondary | ICD-10-CM | POA: Diagnosis present

## 2016-01-06 DIAGNOSIS — R21 Rash and other nonspecific skin eruption: Secondary | ICD-10-CM | POA: Diagnosis not present

## 2016-01-06 DIAGNOSIS — Z7982 Long term (current) use of aspirin: Secondary | ICD-10-CM | POA: Diagnosis not present

## 2016-01-06 DIAGNOSIS — Z6827 Body mass index (BMI) 27.0-27.9, adult: Secondary | ICD-10-CM | POA: Diagnosis not present

## 2016-01-06 DIAGNOSIS — E876 Hypokalemia: Secondary | ICD-10-CM | POA: Diagnosis not present

## 2016-01-06 LAB — CBC
HCT: 33.7 % — ABNORMAL LOW (ref 36.0–46.0)
HEMOGLOBIN: 11.5 g/dL — AB (ref 12.0–15.0)
MCH: 28.9 pg (ref 26.0–34.0)
MCHC: 34.1 g/dL (ref 30.0–36.0)
MCV: 84.7 fL (ref 78.0–100.0)
PLATELETS: 217 10*3/uL (ref 150–400)
RBC: 3.98 MIL/uL (ref 3.87–5.11)
RDW: 13.1 % (ref 11.5–15.5)
WBC: 18.4 10*3/uL — AB (ref 4.0–10.5)

## 2016-01-06 LAB — BASIC METABOLIC PANEL
ANION GAP: 8 (ref 5–15)
BUN: 17 mg/dL (ref 6–20)
CALCIUM: 8.7 mg/dL — AB (ref 8.9–10.3)
CO2: 26 mmol/L (ref 22–32)
CREATININE: 1.1 mg/dL — AB (ref 0.44–1.00)
Chloride: 94 mmol/L — ABNORMAL LOW (ref 101–111)
GFR, EST NON AFRICAN AMERICAN: 53 mL/min — AB (ref >60)
Glucose, Bld: 108 mg/dL — ABNORMAL HIGH (ref 65–99)
Potassium: 4 mmol/L (ref 3.5–5.1)
SODIUM: 128 mmol/L — AB (ref 135–145)

## 2016-01-06 MED ORDER — SODIUM CHLORIDE 0.9 % IV BOLUS (SEPSIS)
500.0000 mL | INTRAVENOUS | Status: AC
  Administered 2016-01-06: 500 mL via INTRAVENOUS

## 2016-01-06 NOTE — Progress Notes (Signed)
KIMIKA BOURBON is a 65 y.o. female patient admitted from ED awake, alert - oriented  X 4 - no acute distress noted.  VSS - Blood pressure 128/75, pulse 97, temperature 98.7 F (37.1 C), temperature source Oral, resp. rate 18, height 5\' 5"  (1.651 m), weight 73.5 kg (162 lb 1.6 oz), SpO2 98 %.    IV in place, occlusive dsg intact without redness.  Orientation to room, and floor completed with information packet given to patient/family.  Patient declined safety video at this time.  Admission INP armband ID verified with patient/family, and in place.   SR up x 2, fall assessment complete, with patient and family able to verbalize understanding of risk associated with falls, and verbalized understanding to call nsg before up out of bed.  Call light within reach, patient able to voice, and demonstrate understanding.  Skin, clean-dry- intact without evidence of bruising, or skin tears.   No evidence of skin break down noted on exam.     Will cont to eval and treat per MD orders.  Marcy Salvo, RN 01/06/2016 12:06 AM

## 2016-01-06 NOTE — Progress Notes (Signed)
[facility] Progress Note     Subjective: Abdominal pain controlled on pain medication, worse with movement, coughing. Denies fever, chills, night sweats, SOB, nausea, vomiting, light-headedness. Has not had a bowel movement since admission.  Objective: Vital signs in last 24 hours: Temp:  [97.6 F (36.4 C)-99.4 F (37.4 C)] 98.4 F (36.9 C) (11/16 0555) Pulse Rate:  [72-120] 72 (11/16 0555) Resp:  [17-20] 20 (11/16 0555) BP: (100-128)/(54-83) 121/78 (11/16 0555) SpO2:  [92 %-100 %] 92 % (11/16 0555) Weight:  [160 lb 8 oz (72.8 kg)-162 lb 1.6 oz (73.5 kg)] 162 lb 1.6 oz (73.5 kg) (11/16 0002) Last BM Date: 01/04/16  Intake/Output from previous day: 11/15 0701 - 11/16 0700 In: 1772.3 [I.V.:573.3; IV Piggyback:1199] Out: [id number] [Urine:1550] Intake/Output this shift: No intake/output data recorded.  PE: Gen:  Alert, NAD, pleasant and cooperative Pulm:  CTA, no W/R/R Abd: Soft, diffuse abdominal tenderness, worse over LLQ with involuntary guarding, no rebound tenderness or peritonitis,  +BS Ext:  No erythema, edema, or tenderness Neuro: A&Ox3, moves all extremities spontaneously   Lab Results:   Recent Labs  01/05/16 1804 01/06/16 0421  WBC 16.3* 18.4*  HGB 14.3 11.5*  HCT 41.2 33.7*  PLT 273 217   BMET  Recent Labs  01/05/16 1804 01/06/16 0421  NA 129* 128*  K 3.6 4.0  CL 92* 94*  CO2 24 26  GLUCOSE 128* 108*  BUN 20 17  CREATININE 1.11* 1.10*  CALCIUM 9.7 8.7*   CMP     Component Value Date/Time   NA 128 (L) 01/06/2016 0421   K 4.0 01/06/2016 0421   CL 94 (L) 01/06/2016 0421   CO2 26 01/06/2016 0421   GLUCOSE 108 (H) 01/06/2016 0421   BUN 17 01/06/2016 0421   CREATININE 1.10 (H) 01/06/2016 0421   CALCIUM 8.7 (L) 01/06/2016 0421   PROT 7.3 01/05/2016 1804   ALBUMIN 4.2 01/05/2016 1804   AST 29 01/05/2016 1804   ALT 24 01/05/2016 1804   ALKPHOS 47 01/05/2016 1804   BILITOT 1.6 (H) 01/05/2016 1804   GFRNONAA 53 (L) 01/06/2016 0421    GFRAA >60 01/06/2016 0421   Lipase     Component Value Date/Time   LIPASE 25 01/05/2016 1804   Studies/Results: Ct Abdomen Pelvis W Contrast  Result Date: 01/05/2016 CLINICAL DATA:  Abdominal pain, nausea, vomiting and diarrhea since yesterday. Leukocytosis to 16.3. EXAM: CT ABDOMEN AND PELVIS WITH CONTRAST TECHNIQUE: Multidetector CT imaging of the abdomen and pelvis was performed using the standard protocol following bolus administration of intravenous contrast. CONTRAST:  170mL ISOVUE-300 IOPAMIDOL (ISOVUE-300) INJECTION 61%, 37mL ISOVUE-300 IOPAMIDOL (ISOVUE-300) INJECTION 61% COMPARISON:  None. FINDINGS: Lower chest: Bibasilar dependent atelectasis. No effusion. The visualized cardiac chambers are within normal limits. No pericardial effusion. Hepatobiliary: The liver enhances homogeneously. No intrahepatic biliary dilatation. There is uncomplicated cholelithiasis with a 17 mm gallstone seen within the gallbladder. No wall thickening nor pericholecystic fluid. Pancreas: The pancreas is normal in appearance without space-occupying mass nor ductal dilatation. Spleen: The spleen is not enlarged. Adrenals/Urinary Tract: The adrenal glands are normal. There is a tiny right interpolar 3 mm hypodensity statistically consistent with a cyst but too small to further characterize. No obstructive uropathy. The bladder is physiologically distended. Stomach/Bowel: There is abnormal distention of the appendix to 12 mm in diameter. There is inflammatory thickening of the appendix which contains several appendicoliths, the largest is 41mm in the mid portion with smaller stones further distal (series 5 images 48-54, series 4 image  38). Extraluminal gas is seen adjacent to the appendix consistent with acute perforated appendicitis. There is a small amount of free fluid in the right hemipelvis without definite enhancement to suggest an abscess. Sympathetic thickening and inflammatory change noted of the distal and  terminal ileum. This results in moderate diffuse narrowing of the affected ileal loops causing more proximal jejunal fluid-filled distention and thickening. Findings may reflect small-bowel ileus and sympathetic inflammation. Vascular/Lymphatic: No significant vascular findings are present. No enlarged abdominal or pelvic lymph nodes. Reproductive: Uterus and bilateral adnexa are unremarkable. Other: Small amount of free fluid in the right lower quadrant and pelvis as stated. Musculoskeletal: No acute osseous abnormality. IMPRESSION: 1. Acute perforated appendicitis with periappendiceal inflammation and sympathetic thickening of the terminal and distal ileum causing more proximal small bowel distention and thickening. Small bowel distention and thickening of jejunal loops may represent a sympathetic response to the inflammation. Ileus is also present. Critical Value/emergent results were called by telephone at the time of interpretation on 01/05/2016 at 8:24 pm to Dr. Davonna Belling , who verbally acknowledged these results. 2. There are appendicoliths noted within the mid to distal appendix, the largest is in the mid appendix measuring 64mm. 3. Small amount of free fluid without enhancement noted in the pelvis and right lower quadrant. 4. Uncomplicated cholelithiasis. 5. 3 mm cyst in the right kidney too small to further characterize. Electronically Signed   By: Ashley Royalty M.D.   On: 01/05/2016 20:24    Anti-infectives: Anti-infectives    Start     Dose/Rate Route Frequency Ordered Stop   01/06/16 0015  cefTRIAXone (ROCEPHIN) 2 g in dextrose 5 % 50 mL IVPB     2 g 100 mL/hr over 30 Minutes Intravenous Daily at bedtime 01/05/16 2358     01/05/16 2358  metroNIDAZOLE (FLAGYL) IVPB 500 mg     500 mg 100 mL/hr over 60 Minutes Intravenous Every 8 hours 01/05/16 2358     01/05/16 2030  piperacillin-tazobactam (ZOSYN) IVPB 3.375 g     3.375 g 100 mL/hr over 30 Minutes Intravenous  Once 01/05/16 2021  01/05/16 2157     Assessment/Plan Acute appendicitis with perforation   Afebrile, VSS  Diffusely tender abdomen, no peritonitis  WBC 18.4 from 16.3 yesterday  Continue IV abx   Hyponatremia - 128, suspect secondary to hypovolemia/GI losses. hold thiazide today. Continue NS IVF. Re-check in AM.   FEN: NPO, IVF  ID: one-time dose Zosyn 11/15, Rocephin/Flagyl 11/15 >> VTE: Lovenox, SCD's  Dispo: IV abx, abdominal exams, AM labs   LOS: 0 days    Jill Alexanders , [facility] 01/06/2016, 8:04 AM Pager: [phone] Consults: [phone] Mon-Fri 7:00 am-4:30 pm Sat-Sun 7:00 am-11:30 am

## 2016-01-07 ENCOUNTER — Encounter (HOSPITAL_COMMUNITY): Payer: Self-pay | Admitting: *Deleted

## 2016-01-07 LAB — CBC
HCT: 32.3 % — ABNORMAL LOW (ref 36.0–46.0)
Hemoglobin: 11 g/dL — ABNORMAL LOW (ref 12.0–15.0)
MCH: 30.1 pg (ref 26.0–34.0)
MCHC: 34.1 g/dL (ref 30.0–36.0)
MCV: 88.5 fL (ref 78.0–100.0)
PLATELETS: 223 10*3/uL (ref 150–400)
RBC: 3.65 MIL/uL — AB (ref 3.87–5.11)
RDW: 13.8 % (ref 11.5–15.5)
WBC: 16.5 10*3/uL — ABNORMAL HIGH (ref 4.0–10.5)

## 2016-01-07 LAB — BASIC METABOLIC PANEL
Anion gap: 7 (ref 5–15)
BUN: 19 mg/dL (ref 6–20)
CALCIUM: 8.5 mg/dL — AB (ref 8.9–10.3)
CHLORIDE: 103 mmol/L (ref 101–111)
CO2: 24 mmol/L (ref 22–32)
CREATININE: 0.92 mg/dL (ref 0.44–1.00)
GFR calc non Af Amer: >60 mL/min (ref >60)
Glucose, Bld: 111 mg/dL — ABNORMAL HIGH (ref 65–99)
Potassium: 3.3 mmol/L — ABNORMAL LOW (ref 3.5–5.1)
Sodium: 134 mmol/L — ABNORMAL LOW (ref 135–145)

## 2016-01-07 MED ORDER — MORPHINE SULFATE (PF) 2 MG/ML IV SOLN
2.0000 mg | INTRAVENOUS | Status: DC | PRN
  Administered 2016-01-08 – 2016-01-11 (×5): 2 mg via INTRAVENOUS
  Filled 2016-01-07 (×5): qty 1

## 2016-01-07 MED ORDER — LIP MEDEX EX OINT
TOPICAL_OINTMENT | CUTANEOUS | Status: DC | PRN
  Administered 2016-01-07: 16:00:00 via TOPICAL
  Filled 2016-01-07: qty 7

## 2016-01-07 MED ORDER — HYDROCODONE-ACETAMINOPHEN 10-325 MG PO TABS
0.5000 | ORAL_TABLET | ORAL | Status: DC | PRN
  Administered 2016-01-07: 2 via ORAL
  Administered 2016-01-08 (×3): 1 via ORAL
  Administered 2016-01-09: 2 via ORAL
  Filled 2016-01-07: qty 2
  Filled 2016-01-07 (×2): qty 1
  Filled 2016-01-07: qty 2
  Filled 2016-01-07: qty 1
  Filled 2016-01-07: qty 2

## 2016-01-07 MED ORDER — KCL IN DEXTROSE-NACL 20-5-0.9 MEQ/L-%-% IV SOLN
INTRAVENOUS | Status: DC
  Administered 2016-01-07 – 2016-01-10 (×8): via INTRAVENOUS
  Administered 2016-01-10: 125 mL/h via INTRAVENOUS
  Administered 2016-01-11 – 2016-01-13 (×4): via INTRAVENOUS
  Filled 2016-01-07 (×18): qty 1000

## 2016-01-07 MED ORDER — CALCIUM CARBONATE ANTACID 500 MG PO CHEW
200.0000 mg | CHEWABLE_TABLET | Freq: Two times a day (BID) | ORAL | Status: DC | PRN
  Filled 2016-01-07: qty 1

## 2016-01-07 NOTE — Progress Notes (Signed)
Patient ID: Dana Hodge, female   DOB: 1950-03-21, 65 y.o.   MRN: [id number]   LOS: 1 day   Subjective: Pt developed some nausea but no emesis yesterday and last night which is new for this hospitalization. Pain seems to be improved.   Objective: Vital signs in last 24 hours: Temp:  [98.1 F (36.7 C)-100.6 F (38.1 C)] 98.3 F (36.8 C) (11/17 0453) Pulse Rate:  [80-101] 95 (11/17 0453) Resp:  [16-20] 16 (11/17 0453) BP: (83-107)/(43-53) 100/47 (11/17 0453) SpO2:  [95 %-100 %] 100 % (11/17 0453) Last BM Date: 01/04/16   Laboratory  CBC  Recent Labs  01/06/16 0421 01/07/16 0514  WBC 18.4* 16.5*  HGB 11.5* 11.0*  HCT 33.7* 32.3*  PLT 217 223   BMET  Recent Labs  01/06/16 0421 01/07/16 0514  NA 128* 134*  K 4.0 3.3*  CL 94* 103  CO2 26 24  GLUCOSE 108* 111*  BUN 17 19  CREATININE 1.10* 0.92  CALCIUM 8.7* 8.5*    Physical Exam General appearance: alert and no distress Resp: clear to auscultation bilaterally Cardio: regular rate and rhythm GI: Soft, +BS, Mild TTP right, moderate TTP left Extremities: NVI   Assessment/Plan: Acute appendicitis with perforation              Afebrile, VSS             Diffusely tender abdomen, no peritonitis             WBC 16.5 from 18.4 yesterday             Continue IV abx   Hyponatremia - improved to 134  FEN: clears, IVF  ID: one-time dose Zosyn 11/15, Rocephin/Flagyl 11/15 >> VTE: Lovenox, SCD's  Dispo: Continue clears, IV abx, IVF. Repeat CBC, BMET in am. Orals for pain.    Lisette Abu, PA-C Pager: [phone] 01/07/2016

## 2016-01-08 LAB — BASIC METABOLIC PANEL
ANION GAP: 4 — AB (ref 5–15)
BUN: 14 mg/dL (ref 6–20)
CHLORIDE: 108 mmol/L (ref 101–111)
CO2: 24 mmol/L (ref 22–32)
Calcium: 8.2 mg/dL — ABNORMAL LOW (ref 8.9–10.3)
Creatinine, Ser: 0.8 mg/dL (ref 0.44–1.00)
GFR calc Af Amer: >60 mL/min (ref >60)
GLUCOSE: 143 mg/dL — AB (ref 65–99)
POTASSIUM: 3.7 mmol/L (ref 3.5–5.1)
Sodium: 136 mmol/L (ref 135–145)

## 2016-01-08 LAB — CBC
HEMATOCRIT: 28.8 % — AB (ref 36.0–46.0)
HEMOGLOBIN: 9.8 g/dL — AB (ref 12.0–15.0)
MCH: 29.9 pg (ref 26.0–34.0)
MCHC: 34 g/dL (ref 30.0–36.0)
MCV: 87.8 fL (ref 78.0–100.0)
PLATELETS: 249 10*3/uL (ref 150–400)
RBC: 3.28 MIL/uL — AB (ref 3.87–5.11)
RDW: 13.7 % (ref 11.5–15.5)
WBC: 15.4 10*3/uL — AB (ref 4.0–10.5)

## 2016-01-08 MED ORDER — PIPERACILLIN-TAZOBACTAM 3.375 G IVPB
3.3750 g | Freq: Three times a day (TID) | INTRAVENOUS | Status: DC
  Administered 2016-01-08 – 2016-01-13 (×16): 3.375 g via INTRAVENOUS
  Filled 2016-01-08 (×17): qty 50

## 2016-01-08 MED ORDER — PROMETHAZINE HCL 25 MG/ML IJ SOLN
12.5000 mg | Freq: Four times a day (QID) | INTRAMUSCULAR | Status: DC | PRN
Start: 2016-01-08 — End: 2016-01-13
  Administered 2016-01-08 – 2016-01-11 (×4): 12.5 mg via INTRAVENOUS
  Filled 2016-01-08 (×4): qty 1

## 2016-01-08 NOTE — Progress Notes (Signed)
Assessment Active Problems:   Perforated appendicitis with RLQ phlegmon-on IV Rocephin and Flagyl; slight decrease in WBC- a little better clinically  ABL anemia  Plan:  Change to Zosyn.  Repeat CBC tomorrow.   LOS: 2 days        Subjective: Feels better today.  Less pain.  No nausea.  Passed a little gas.  Objective: Vital signs in last 24 hours: Temp:  [98 F (36.7 C)-98.7 F (37.1 C)] 98.7 F (37.1 C) (11/18 0545) Pulse Rate:  [91-100] 92 (11/18 0545) Resp:  [16-20] 16 (11/18 0545) BP: (97-119)/(56-61) 97/56 (11/18 0545) SpO2:  [93 %-98 %] 97 % (11/18 0545) Last BM Date: 01/04/16  Intake/Output from previous day: 11/17 0701 - 11/18 0700 In: 2932.9 [P.O.:310; I.V.:2272.9; IV Piggyback:350] Out: 545 [Urine:545] Intake/Output this shift: No intake/output data recorded.  PE: General- In NAD Abdomen-soft, some distension, hypoactive bowel sounds, RLQ tenderness  Lab Results:   Recent Labs  01/07/16 0514 01/08/16 0436  WBC 16.5* 15.4*  HGB 11.0* 9.8*  HCT 32.3* 28.8*  PLT 223 249   BMET  Recent Labs  01/07/16 0514 01/08/16 0436  NA 134* 136  K 3.3* 3.7  CL 103 108  CO2 24 24  GLUCOSE 111* 143*  BUN 19 14  CREATININE 0.92 0.80  CALCIUM 8.5* 8.2*   PT/INR No results for input(s): LABPROT, INR in the last 72 hours. Comprehensive Metabolic Panel:    Component Value Date/Time   NA 136 01/08/2016 0436   NA 134 (L) 01/07/2016 0514   K 3.7 01/08/2016 0436   K 3.3 (L) 01/07/2016 0514   CL 108 01/08/2016 0436   CL 103 01/07/2016 0514   CO2 24 01/08/2016 0436   CO2 24 01/07/2016 0514   BUN 14 01/08/2016 0436   BUN 19 01/07/2016 0514   CREATININE 0.80 01/08/2016 0436   CREATININE 0.92 01/07/2016 0514   GLUCOSE 143 (H) 01/08/2016 0436   GLUCOSE 111 (H) 01/07/2016 0514   CALCIUM 8.2 (L) 01/08/2016 0436   CALCIUM 8.5 (L) 01/07/2016 0514   AST 29 01/05/2016 1804   AST 22 12/31/2009 1016   ALT 24 01/05/2016 1804   ALT 21 12/31/2009 1016   ALKPHOS 47  01/05/2016 1804   ALKPHOS 58 12/31/2009 1016   BILITOT 1.6 (H) 01/05/2016 1804   BILITOT 0.5 12/31/2009 1016   PROT 7.3 01/05/2016 1804   PROT 6.5 12/31/2009 1016   ALBUMIN 4.2 01/05/2016 1804   ALBUMIN 4.0 12/31/2009 1016     Studies/Results: No results found.  Anti-infectives: Anti-infectives    Start     Dose/Rate Route Frequency Ordered Stop   01/06/16 0015  cefTRIAXone (ROCEPHIN) 2 g in dextrose 5 % 50 mL IVPB  Status:  Discontinued     2 g 100 mL/hr over 30 Minutes Intravenous Daily at bedtime 01/05/16 2358 01/08/16 0756   01/05/16 2358  metroNIDAZOLE (FLAGYL) IVPB 500 mg  Status:  Discontinued     500 mg 100 mL/hr over 60 Minutes Intravenous Every 8 hours 01/05/16 2358 01/08/16 0756   01/05/16 2030  piperacillin-tazobactam (ZOSYN) IVPB 3.375 g     3.375 g 100 mL/hr over 30 Minutes Intravenous  Once 01/05/16 2021 01/05/16 2157       Dana Hodge J 01/08/2016

## 2016-01-08 NOTE — Progress Notes (Signed)
Pharmacy Antibiotic Note  Dana Hodge is a 65 y.o. female admitted on 01/05/2016 with perforated appendicitis.  He is currently on Rocephin/Flagyl, bowel rest for medical mgmt.  Pharmacy has been consulted to change antibiotics to Zosyn.  01/08/2016:  Afebrile  Renal function improved to baseline  WBC trending down  Plan: Zosyn 3.375g IV q8h (4 hour infusion).  No further dose adjustments anticipated- Pharmacy to sign off.  Please re-consult as needed.   Height: 5\' 5"  (165.1 cm) Weight: 162 lb 1.6 oz (73.5 kg) IBW/kg (Calculated) : 57  Temp (24hrs), Avg:98.3 F (36.8 C), Min:98 F (36.7 C), Max:98.7 F (37.1 C)   Recent Labs Lab 01/05/16 1804 01/06/16 0421 01/07/16 0514 01/08/16 0436  WBC 16.3* 18.4* 16.5* 15.4*  CREATININE 1.11* 1.10* 0.92 0.80    Estimated Creatinine Clearance: 73.2 mL/min (by C-G formula based on SCr of 0.8 mg/dL).    No Known Allergies  Antimicrobials this admission: Zosyn 11/15 x1 dose; 11/18 >>  Rocephin 11/16 >> 11/18 Flagyl 11/16 >>11/18  Dose adjustments this admission:  Microbiology results: none  Thank you for allowing pharmacy to be a part of this patient's care.  Biagio Borg 01/08/2016 8:01 AM

## 2016-01-09 LAB — CBC
HEMATOCRIT: 30 % — AB (ref 36.0–46.0)
Hemoglobin: 10.2 g/dL — ABNORMAL LOW (ref 12.0–15.0)
MCH: 29.7 pg (ref 26.0–34.0)
MCHC: 34 g/dL (ref 30.0–36.0)
MCV: 87.5 fL (ref 78.0–100.0)
Platelets: 297 10*3/uL (ref 150–400)
RBC: 3.43 MIL/uL — AB (ref 3.87–5.11)
RDW: 13.6 % (ref 11.5–15.5)
WBC: 13.4 10*3/uL — AB (ref 4.0–10.5)

## 2016-01-09 NOTE — Progress Notes (Signed)
Assessment Active Problems:   Perforated appendicitis with RLQ phlegmon-on IV Zosyn; WBC trending down   ABL anemia-stable  Plan:  Repeat CT Monday.  Continue IV  Zosyn.  Repeat CBC tomorrow.   LOS: 3 days        Subjective: Had a rough day yesterday because of nausea and bloating.  Both have decreased this AM.  Objective: Vital signs in last 24 hours: Temp:  [98.6 F (37 C)-99.1 F (37.3 C)] 99.1 F (37.3 C) (11/19 0602) Pulse Rate:  [88-91] 88 (11/19 0602) Resp:  [16] 16 (11/19 0602) BP: (116-132)/(67-77) 116/77 (11/19 0602) SpO2:  [97 %-98 %] 98 % (11/19 0602) Last BM Date: 01/04/16  Intake/Output from previous day: 11/18 0701 - 11/19 0700 In: 2709.6 [P.O.:120; I.V.:2439.6; IV Piggyback:150] Out: 1050 [Urine:1050] Intake/Output this shift: No intake/output data recorded.  PE: General- In NAD Abdomen-soft, some distension, hypoactive bowel sounds, less RLQ tenderness  Lab Results:   Recent Labs  01/08/16 0436 01/09/16 0444  WBC 15.4* 13.4*  HGB 9.8* 10.2*  HCT 28.8* 30.0*  PLT 249 297   BMET  Recent Labs  01/07/16 0514 01/08/16 0436  NA 134* 136  K 3.3* 3.7  CL 103 108  CO2 24 24  GLUCOSE 111* 143*  BUN 19 14  CREATININE 0.92 0.80  CALCIUM 8.5* 8.2*   PT/INR No results for input(s): LABPROT, INR in the last 72 hours. Comprehensive Metabolic Panel:    Component Value Date/Time   NA 136 01/08/2016 0436   NA 134 (L) 01/07/2016 0514   K 3.7 01/08/2016 0436   K 3.3 (L) 01/07/2016 0514   CL 108 01/08/2016 0436   CL 103 01/07/2016 0514   CO2 24 01/08/2016 0436   CO2 24 01/07/2016 0514   BUN 14 01/08/2016 0436   BUN 19 01/07/2016 0514   CREATININE 0.80 01/08/2016 0436   CREATININE 0.92 01/07/2016 0514   GLUCOSE 143 (H) 01/08/2016 0436   GLUCOSE 111 (H) 01/07/2016 0514   CALCIUM 8.2 (L) 01/08/2016 0436   CALCIUM 8.5 (L) 01/07/2016 0514   AST 29 01/05/2016 1804   AST 22 12/31/2009 1016   ALT 24 01/05/2016 1804   ALT 21 12/31/2009 1016   ALKPHOS 47 01/05/2016 1804   ALKPHOS 58 12/31/2009 1016   BILITOT 1.6 (H) 01/05/2016 1804   BILITOT 0.5 12/31/2009 1016   PROT 7.3 01/05/2016 1804   PROT 6.5 12/31/2009 1016   ALBUMIN 4.2 01/05/2016 1804   ALBUMIN 4.0 12/31/2009 1016     Studies/Results: No results found.  Anti-infectives: Anti-infectives    Start     Dose/Rate Route Frequency Ordered Stop   01/08/16 1000  piperacillin-tazobactam (ZOSYN) IVPB 3.375 g     3.375 g 12.5 mL/hr over 240 Minutes Intravenous Every 8 hours 01/08/16 0808     01/06/16 0015  cefTRIAXone (ROCEPHIN) 2 g in dextrose 5 % 50 mL IVPB  Status:  Discontinued     2 g 100 mL/hr over 30 Minutes Intravenous Daily at bedtime 01/05/16 2358 01/08/16 0756   01/05/16 2358  metroNIDAZOLE (FLAGYL) IVPB 500 mg  Status:  Discontinued     500 mg 100 mL/hr over 60 Minutes Intravenous Every 8 hours 01/05/16 2358 01/08/16 0756   01/05/16 2030  piperacillin-tazobactam (ZOSYN) IVPB 3.375 g     3.375 g 100 mL/hr over 30 Minutes Intravenous  Once 01/05/16 2021 01/05/16 2157       Saifullah Jolley J 01/09/2016

## 2016-01-09 NOTE — Progress Notes (Signed)
Pt vomited 2L of bilious liquid shortly after receiving IV nausea medication.  MD on call paged. Pt made NPO with orders to place NG tube if she vomits again.  Pt ambulated x2 and showered after event. Will continue to monitor.  Roselind Rily

## 2016-01-10 ENCOUNTER — Encounter (HOSPITAL_COMMUNITY): Payer: Self-pay | Admitting: Radiology

## 2016-01-10 ENCOUNTER — Inpatient Hospital Stay (HOSPITAL_COMMUNITY): Payer: 59

## 2016-01-10 LAB — CBC
HEMATOCRIT: 27.4 % — AB (ref 36.0–46.0)
HEMOGLOBIN: 9.3 g/dL — AB (ref 12.0–15.0)
MCH: 29.7 pg (ref 26.0–34.0)
MCHC: 33.9 g/dL (ref 30.0–36.0)
MCV: 87.5 fL (ref 78.0–100.0)
Platelets: 293 10*3/uL (ref 150–400)
RBC: 3.13 MIL/uL — ABNORMAL LOW (ref 3.87–5.11)
RDW: 13.7 % (ref 11.5–15.5)
WBC: 11.8 10*3/uL — ABNORMAL HIGH (ref 4.0–10.5)

## 2016-01-10 MED ORDER — IOPAMIDOL (ISOVUE-300) INJECTION 61%
100.0000 mL | Freq: Once | INTRAVENOUS | Status: AC | PRN
  Administered 2016-01-10: 100 mL via INTRAVENOUS

## 2016-01-10 MED ORDER — IOPAMIDOL (ISOVUE-300) INJECTION 61%
INTRAVENOUS | Status: AC
  Filled 2016-01-10: qty 30

## 2016-01-10 MED ORDER — IOPAMIDOL (ISOVUE-300) INJECTION 61%
15.0000 mL | Freq: Once | INTRAVENOUS | Status: DC | PRN
  Administered 2016-01-10: 15 mL via ORAL
  Filled 2016-01-10: qty 30

## 2016-01-10 MED ORDER — IOPAMIDOL (ISOVUE-300) INJECTION 61%
INTRAVENOUS | Status: AC
  Filled 2016-01-10: qty 100

## 2016-01-10 NOTE — Progress Notes (Signed)
[facility] Progress Note     Subjective: NAE. Mild abdominal soreness. Denies fever, chills, nausea, or vomiting. +BM that was loose and non-bloody. Ambulating. Pulling 1200 cc on IS.  Objective: Vital signs in last 24 hours: Temp:  [98 F (36.7 C)-98.4 F (36.9 C)] 98 F (36.7 C) (11/20 0537) Pulse Rate:  [85-92] 90 (11/20 0537) Resp:  [16] 16 (11/20 0537) BP: (135-143)/(78-84) 135/80 (11/20 0537) SpO2:  [95 %-100 %] 99 % (11/20 0537) Last BM Date: 01/04/16  Intake/Output from previous day: 11/19 0701 - 11/20 0700 In: 3250 [P.O.:50; I.V.:3000; IV Piggyback:200] Out: 2350 [Urine:2100; Stool:250] Intake/Output this shift: No intake/output data recorded.  PE: Gen:  Alert, NAD, pleasant Pulm:  CTA, no W/R/R Abd: Soft, mild TTP over RLQ/suprapubic region, ND, +BS  Lab Results:   Recent Labs  01/09/16 0444 01/10/16 0357  WBC 13.4* 11.8*  HGB 10.2* 9.3*  HCT 30.0* 27.4*  PLT 297 293   BMET  Recent Labs  01/08/16 0436  NA 136  K 3.7  CL 108  CO2 24  GLUCOSE 143*  BUN 14  CREATININE 0.80  CALCIUM 8.2*   PT/INR No results for input(s): LABPROT, INR in the last 72 hours. CMP     Component Value Date/Time   NA 136 01/08/2016 0436   K 3.7 01/08/2016 0436   CL 108 01/08/2016 0436   CO2 24 01/08/2016 0436   GLUCOSE 143 (H) 01/08/2016 0436   BUN 14 01/08/2016 0436   CREATININE 0.80 01/08/2016 0436   CALCIUM 8.2 (L) 01/08/2016 0436   PROT 7.3 01/05/2016 1804   ALBUMIN 4.2 01/05/2016 1804   AST 29 01/05/2016 1804   ALT 24 01/05/2016 1804   ALKPHOS 47 01/05/2016 1804   BILITOT 1.6 (H) 01/05/2016 1804   GFRNONAA >60 01/08/2016 0436   GFRAA >60 01/08/2016 0436   Lipase     Component Value Date/Time   LIPASE 25 01/05/2016 1804   Studies/Results: No results found.  Anti-infectives: Anti-infectives    Start     Dose/Rate Route Frequency Ordered Stop   01/08/16 1000  piperacillin-tazobactam (ZOSYN) IVPB 3.375 g     3.375 g 12.5 mL/hr  over 240 Minutes Intravenous Every 8 hours 01/08/16 0808     01/06/16 0015  cefTRIAXone (ROCEPHIN) 2 g in dextrose 5 % 50 mL IVPB  Status:  Discontinued     2 g 100 mL/hr over 30 Minutes Intravenous Daily at bedtime 01/05/16 2358 01/08/16 0756   01/05/16 2358  metroNIDAZOLE (FLAGYL) IVPB 500 mg  Status:  Discontinued     500 mg 100 mL/hr over 60 Minutes Intravenous Every 8 hours 01/05/16 2358 01/08/16 0756   01/05/16 2030  piperacillin-tazobactam (ZOSYN) IVPB 3.375 g     3.375 g 100 mL/hr over 30 Minutes Intravenous  Once 01/05/16 2021 01/05/16 2157     Assessment/Plan Perforated appendicitis with RLQ Phlegmon  Repeat CT Abd/Pelv today to evaluate phlegmon/possible abscess  Rocephin/Flagyl 11/16-11/17, IV Zosyn 11/18 >>  WBC 11.8, trending down    FEN: NPO, IVF  VTE: Lovenox, SCD's     LOS: 4 days    Jill Alexanders , [facility] 01/10/2016, 8:40 AM Pager: [phone] Consults: [phone] Mon-Fri 7:00 am-4:30 pm Sat-Sun 7:00 am-11:30 am

## 2016-01-11 NOTE — Progress Notes (Addendum)
[facility] Progress Note     Subjective: Nausea improved after phenergan yesterday evening. Patient "thinks" her abdominal pain is improving compared to the weekend - required no pain medication yesterday until bed time. Loose, watery stool last night. Ambulating. Using IS.  Patient is kind but frustrated about her diagnosis. I discussed the CT results with the patient and her husband who was on speaker phone. I explained the enlargement of her periappendiceal fluid collection as well as her new fluid collection and that IR cannot safely drain those areas. We discussed the medical reasoning behind delaying surgery in patients with significant inflammation/phlegmon formation.   Objective: Vital signs in last 24 hours: Temp:  [98.4 F (36.9 C)-99.5 F (37.5 C)] 98.4 F (36.9 C) (11/21 0538) Pulse Rate:  [84-88] 84 (11/21 0538) Resp:  [16-18] 16 (11/21 0538) BP: (131-145)/(81-86) 131/81 (11/21 0538) SpO2:  [97 %-99 %] 99 % (11/21 0538) Last BM Date: 01/11/16  Intake/Output from previous day: 11/20 0701 - 11/21 0700 In: 2975 [I.V.:2875; IV Piggyback:100] Out: 2225 [Urine:2225] Intake/Output this shift: No intake/output data recorded.  PE: Gen:  Alert, NAD, pleasant Pulm:  CTA, no W/R/R Abd: Soft, mild TTP over abdomen with guarding, mild distention, +BS, no rebound tenderness  Lab Results:   Recent Labs  01/09/16 0444 01/10/16 0357  WBC 13.4* 11.8*  HGB 10.2* 9.3*  HCT 30.0* 27.4*  PLT 297 293   BMET No results for input(s): NA, K, CL, CO2, GLUCOSE, BUN, CREATININE, CALCIUM in the last 72 hours. PT/INR No results for input(s): LABPROT, INR in the last 72 hours. CMP     Component Value Date/Time   NA 136 01/08/2016 0436   K 3.7 01/08/2016 0436   CL 108 01/08/2016 0436   CO2 24 01/08/2016 0436   GLUCOSE 143 (H) 01/08/2016 0436   BUN 14 01/08/2016 0436   CREATININE 0.80 01/08/2016 0436   CALCIUM 8.2 (L) 01/08/2016 0436   PROT 7.3 01/05/2016 1804    ALBUMIN 4.2 01/05/2016 1804   AST 29 01/05/2016 1804   ALT 24 01/05/2016 1804   ALKPHOS 47 01/05/2016 1804   BILITOT 1.6 (H) 01/05/2016 1804   GFRNONAA >60 01/08/2016 0436   GFRAA >60 01/08/2016 0436   Lipase     Component Value Date/Time   LIPASE 25 01/05/2016 1804   Studies/Results: Ct Abdomen Pelvis W Contrast  Result Date: 01/10/2016 CLINICAL DATA:  Rupture appendicitis.  Re-evaluation EXAM: CT ABDOMEN AND PELVIS WITH CONTRAST TECHNIQUE: Multidetector CT imaging of the abdomen and pelvis was performed using the standard protocol following bolus administration of intravenous contrast. CONTRAST:  123mL ISOVUE-300 IOPAMIDOL (ISOVUE-300) INJECTION 61% COMPARISON:  CT 01/05/2016 FINDINGS: Lower chest: New bilateral pleural effusions and basilar atelectasis. Hepatobiliary: No focal hepatic lesion. Gallstone within lumen gallbladder. No gallbladder distension. No biliary duct dilatation. Pancreas: No evidence of pancreatic inflammation. Small of fluid caudad to the pancreatic tail measures 4 cm by 2.1 cm is new. Spleen: Normal spleen Adrenals/urinary tract: Adrenal glands and kidneys are normal. Bladder normal. Stomach/Bowel: Stomach is normal. There is poor progression of oral contrast. The proximal small bowel is dilated to 3.5 cm. The distal small bowel is collapsed leading up to the terminal ileum. No clear transition point is identified. There is inflammation within the leaves of the mesentery of the RIGHT lower quadrant (image 74, series 2) Acute appendicitis again noted. There is rupture of the tip of the appendix with a 2.5 x 3.0 cm gas and fluid collection (image 66, series 2)  increased slightly from 1.8 x 1.5 cm. New fluid collecting within the small bowel mesenteric adjacent to the appendiceal abscess with thin enhancing rim measuring 4.0 x 2.7 cm (image 67, series 2) This collection is also seen on image 34, series 4 No intraperitoneal free air. Vascular/Lymphatic: Abdominal aorta is normal  caliber. There is no retroperitoneal or periportal lymphadenopathy. No pelvic lymphadenopathy. Reproductive: Uterus and ovaries normal. Other: Small amount free fluid the pelvis also with enhancing surface suggesting peritonitis (image 79, series 2. New free fluid in the upper abdomen associated dilated loops of small bowel along the margin liver which is not organized. Musculoskeletal: No aggressive osseous lesion. IMPRESSION: 1. Interval enlargement of the appendiceal abscess at the tip of the appendix. 2. New abscess formation within the adjacent mesentery of the small bowel 3. Progression of oral contrast with transition from dilated small bowel proximally to collapsed small bowel distally. Favor findings to represent ileus associated mesenteric abscess. Cannot exclude small bowel obstruction. 4. New free fluid in the upper abdomen along the pericolic gutters and leaves of the mesentery which is not organized. 5. Small amount fluid the pelvis with enhancing surface consistent with peritonitis. These results will be called to the ordering clinician or representative by the Radiologist Assistant, and communication documented in the PACS or zVision Dashboard. Electronically Signed   By: Suzy Bouchard M.D.   On: 01/10/2016 16:46    Anti-infectives: Anti-infectives    Start     Dose/Rate Route Frequency Ordered Stop   01/08/16 1000  piperacillin-tazobactam (ZOSYN) IVPB 3.375 g     3.375 g 12.5 mL/hr over 240 Minutes Intravenous Every 8 hours 01/08/16 0808     01/06/16 0015  cefTRIAXone (ROCEPHIN) 2 g in dextrose 5 % 50 mL IVPB  Status:  Discontinued     2 g 100 mL/hr over 30 Minutes Intravenous Daily at bedtime 01/05/16 2358 01/08/16 0756   01/05/16 2358  metroNIDAZOLE (FLAGYL) IVPB 500 mg  Status:  Discontinued     500 mg 100 mL/hr over 60 Minutes Intravenous Every 8 hours 01/05/16 2358 01/08/16 0756   01/05/16 2030  piperacillin-tazobactam (ZOSYN) IVPB 3.375 g     3.375 g 100 mL/hr over 30  Minutes Intravenous  Once 01/05/16 2021 01/05/16 2157     Assessment/Plan Perforated appendicitis with RLQ Phlegmon - Repeat CT Abd/Pelv 11/20 interval increase in RLQ fluid collection 2.5 x 3.0 cm, new fluid collection within small bowel mesentery 4.0 x 2.7 cm - Appreciate IR reviewing CT scan, no safe window for percutaneous drainage  - WBC 11.8 yeserday, trending down - ileus: Zofran PRN for nausea, bowel regimen               FEN: NPO, sips/chips ID: Rocephin/Flagyl 11/16-11/17, IV Zosyn 11/18 >> VTE: Lovenox, SCD's   Dispo: Will discuss surgical plan with MD - continue IV abx and allow sips of water/ice chips    LOS: 5 days    Jill Alexanders , [facility] 01/11/2016, 8:53 AM Pager: [phone] Consults: [phone] Mon-Fri 7:00 am-4:30 pm Sat-Sun 7:00 am-11:30 am  Agree with above. No nauseated and has had BM. Agree with sips of liquids from the floor.  Possibly start clr liquids tomorrow.  Note:  I did her husband's appendectomy in 2011.  His appendix was also ruptured.  They have a disabled son at home, so that limits the husband's time at the hospital.  Alphonsa Overall, MD, [facility] Surgery Pager: [phone] Office phone:  [phone]

## 2016-01-12 NOTE — Progress Notes (Signed)
[facility] Progress Note     Subjective: Abdominal pain improving - more localized to RLQ whereas before the pain was all over the lower abdomen. Mild nausea with hanging of new bag abx, controlled with zofran. Having loose BMs about 2 times daily. Denies vomiting.  Husband and son in the room with her today. All questions were answered.  Objective: Vital signs in last 24 hours: Temp:  [98.1 F (36.7 C)-99.1 F (37.3 C)] 98.1 F (36.7 C) (11/22 0540) Pulse Rate:  [83-90] 83 (11/22 0540) Resp:  [18] 18 (11/22 0540) BP: (138-148)/(78-86) 144/81 (11/22 1000) SpO2:  [95 %-99 %] 95 % (11/22 0540) Last BM Date: 01/12/16  Intake/Output from previous day: 11/21 0701 - 11/22 0700 In: 2475 [I.V.:2375; IV Piggyback:100] Out: 1850 [Urine:1850] Intake/Output this shift: Total I/O In: -  Out: 300 [Urine:300]  PE: Gen:  Alert, NAD, pleasant and cooperative Pulm:  CTA, no W/R/R Abd: Soft, mild tenderness to palpation RLQ, ND, +BS Ext:  No erythema, edema, or tenderness   Lab Results:   Recent Labs  01/10/16 0357  WBC 11.8*  HGB 9.3*  HCT 27.4*  PLT 293   BMET No results for input(s): NA, K, CL, CO2, GLUCOSE, BUN, CREATININE, CALCIUM in the last 72 hours. PT/INR No results for input(s): LABPROT, INR in the last 72 hours. CMP     Component Value Date/Time   NA 136 01/08/2016 0436   K 3.7 01/08/2016 0436   CL 108 01/08/2016 0436   CO2 24 01/08/2016 0436   GLUCOSE 143 (H) 01/08/2016 0436   BUN 14 01/08/2016 0436   CREATININE 0.80 01/08/2016 0436   CALCIUM 8.2 (L) 01/08/2016 0436   PROT 7.3 01/05/2016 1804   ALBUMIN 4.2 01/05/2016 1804   AST 29 01/05/2016 1804   ALT 24 01/05/2016 1804   ALKPHOS 47 01/05/2016 1804   BILITOT 1.6 (H) 01/05/2016 1804   GFRNONAA >60 01/08/2016 0436   GFRAA >60 01/08/2016 0436   Lipase     Component Value Date/Time   LIPASE 25 01/05/2016 1804       Studies/Results: Ct Abdomen Pelvis W Contrast  Result Date:  01/10/2016 CLINICAL DATA:  Rupture appendicitis.  Re-evaluation EXAM: CT ABDOMEN AND PELVIS WITH CONTRAST TECHNIQUE: Multidetector CT imaging of the abdomen and pelvis was performed using the standard protocol following bolus administration of intravenous contrast. CONTRAST:  156mL ISOVUE-300 IOPAMIDOL (ISOVUE-300) INJECTION 61% COMPARISON:  CT 01/05/2016 FINDINGS: Lower chest: New bilateral pleural effusions and basilar atelectasis. Hepatobiliary: No focal hepatic lesion. Gallstone within lumen gallbladder. No gallbladder distension. No biliary duct dilatation. Pancreas: No evidence of pancreatic inflammation. Small of fluid caudad to the pancreatic tail measures 4 cm by 2.1 cm is new. Spleen: Normal spleen Adrenals/urinary tract: Adrenal glands and kidneys are normal. Bladder normal. Stomach/Bowel: Stomach is normal. There is poor progression of oral contrast. The proximal small bowel is dilated to 3.5 cm. The distal small bowel is collapsed leading up to the terminal ileum. No clear transition point is identified. There is inflammation within the leaves of the mesentery of the RIGHT lower quadrant (image 74, series 2) Acute appendicitis again noted. There is rupture of the tip of the appendix with a 2.5 x 3.0 cm gas and fluid collection (image 66, series 2) increased slightly from 1.8 x 1.5 cm. New fluid collecting within the small bowel mesenteric adjacent to the appendiceal abscess with thin enhancing rim measuring 4.0 x 2.7 cm (image 67, series 2) This collection is also seen on  image 34, series 4 No intraperitoneal free air. Vascular/Lymphatic: Abdominal aorta is normal caliber. There is no retroperitoneal or periportal lymphadenopathy. No pelvic lymphadenopathy. Reproductive: Uterus and ovaries normal. Other: Small amount free fluid the pelvis also with enhancing surface suggesting peritonitis (image 79, series 2. New free fluid in the upper abdomen associated dilated loops of small bowel along the margin  liver which is not organized. Musculoskeletal: No aggressive osseous lesion. IMPRESSION: 1. Interval enlargement of the appendiceal abscess at the tip of the appendix. 2. New abscess formation within the adjacent mesentery of the small bowel 3. Progression of oral contrast with transition from dilated small bowel proximally to collapsed small bowel distally. Favor findings to represent ileus associated mesenteric abscess. Cannot exclude small bowel obstruction. 4. New free fluid in the upper abdomen along the pericolic gutters and leaves of the mesentery which is not organized. 5. Small amount fluid the pelvis with enhancing surface consistent with peritonitis. These results will be called to the ordering clinician or representative by the Radiologist Assistant, and communication documented in the PACS or zVision Dashboard. Electronically Signed   By: Suzy Bouchard M.D.   On: 01/10/2016 16:46    Anti-infectives: Anti-infectives    Start     Dose/Rate Route Frequency Ordered Stop   01/08/16 1000  piperacillin-tazobactam (ZOSYN) IVPB 3.375 g     3.375 g 12.5 mL/hr over 240 Minutes Intravenous Every 8 hours 01/08/16 0808     01/06/16 0015  cefTRIAXone (ROCEPHIN) 2 g in dextrose 5 % 50 mL IVPB  Status:  Discontinued     2 g 100 mL/hr over 30 Minutes Intravenous Daily at bedtime 01/05/16 2358 01/08/16 0756   01/05/16 2358  metroNIDAZOLE (FLAGYL) IVPB 500 mg  Status:  Discontinued     500 mg 100 mL/hr over 60 Minutes Intravenous Every 8 hours 01/05/16 2358 01/08/16 0756   01/05/16 2030  piperacillin-tazobactam (ZOSYN) IVPB 3.375 g     3.375 g 100 mL/hr over 30 Minutes Intravenous  Once 01/05/16 2021 01/05/16 2157     Assessment/Plan Perforated appendicitis with RLQ Phlegmon - Repeat CT Abd/Pelv 11/20 interval increase in RLQ fluid collection 2.5 x 3.0 cm, new fluid collection within small bowel mesentery 4.0 x 2.7 cm - Appreciate IR reviewing CT scan, no safe window for percutaneous drainage  -  WBC 11.8 yeserday, trending down - ileus: Zofran PRN for nausea, bowel regimen   FEN: clear liquid diet ID: Rocephin/Flagyl 11/16-11/17, IV Zosyn 11/18 >> (day#7) VTE: Lovenox, SCD's   Dispo: advance diet to clear liquid tray, continue IV abx  CBC in AM   LOS: 6 days    Jill Alexanders , [facility] 01/12/2016, 11:12 AM Pager: [phone] Consults: [phone] Mon-Fri 7:00 am-4:30 pm Sat-Sun 7:00 am-11:30 am

## 2016-01-13 LAB — CBC
HCT: 29.9 % — ABNORMAL LOW (ref 36.0–46.0)
Hemoglobin: 10.3 g/dL — ABNORMAL LOW (ref 12.0–15.0)
MCH: 29.7 pg (ref 26.0–34.0)
MCHC: 34.4 g/dL (ref 30.0–36.0)
MCV: 86.2 fL (ref 78.0–100.0)
PLATELETS: 388 10*3/uL (ref 150–400)
RBC: 3.47 MIL/uL — AB (ref 3.87–5.11)
RDW: 14.1 % (ref 11.5–15.5)
WBC: 14.4 10*3/uL — AB (ref 4.0–10.5)

## 2016-01-13 MED ORDER — ACETAMINOPHEN 650 MG RE SUPP: 650.0000 mg | Freq: Four times a day (QID) | RECTAL | Status: DC | PRN

## 2016-01-13 MED ORDER — ENALAPRILAT 1.25 MG/ML IV SOLN
0.6250 mg | Freq: Four times a day (QID) | INTRAVENOUS | Status: DC | PRN
  Filled 2016-01-13: qty 1

## 2016-01-13 MED ORDER — METOPROLOL TARTRATE 5 MG/5ML IV SOLN
5.0000 mg | Freq: Four times a day (QID) | INTRAVENOUS | Status: DC | PRN
Start: 2016-01-13 — End: 2016-01-21

## 2016-01-13 MED ORDER — SODIUM CHLORIDE 0.9% FLUSH: 3.0000 mL | INTRAVENOUS | Status: DC | PRN

## 2016-01-13 MED ORDER — SODIUM CHLORIDE 0.9% FLUSH
3.0000 mL | Freq: Two times a day (BID) | INTRAVENOUS | Status: DC
  Administered 2016-01-13 – 2016-01-21 (×12): 3 mL via INTRAVENOUS

## 2016-01-13 MED ORDER — LIP MEDEX EX OINT
1.0000 "application " | TOPICAL_OINTMENT | Freq: Two times a day (BID) | CUTANEOUS | Status: DC
  Administered 2016-01-13 – 2016-01-21 (×15): 1 via TOPICAL
  Filled 2016-01-13 (×2): qty 7

## 2016-01-13 MED ORDER — PROCHLORPERAZINE EDISYLATE 5 MG/ML IJ SOLN: 5.0000 mg | INTRAMUSCULAR | Status: DC | PRN

## 2016-01-13 MED ORDER — SODIUM CHLORIDE 0.9 % IV SOLN: 250.0000 mL | INTRAVENOUS | Status: DC | PRN

## 2016-01-13 MED ORDER — LOPERAMIDE HCL 2 MG PO CAPS: 2.0000 mg | ORAL_CAPSULE | Freq: Three times a day (TID) | ORAL | Status: DC | PRN

## 2016-01-13 MED ORDER — ASPIRIN EC 81 MG PO TBEC
81.0000 mg | DELAYED_RELEASE_TABLET | Freq: Every day | ORAL | Status: DC
  Administered 2016-01-13 – 2016-01-15 (×3): 81 mg via ORAL
  Filled 2016-01-13 (×3): qty 1

## 2016-01-13 MED ORDER — FLUCONAZOLE 200 MG PO TABS
200.0000 mg | ORAL_TABLET | Freq: Every day | ORAL | Status: DC
  Administered 2016-01-13 – 2016-01-15 (×3): 200 mg via ORAL
  Filled 2016-01-13 (×4): qty 1

## 2016-01-13 MED ORDER — ACETAMINOPHEN 325 MG PO TABS: 325.0000 mg | ORAL_TABLET | Freq: Four times a day (QID) | ORAL | Status: DC | PRN

## 2016-01-13 MED ORDER — SODIUM CHLORIDE 0.9 % IV SOLN
8.0000 mg | Freq: Four times a day (QID) | INTRAVENOUS | Status: DC | PRN
  Filled 2016-01-13: qty 4

## 2016-01-13 MED ORDER — ALUM & MAG HYDROXIDE-SIMETH 200-200-20 MG/5ML PO SUSP: 30.0000 mL | Freq: Four times a day (QID) | ORAL | Status: DC | PRN

## 2016-01-13 MED ORDER — ONDANSETRON HCL 4 MG/2ML IJ SOLN
4.0000 mg | Freq: Four times a day (QID) | INTRAMUSCULAR | Status: DC | PRN
Start: 2016-01-13 — End: 2016-01-21
  Administered 2016-01-16 – 2016-01-20 (×8): 4 mg via INTRAVENOUS
  Filled 2016-01-13 (×8): qty 2

## 2016-01-13 MED ORDER — LACTATED RINGERS IV BOLUS (SEPSIS): 1000.0000 mL | Freq: Three times a day (TID) | INTRAVENOUS | Status: AC | PRN

## 2016-01-13 MED ORDER — SACCHAROMYCES BOULARDII 250 MG PO CAPS
250.0000 mg | ORAL_CAPSULE | Freq: Two times a day (BID) | ORAL | Status: DC
  Administered 2016-01-13 – 2016-01-15 (×6): 250 mg via ORAL
  Filled 2016-01-13 (×7): qty 1

## 2016-01-13 MED ORDER — ERTAPENEM SODIUM 1 G IJ SOLR
1.0000 g | INTRAMUSCULAR | Status: AC
  Administered 2016-01-13 – 2016-01-20 (×8): 1 g via INTRAVENOUS
  Filled 2016-01-13 (×9): qty 1

## 2016-01-13 MED ORDER — PHENOL 1.4 % MT LIQD
2.0000 | OROMUCOSAL | Status: DC | PRN
  Filled 2016-01-13: qty 177

## 2016-01-13 MED ORDER — MENTHOL 3 MG MT LOZG: 1.0000 | LOZENGE | OROMUCOSAL | Status: DC | PRN

## 2016-01-13 MED ORDER — METOCLOPRAMIDE HCL 5 MG/ML IJ SOLN
5.0000 mg | Freq: Four times a day (QID) | INTRAMUSCULAR | Status: DC | PRN
  Administered 2016-01-17: 10 mg via INTRAVENOUS
  Filled 2016-01-13: qty 2

## 2016-01-13 MED ORDER — MAGIC MOUTHWASH
15.0000 mL | Freq: Four times a day (QID) | ORAL | Status: DC | PRN
  Filled 2016-01-13: qty 15

## 2016-01-13 NOTE — Progress Notes (Signed)
[address] [place], [place] 999-26-5244 Phone: [phone] FAX: [place] [id number] 1950-06-06  CARE TEAM:  PCP: [name]., DO  Outpatient Care Team: Patient Care Team: [name], DO as PCP - General (Family Medicine)  Inpatient Treatment Team: Treatment Team: Attending Provider: Nolon Nations, MD; Technician: Tenna Child, [place]; Technician: Sueanne Margarita, NT; Registered Nurse: Celedonio Savage, RN; Registered Nurse: Bailey Mech, RN; Registered Nurse: Danie Chandler, RN; Registered Nurse: Sonda Rumble, RN; Technician: Abbe Amsterdam, NT; Technician: Etheleen Sia, NT; Registered Nurse: Mertha Baars, RN  Problem List:   Principal Problem:   Acute appendicitis with perforation and peritoneal abscess Active Problems:   Essential hypertension   Esophageal reflux           Assessment  Improving clinically  Plan:  -adv fulls -continue IV ABX - switch to ertapenem with ?rash/flushing w Zosyn.  Add fluconozaole with inc WBC -CT scan 11/25 to see if collections improved -if worse of collections worse, consider lap/open washout w possible appendectomy +/- ileocecal resction +/- ilestomy.   -if better & CT better, switch to PO ABx w close outpt f/u to consider elective appy in ~ 6weeks once inflammation/appendicits resolved. -bowel regimen -control HTN -VTE prophylaxis- SCDs, etc -mobilize as tolerated to help recovery  Adin Hector, M.D., F.A.C.S. Gastrointestinal and Minimally Invasive Surgery [facility] [address] [place], [place] [address] [phone] Main / Paging   01/13/2016  Subjective:  -c/o rash on back "I think it's a heat rash" Flushing w Zosyn tol liquids Loose BMs No abd pain Walking in hallways  Objective:  Vital signs:  Vitals:   01/12/16 1000 01/12/16 1523 01/12/16 2143 01/13/16 0559  BP:  (!) 144/81 (!) 141/85 (!) 145/84 132/73  Pulse:  87 84 81  Resp:  18 16 16   Temp:  99.1 F (37.3 C) 99.4 F (37.4 C) 98.7 F (37.1 C)  TempSrc:  Oral Oral Oral  SpO2:  100% 97% 99%  Weight:      Height:        Last BM Date: 01/13/16  Intake/Output   Yesterday:  11/22 0701 - 11/23 0700 In: 3210 [P.O.:560; I.V.:2500; IV Piggyback:150] Out: [id number] [Urine:3700] This shift:  Total I/O In: 60 [P.O.:60] Out: 800 [Urine:800]  Bowel function:  Flatus: YES  BM:  YES  Drain: (No drain)   Physical Exam:  General: Pt awake/alert/oriented x4 in No acute distress Eyes: PERRL, normal EOM.  Sclera clear.  No icterus Neuro: CN II-XII intact w/o focal sensory/motor deficits. Lymph: No head/neck/groin lymphadenopathy Psych:  No delerium/psychosis/paranoia HENT: Normocephalic, Mucus membranes moist.  No thrush Neck: Supple, No tracheal deviation Chest: No chest wall pain w good excursion CV:  Pulses intact.  Regular rhythm MS: Normal AROM mjr joints.  No obvious deformity Abdomen: Soft.  Nondistended.  Nontender.  No evidence of peritonitis.  No incarcerated hernias. Ext:  SCDs BLE.  No mjr edema.  No cyanosis Skin: No petechiae / purpura  Results:   Labs: Results for orders placed or performed during the hospital encounter of 01/05/16 (from the past 48 hour(s))  CBC     Status: Abnormal   Collection Time: 01/13/16  5:18 AM  Result Value Ref Range   WBC 14.4 (H) 4.0 - 10.5 K/uL   RBC 3.47 (L) 3.87 - 5.11 MIL/uL   Hemoglobin 10.3 (L) 12.0 -  15.0 g/dL   HCT 29.9 (L) 36.0 - 46.0 %   MCV 86.2 78.0 - 100.0 fL   MCH 29.7 26.0 - 34.0 pg   MCHC 34.4 30.0 - 36.0 g/dL   RDW 14.1 11.5 - 15.5 %   Platelets 388 150 - 400 K/uL    Imaging / Studies: No results found.  Medications / Allergies: per chart  Antibiotics: Anti-infectives    Start     Dose/Rate Route Frequency Ordered Stop   01/13/16 1100  ertapenem (INVANZ) 1 g in sodium chloride 0.9 % 50 mL IVPB     1 g 100 mL/hr  over 30 Minutes Intravenous Every 24 hours 01/13/16 1058 01/23/16 1059   01/13/16 1100  fluconazole (DIFLUCAN) tablet 200 mg     200 mg Oral Daily 01/13/16 1058 01/18/16 0959   01/08/16 1000  piperacillin-tazobactam (ZOSYN) IVPB 3.375 g  Status:  Discontinued     3.375 g 12.5 mL/hr over 240 Minutes Intravenous Every 8 hours 01/08/16 0808 01/13/16 1058   01/06/16 0015  cefTRIAXone (ROCEPHIN) 2 g in dextrose 5 % 50 mL IVPB  Status:  Discontinued     2 g 100 mL/hr over 30 Minutes Intravenous Daily at bedtime 01/05/16 2358 01/08/16 0756   01/05/16 2358  metroNIDAZOLE (FLAGYL) IVPB 500 mg  Status:  Discontinued     500 mg 100 mL/hr over 60 Minutes Intravenous Every 8 hours 01/05/16 2358 01/08/16 0756   01/05/16 2030  piperacillin-tazobactam (ZOSYN) IVPB 3.375 g     3.375 g 100 mL/hr over 30 Minutes Intravenous  Once 01/05/16 2021 01/05/16 2157        Note: Portions of this report may have been transcribed using voice recognition software. Every effort was made to ensure accuracy; however, inadvertent computerized transcription errors may be present.   Any transcriptional errors that result from this process are unintentional.     Adin Hector, M.D., F.A.C.S. Gastrointestinal and Minimally Invasive Surgery [facility] [address] [place], [place] [address] [phone] Main / Paging   01/13/2016

## 2016-01-14 DIAGNOSIS — E876 Hypokalemia: Secondary | ICD-10-CM

## 2016-01-14 LAB — CBC
HCT: 30.1 % — ABNORMAL LOW (ref 36.0–46.0)
Hemoglobin: 10.2 g/dL — ABNORMAL LOW (ref 12.0–15.0)
MCH: 29.3 pg (ref 26.0–34.0)
MCHC: 33.9 g/dL (ref 30.0–36.0)
MCV: 86.5 fL (ref 78.0–100.0)
PLATELETS: 428 10*3/uL — AB (ref 150–400)
RBC: 3.48 MIL/uL — AB (ref 3.87–5.11)
RDW: 14.1 % (ref 11.5–15.5)
WBC: 14.2 10*3/uL — AB (ref 4.0–10.5)

## 2016-01-14 LAB — BASIC METABOLIC PANEL
ANION GAP: 7 (ref 5–15)
CO2: 23 mmol/L (ref 22–32)
CREATININE: 0.78 mg/dL (ref 0.44–1.00)
Calcium: 8.3 mg/dL — ABNORMAL LOW (ref 8.9–10.3)
Chloride: 107 mmol/L (ref 101–111)
GFR calc non Af Amer: >60 mL/min (ref >60)
GLUCOSE: 101 mg/dL — AB (ref 65–99)
POTASSIUM: 3.2 mmol/L — AB (ref 3.5–5.1)
SODIUM: 137 mmol/L (ref 135–145)

## 2016-01-14 LAB — MAGNESIUM: Magnesium: 1.8 mg/dL (ref 1.7–2.4)

## 2016-01-14 MED ORDER — MAGNESIUM SULFATE 2 GM/50ML IV SOLN
2.0000 g | Freq: Once | INTRAVENOUS | Status: AC
  Administered 2016-01-14: 2 g via INTRAVENOUS
  Filled 2016-01-14: qty 50

## 2016-01-14 MED ORDER — POTASSIUM CHLORIDE CRYS ER 20 MEQ PO TBCR
40.0000 meq | EXTENDED_RELEASE_TABLET | Freq: Two times a day (BID) | ORAL | Status: DC
  Administered 2016-01-14 – 2016-01-15 (×4): 40 meq via ORAL
  Filled 2016-01-14 (×4): qty 2

## 2016-01-14 NOTE — Progress Notes (Addendum)
[address] [place], [place] [address] Phone: [phone] FAX: [place] [phone] 08/18/1950  CARE TEAM:  PCP: [name]., DO  Outpatient Care Team: Patient Care Team: [name], DO as PCP - General (Family Medicine)  Inpatient Treatment Team: Treatment Team: Attending Provider: Nolon Nations, MD; Technician: Tenna Child, [place]; Technician: Sueanne Margarita, NT; Registered Nurse: Celedonio Savage, RN; Registered Nurse: Bailey Mech, RN; Registered Nurse: Danie Chandler, RN; Registered Nurse: Sonda Rumble, RN; Technician: Abbe Amsterdam, NT; Technician: Etheleen Sia, NT; Registered Nurse: Mertha Baars, RN; Registered Nurse: Joselyn Glassman, RN  Problem List:   Principal Problem:   Acute appendicitis with perforation and peritoneal abscess Active Problems:   Essential hypertension   Esophageal reflux           Assessment  Improving clinically  Plan:  -adv to soft diet -continue IV ABX -  ertapenem /  fluconazole   -CT scan tomorrow 11/25 to see if collections improved: -if clinically worse or collections worse: consider lap/open washout w possible appendectomy +/- ileocecal resction +/- ileostomy.   -if clinically better & CT better: switch to PO ABx w close outpt f/u to consider elective appy in ~ 6 weeks once inflammation/appendicits resolved.  -bowel regimen -control HTN -low K - replace & check Mag -GERD control -VTE prophylaxis- SCDs, etc -mobilize as tolerated to help recovery  Dana Hodge, M.D., F.A.C.S. Gastrointestinal and Minimally Invasive Surgery [facility] [address], [place], [place] [address] [phone] Main / Paging   01/14/2016  Subjective:  Tolerating full liquids. No abd pain Walking in hallways  Objective:  Vital signs:  Vitals:   01/13/16 1800 01/13/16 2125 01/14/16 0500  01/14/16 0942  BP: (!) 148/83 (!) 145/76  (!) 147/85  Pulse:  80 87   Resp:  18 18   Temp: 99.9 F (37.7 C) 99 F (37.2 C) 99.6 F (37.6 C)   TempSrc:  Oral Oral   SpO2:  100% 97%   Weight:      Height:        Last BM Date: 03/16/15  Intake/Output   Yesterday:  11/23 0701 - 11/24 0700 In: 2968 [P.O.:2068; I.V.:700; IV Piggyback:200] Out: 8338 [SNKNL:9767; Stool:1] This shift:  No intake/output data recorded.  Bowel function:  Flatus: YES  BM:  YES  Drain: (No drain)   Physical Exam:  General: Pt awake/alert/oriented x4 in No acute distress Eyes: PERRL, normal EOM.  Sclera clear.  No icterus Neuro: CN II-XII intact w/o focal sensory/motor deficits. Lymph: No head/neck/groin lymphadenopathy Psych:  No delerium/psychosis/paranoia HENT: Normocephalic, Mucus membranes moist.  No thrush Neck: Supple, No tracheal deviation Chest: No chest wall pain w good excursion CV:  Pulses intact.  Regular rhythm MS: Normal AROM mjr joints.  No obvious deformity Abdomen: Soft.  Nondistended.  Nontender.  No evidence of peritonitis.  No incarcerated hernias. Ext:  SCDs BLE.  No mjr edema.  No cyanosis Skin: No petechiae / purpura  Results:   Labs: Results for orders placed or performed during the hospital encounter of 01/05/16 (from the past 48 hour(s))  CBC     Status: Abnormal   Collection Time: 01/13/16  5:18 AM  Result Value Ref Range   WBC 14.4 (H) 4.0 - 10.5 K/uL   RBC 3.47 (L) 3.87 - 5.11 MIL/uL   Hemoglobin 10.3 (L) 12.0 -  15.0 g/dL   HCT 29.9 (L) 36.0 - 46.0 %   MCV 86.2 78.0 - 100.0 fL   MCH 29.7 26.0 - 34.0 pg   MCHC 34.4 30.0 - 36.0 g/dL   RDW 14.1 11.5 - 15.5 %   Platelets 388 150 - 400 K/uL  CBC     Status: Abnormal   Collection Time: 01/14/16  4:50 AM  Result Value Ref Range   WBC 14.2 (H) 4.0 - 10.5 K/uL   RBC 3.48 (L) 3.87 - 5.11 MIL/uL   Hemoglobin 10.2 (L) 12.0 - 15.0 g/dL   HCT 30.1 (L) 36.0 - 46.0 %   MCV 86.5 78.0 - 100.0 fL   MCH 29.3 26.0 -  34.0 pg   MCHC 33.9 30.0 - 36.0 g/dL   RDW 14.1 11.5 - 15.5 %   Platelets 428 (H) 150 - 400 K/uL  Basic metabolic panel     Status: Abnormal   Collection Time: 01/14/16  4:50 AM  Result Value Ref Range   Sodium 137 135 - 145 mmol/L   Potassium 3.2 (L) 3.5 - 5.1 mmol/L   Chloride 107 101 - 111 mmol/L   CO2 23 22 - 32 mmol/L   Glucose, Bld 101 (H) 65 - 99 mg/dL   BUN <5 (L) 6 - 20 mg/dL   Creatinine, Ser 0.78 0.44 - 1.00 mg/dL   Calcium 8.3 (L) 8.9 - 10.3 mg/dL   GFR calc non Af Amer >60 >60 mL/min   GFR calc Af Amer >60 >60 mL/min    Comment: (NOTE) The eGFR has been calculated using the CKD EPI equation. This calculation has not been validated in all clinical situations. eGFR's persistently <60 mL/min signify possible Chronic Kidney Disease.    Anion gap 7 5 - 15    Imaging / Studies: No results found.  Medications / Allergies: per chart  Antibiotics: Anti-infectives    Start     Dose/Rate Route Frequency Ordered Stop   01/13/16 1600  ertapenem (INVANZ) 1 g in sodium chloride 0.9 % 50 mL IVPB     1 g 100 mL/hr over 30 Minutes Intravenous Every 24 hours 01/13/16 1058 01/23/16 1559   01/13/16 1200  fluconazole (DIFLUCAN) tablet 200 mg     200 mg Oral Daily 01/13/16 1058 01/18/16 0959   01/08/16 1000  piperacillin-tazobactam (ZOSYN) IVPB 3.375 g  Status:  Discontinued     3.375 g 12.5 mL/hr over 240 Minutes Intravenous Every 8 hours 01/08/16 0808 01/13/16 1058   01/06/16 0015  cefTRIAXone (ROCEPHIN) 2 g in dextrose 5 % 50 mL IVPB  Status:  Discontinued     2 g 100 mL/hr over 30 Minutes Intravenous Daily at bedtime 01/05/16 2358 01/08/16 0756   01/05/16 2358  metroNIDAZOLE (FLAGYL) IVPB 500 mg  Status:  Discontinued     500 mg 100 mL/hr over 60 Minutes Intravenous Every 8 hours 01/05/16 2358 01/08/16 0756   01/05/16 2030  piperacillin-tazobactam (ZOSYN) IVPB 3.375 g     3.375 g 100 mL/hr over 30 Minutes Intravenous  Once 01/05/16 2021 01/05/16 2157         Note: Portions of this report may have been transcribed using voice recognition software. Every effort was made to ensure accuracy; however, inadvertent computerized transcription errors may be present.   Any transcriptional errors that result from this process are unintentional.     Dana Hodge, M.D., F.A.C.S. Gastrointestinal and Minimally Invasive Surgery [facility] [address], [place],  [place] [address] [phone] Main / Paging   01/14/2016

## 2016-01-15 ENCOUNTER — Encounter (HOSPITAL_COMMUNITY): Payer: Self-pay | Admitting: Radiology

## 2016-01-15 ENCOUNTER — Encounter (HOSPITAL_COMMUNITY): Admission: EM | Disposition: A | Discharge status: Home / Self Care | Payer: Self-pay | Source: Home / Self Care

## 2016-01-15 ENCOUNTER — Inpatient Hospital Stay (HOSPITAL_COMMUNITY): Payer: 59 | Admitting: Anesthesiology

## 2016-01-15 ENCOUNTER — Inpatient Hospital Stay (HOSPITAL_COMMUNITY): Payer: 59

## 2016-01-15 DIAGNOSIS — IMO0002 Reserved for concepts with insufficient information to code with codable children: Secondary | ICD-10-CM

## 2016-01-15 HISTORY — PX: LAPAROSCOPIC APPENDECTOMY: SHX408

## 2016-01-15 LAB — CBC
HCT: 30.9 % — ABNORMAL LOW (ref 36.0–46.0)
Hemoglobin: 10.5 g/dL — ABNORMAL LOW (ref 12.0–15.0)
MCH: 29.3 pg (ref 26.0–34.0)
MCHC: 34 g/dL (ref 30.0–36.0)
MCV: 86.3 fL (ref 78.0–100.0)
PLATELETS: 460 10*3/uL — AB (ref 150–400)
RBC: 3.58 MIL/uL — AB (ref 3.87–5.11)
RDW: 14.1 % (ref 11.5–15.5)
WBC: 15.9 10*3/uL — ABNORMAL HIGH (ref 4.0–10.5)

## 2016-01-15 LAB — MRSA PCR SCREENING: MRSA by PCR: NEGATIVE

## 2016-01-15 SURGERY — APPENDECTOMY, LAPAROSCOPIC
Anesthesia: General | Site: Abdomen

## 2016-01-15 MED ORDER — IOPAMIDOL (ISOVUE-300) INJECTION 61%
INTRAVENOUS | Status: AC
  Filled 2016-01-15: qty 30

## 2016-01-15 MED ORDER — DEXAMETHASONE SODIUM PHOSPHATE 10 MG/ML IJ SOLN
INTRAMUSCULAR | Status: DC | PRN
  Administered 2016-01-15: 10 mg via INTRAVENOUS

## 2016-01-15 MED ORDER — 0.9 % SODIUM CHLORIDE (POUR BTL) OPTIME
TOPICAL | Status: DC | PRN
  Administered 2016-01-15: 1000 mL

## 2016-01-15 MED ORDER — SUGAMMADEX SODIUM 200 MG/2ML IV SOLN
INTRAVENOUS | Status: DC | PRN
  Administered 2016-01-15: 150 mg via INTRAVENOUS

## 2016-01-15 MED ORDER — BUPIVACAINE LIPOSOME 1.3 % IJ SUSP
20.0000 mL | Freq: Once | INTRAMUSCULAR | Status: DC
  Filled 2016-01-15: qty 20

## 2016-01-15 MED ORDER — IOPAMIDOL (ISOVUE-300) INJECTION 61%
100.0000 mL | Freq: Once | INTRAVENOUS | Status: AC | PRN
Start: 2016-01-15 — End: 2016-01-15
  Administered 2016-01-15: 100 mL via INTRAVENOUS

## 2016-01-15 MED ORDER — PROPOFOL 10 MG/ML IV BOLUS
INTRAVENOUS | Status: AC
  Filled 2016-01-15: qty 20

## 2016-01-15 MED ORDER — SODIUM CHLORIDE 0.9 % IJ SOLN
INTRAMUSCULAR | Status: AC
  Filled 2016-01-15: qty 50

## 2016-01-15 MED ORDER — LACTATED RINGERS IV SOLN
INTRAVENOUS | Status: DC | PRN
  Administered 2016-01-15 (×2): via INTRAVENOUS

## 2016-01-15 MED ORDER — ENOXAPARIN SODIUM 40 MG/0.4ML ~~LOC~~ SOLN
40.0000 mg | Freq: Every day | SUBCUTANEOUS | Status: DC
  Administered 2016-01-16 – 2016-01-19 (×4): 40 mg via SUBCUTANEOUS
  Filled 2016-01-15 (×5): qty 0.4

## 2016-01-15 MED ORDER — SUCCINYLCHOLINE CHLORIDE 200 MG/10ML IV SOSY
PREFILLED_SYRINGE | INTRAVENOUS | Status: DC | PRN
  Administered 2016-01-15: 100 mg via INTRAVENOUS

## 2016-01-15 MED ORDER — METHOCARBAMOL 1000 MG/10ML IJ SOLN
1000.0000 mg | Freq: Four times a day (QID) | INTRAVENOUS | Status: DC | PRN
  Filled 2016-01-15: qty 10

## 2016-01-15 MED ORDER — MORPHINE SULFATE (PF) 2 MG/ML IV SOLN
2.0000 mg | INTRAVENOUS | Status: DC | PRN
  Administered 2016-01-15 (×2): 2 mg via INTRAVENOUS
  Filled 2016-01-15 (×2): qty 1

## 2016-01-15 MED ORDER — SUCCINYLCHOLINE CHLORIDE 200 MG/10ML IV SOSY
PREFILLED_SYRINGE | INTRAVENOUS | Status: AC
  Filled 2016-01-15: qty 10

## 2016-01-15 MED ORDER — BUPIVACAINE HCL 0.5 % IJ SOLN
INTRAMUSCULAR | Status: DC | PRN
  Administered 2016-01-15: 30 mL

## 2016-01-15 MED ORDER — LIDOCAINE 2% (20 MG/ML) 5 ML SYRINGE
INTRAMUSCULAR | Status: DC | PRN
  Administered 2016-01-15: 100 mg via INTRAVENOUS

## 2016-01-15 MED ORDER — ONDANSETRON HCL 4 MG/2ML IJ SOLN
INTRAMUSCULAR | Status: DC | PRN
  Administered 2016-01-15: 4 mg via INTRAVENOUS

## 2016-01-15 MED ORDER — PROMETHAZINE HCL 25 MG/ML IJ SOLN: 6.2500 mg | INTRAMUSCULAR | Status: DC | PRN

## 2016-01-15 MED ORDER — BUPIVACAINE HCL (PF) 0.5 % IJ SOLN
INTRAMUSCULAR | Status: AC
  Filled 2016-01-15: qty 30

## 2016-01-15 MED ORDER — ONDANSETRON HCL 4 MG/2ML IJ SOLN
INTRAMUSCULAR | Status: AC
  Filled 2016-01-15: qty 2

## 2016-01-15 MED ORDER — ROCURONIUM BROMIDE 50 MG/5ML IV SOSY
PREFILLED_SYRINGE | INTRAVENOUS | Status: AC
  Filled 2016-01-15: qty 5

## 2016-01-15 MED ORDER — HYDROMORPHONE HCL 1 MG/ML IJ SOLN
INTRAMUSCULAR | Status: AC
  Filled 2016-01-15: qty 1

## 2016-01-15 MED ORDER — HYDROMORPHONE HCL 1 MG/ML IJ SOLN
0.2500 mg | INTRAMUSCULAR | Status: DC | PRN
  Administered 2016-01-15: 0.25 mg via INTRAVENOUS

## 2016-01-15 MED ORDER — GENTAMICIN SULFATE 40 MG/ML IJ SOLN
INTRAMUSCULAR | Status: DC | PRN
  Administered 2016-01-15: 1000 mL via INTRAPERITONEAL

## 2016-01-15 MED ORDER — SCOPOLAMINE 1 MG/3DAYS TD PT72
MEDICATED_PATCH | TRANSDERMAL | Status: DC | PRN
  Administered 2016-01-15: 1 via TRANSDERMAL

## 2016-01-15 MED ORDER — CELECOXIB 200 MG PO CAPS
400.0000 mg | ORAL_CAPSULE | ORAL | Status: AC
  Administered 2016-01-15: 400 mg via ORAL
  Filled 2016-01-15: qty 2

## 2016-01-15 MED ORDER — ACETAMINOPHEN 500 MG PO TABS
1000.0000 mg | ORAL_TABLET | ORAL | Status: AC
  Administered 2016-01-15: 1000 mg via ORAL
  Filled 2016-01-15: qty 2

## 2016-01-15 MED ORDER — LIDOCAINE 2% (20 MG/ML) 5 ML SYRINGE
INTRAMUSCULAR | Status: AC
  Filled 2016-01-15: qty 5

## 2016-01-15 MED ORDER — FENTANYL CITRATE (PF) 250 MCG/5ML IJ SOLN
INTRAMUSCULAR | Status: AC
  Filled 2016-01-15: qty 5

## 2016-01-15 MED ORDER — LACTATED RINGERS IR SOLN
Status: DC | PRN
  Administered 2016-01-15 (×3): 3000 mL

## 2016-01-15 MED ORDER — SODIUM CHLORIDE 0.9 % IV SOLN
INTRAVENOUS | Status: DC
  Filled 2016-01-15: qty 6

## 2016-01-15 MED ORDER — PROPOFOL 10 MG/ML IV BOLUS
INTRAVENOUS | Status: DC | PRN
  Administered 2016-01-15: 110 mg via INTRAVENOUS

## 2016-01-15 MED ORDER — IOPAMIDOL (ISOVUE-300) INJECTION 61%
INTRAVENOUS | Status: AC
  Filled 2016-01-15: qty 100

## 2016-01-15 MED ORDER — FENTANYL CITRATE (PF) 100 MCG/2ML IJ SOLN
INTRAMUSCULAR | Status: DC | PRN
  Administered 2016-01-15 (×4): 50 ug via INTRAVENOUS
  Administered 2016-01-15: 100 ug via INTRAVENOUS
  Administered 2016-01-15: 50 ug via INTRAVENOUS

## 2016-01-15 MED ORDER — KETOROLAC TROMETHAMINE 15 MG/ML IJ SOLN
15.0000 mg | Freq: Four times a day (QID) | INTRAMUSCULAR | Status: DC
  Administered 2016-01-16 – 2016-01-17 (×7): 15 mg via INTRAVENOUS
  Filled 2016-01-15 (×7): qty 1

## 2016-01-15 MED ORDER — SCOPOLAMINE 1 MG/3DAYS TD PT72
MEDICATED_PATCH | TRANSDERMAL | Status: AC
  Filled 2016-01-15: qty 1

## 2016-01-15 MED ORDER — DEXAMETHASONE SODIUM PHOSPHATE 10 MG/ML IJ SOLN
INTRAMUSCULAR | Status: AC
  Filled 2016-01-15: qty 1

## 2016-01-15 MED ORDER — GABAPENTIN 300 MG PO CAPS
300.0000 mg | ORAL_CAPSULE | ORAL | Status: AC
  Administered 2016-01-15: 300 mg via ORAL
  Filled 2016-01-15: qty 1

## 2016-01-15 MED ORDER — KETOROLAC TROMETHAMINE 30 MG/ML IJ SOLN: 30.0000 mg | Freq: Once | INTRAMUSCULAR | Status: DC | PRN

## 2016-01-15 MED ORDER — IOPAMIDOL (ISOVUE-300) INJECTION 61%
30.0000 mL | Freq: Once | INTRAVENOUS | Status: AC | PRN
  Administered 2016-01-15: 30 mL via ORAL

## 2016-01-15 MED ORDER — ROCURONIUM BROMIDE 10 MG/ML (PF) SYRINGE
PREFILLED_SYRINGE | INTRAVENOUS | Status: DC | PRN
  Administered 2016-01-15: 10 mg via INTRAVENOUS
  Administered 2016-01-15: 30 mg via INTRAVENOUS
  Administered 2016-01-15 (×2): 10 mg via INTRAVENOUS

## 2016-01-15 MED ORDER — FENTANYL CITRATE (PF) 100 MCG/2ML IJ SOLN
INTRAMUSCULAR | Status: AC
  Filled 2016-01-15: qty 2

## 2016-01-15 SURGICAL SUPPLY — 48 items
APPLIER CLIP 5 13 M/L LIGAMAX5 (MISCELLANEOUS)
APPLIER CLIP ROT 10 11.4 M/L (STAPLE)
CABLE HIGH FREQUENCY MONO STRZ (ELECTRODE) ×3 IMPLANT
CHLORAPREP W/TINT 26ML (MISCELLANEOUS) ×3 IMPLANT
CLIP APPLIE 5 13 M/L LIGAMAX5 (MISCELLANEOUS) IMPLANT
CLIP APPLIE ROT 10 11.4 M/L (STAPLE) IMPLANT
COVER SURGICAL LIGHT HANDLE (MISCELLANEOUS) ×3 IMPLANT
CUTTER FLEX LINEAR 45M (STAPLE) IMPLANT
DECANTER SPIKE VIAL GLASS SM (MISCELLANEOUS) ×3 IMPLANT
DEVICE TROCAR PUNCTURE CLOSURE (ENDOMECHANICALS) ×3 IMPLANT
DRAIN CHANNEL 19F RND (DRAIN) ×3 IMPLANT
DRAPE LAPAROSCOPIC ABDOMINAL (DRAPES) IMPLANT
DRAPE WARM FLUID 44X44 (DRAPE) ×3 IMPLANT
DRSG TEGADERM 2-3/8X2-3/4 SM (GAUZE/BANDAGES/DRESSINGS) ×3 IMPLANT
DRSG TEGADERM 4X4.75 (GAUZE/BANDAGES/DRESSINGS) ×6 IMPLANT
ELECT REM PT RETURN 9FT ADLT (ELECTROSURGICAL) ×3
ELECTRODE REM PT RTRN 9FT ADLT (ELECTROSURGICAL) ×1 IMPLANT
ENDOLOOP SUT PDS II  0 18 (SUTURE)
ENDOLOOP SUT PDS II 0 18 (SUTURE) IMPLANT
EVACUATOR SILICONE 100CC (DRAIN) ×3 IMPLANT
GAUZE SPONGE 2X2 8PLY STRL LF (GAUZE/BANDAGES/DRESSINGS) ×1 IMPLANT
GLOVE ECLIPSE 8.0 STRL XLNG CF (GLOVE) IMPLANT
GLOVE INDICATOR 8.0 STRL GRN (GLOVE) IMPLANT
GOWN STRL REUS W/TWL XL LVL3 (GOWN DISPOSABLE) ×6 IMPLANT
IRRIG SUCT STRYKERFLOW 2 WTIP (MISCELLANEOUS) ×3
IRRIGATION SUCT STRKRFLW 2 WTP (MISCELLANEOUS) ×1 IMPLANT
KIT BASIN OR (CUSTOM PROCEDURE TRAY) ×3 IMPLANT
PAD POSITIONING PINK XL (MISCELLANEOUS) ×3 IMPLANT
POUCH SPECIMEN RETRIEVAL 10MM (ENDOMECHANICALS) ×6 IMPLANT
RELOAD 45 VASCULAR/THIN (ENDOMECHANICALS) IMPLANT
RELOAD STAPLE TA45 3.5 REG BLU (ENDOMECHANICALS) IMPLANT
RELOAD STAPLER BLUE 60MM (STAPLE) ×1 IMPLANT
SCISSORS LAP 5X35 DISP (ENDOMECHANICALS) ×3 IMPLANT
SHEARS HARMONIC ACE PLUS 36CM (ENDOMECHANICALS) ×3 IMPLANT
SLEEVE XCEL OPT CAN 5 100 (ENDOMECHANICALS) ×3 IMPLANT
SPONGE GAUZE 2X2 STER 10/PKG (GAUZE/BANDAGES/DRESSINGS) ×2
STAPLE ECHEON FLEX 60 POW ENDO (STAPLE) ×3 IMPLANT
STAPLER RELOAD BLUE 60MM (STAPLE) ×3
SUT MNCRL AB 4-0 PS2 18 (SUTURE) ×3 IMPLANT
SUT PDS AB 0 CT1 36 (SUTURE) IMPLANT
SUT PDS AB 1 CT1 27 (SUTURE) IMPLANT
SUT PROLENE 2 0 SH DA (SUTURE) ×3 IMPLANT
SUT SILK 2 0 SH (SUTURE) IMPLANT
TOWEL OR 17X26 10 PK STRL BLUE (TOWEL DISPOSABLE) ×3 IMPLANT
TRAY LAPAROSCOPIC (CUSTOM PROCEDURE TRAY) ×3 IMPLANT
TROCAR BLADELESS OPT 5 100 (ENDOMECHANICALS) ×3 IMPLANT
TROCAR XCEL 12X100 BLDLESS (ENDOMECHANICALS) ×6 IMPLANT
TUBING INSUF HEATED (TUBING) ×3 IMPLANT

## 2016-01-15 NOTE — Progress Notes (Signed)
[address] [place], [place] [address] Phone: [phone] FAX: [place] [phone] Jun 05, 1950  CARE TEAM:  PCP: [name]., DO  Outpatient Care Team: Patient Care Team: [name], DO as PCP - General (Family Medicine)  Inpatient Treatment Team: Treatment Team: Attending Provider: Nolon Nations, MD; Technician: Tenna Child, [place]; Technician: Sueanne Margarita, NT; Registered Nurse: Celedonio Savage, RN; Registered Nurse: Bailey Mech, RN; Technician: Abbe Amsterdam, NT; Technician: Etheleen Sia, NT; Registered Nurse: Mertha Baars, RN; Registered Nurse: Joselyn Glassman, RN  Problem List:   Principal Problem:   Acute appendicitis with perforation and peritoneal abscess Active Problems:   Hyperlipemia   Essential hypertension   Esophageal reflux   Hypokalemia           Assessment  Increasing size of interloop intra-abdominal abscess, and worsening leukocytosis, some nausea and abdominal pain.  Plan:  Abdominal exploration with washout of abscess and possible appendectomy.  Work laparoscopically.  Discussed at length with the patient and her friend.  Discussed at length with her husband on the phone.  Fortunate she is failed nonoperative management with two different regimens of antibiotics:  The anatomy & physiology of the digestive tract was discussed.  The pathophysiology of perforation was discussed.  Differential diagnosis such as perforated ulcer or colon, etc was discussed.   Natural history risks without surgery such as death was discussed.  I recommended abdominal exploration to diagnose & treat the source of the problem.  Laparoscopic & open techniques were discussed.   Risks such as bleeding, infection, abscess, leak, reoperation, bowel resection, possible ostomy, injury to other organs, need for repair of tissues / organs, hernia, heart attack, death, and other risks  were discussed.   The risks of no intervention will lead to serious problems including death.   I expressed a good likelihood that surgery will address the problem.    Goals of post-operative recovery were discussed as well.  We will work to minimize complications although risks in an emergent setting are high.   Questions were answered.  The patient expressed understanding & wishes to proceed with surgery.         Adin Hector, M.D., F.A.C.S. Gastrointestinal and Minimally Invasive Surgery [facility] [address], [place], [place] [address] [phone] Main / Paging   01/15/2016  Subjective:  Was feeling better but worsening bloating and cramping after drinking contrast.  Friend at bedside.  Husband on phone.  Objective:  Vital signs:  Vitals:   01/14/16 0942 01/14/16 1400 01/14/16 2134 01/15/16 0515  BP: (!) 147/85  140/80 124/73  Pulse:  93 91 87  Resp:  _0 Temp:  98.4 F (36.9 C) 99.5 F (37.5 C) 98.8 F (37.1 C)  TempSrc:  Axillary Oral Oral  SpO2:  100% 100% 100%  Weight:      Height:        Last BM Date: 01/15/16  Intake/Output   Yesterday:  11/24 0701 - 11/25 0700 In: 3810 [P.O.:1600; IV Piggyback:150] Out: 3875 [Urine:3875] This shift:  Total I/O In: -  Out: 500 [Urine:500]  Bowel function:  Flatus: YES  BM:  No  Drain: (No drain)   Physical Exam:  General: Pt awake/alert/oriented x4 in No acute distress Eyes: PERRL, normal EOM.  Sclera clear.  No icterus Neuro: CN II-XII intact w/o focal sensory/motor deficits. Lymph:  No head/neck/groin lymphadenopathy Psych:  No delerium/psychosis/paranoia HENT: Normocephalic, Mucus membranes moist.  No thrush Neck: Supple, No tracheal deviation Chest: No chest wall pain w good excursion CV:  Pulses intact.  Regular rhythm MS: Normal AROM mjr joints.  No obvious deformity Abdomen: Soft.  Nondistended.  Tenderness at Right lower quadrant with some  voluntary guarding at deep palpation.  Less so with light palpation..  No evidence of peritonitis.  No incarcerated hernias. Ext:  SCDs BLE.  No mjr edema.  No cyanosis Skin: No petechiae / purpura  Results:   Labs: Results for orders placed or performed during the hospital encounter of 01/05/16 (from the past 48 hour(s))  CBC     Status: Abnormal   Collection Time: 01/14/16  4:50 AM  Result Value Ref Range   WBC 14.2 (H) 4.0 - 10.5 K/uL   RBC 3.48 (L) 3.87 - 5.11 MIL/uL   Hemoglobin 10.2 (L) 12.0 - 15.0 g/dL   HCT 30.1 (L) 36.0 - 46.0 %   MCV 86.5 78.0 - 100.0 fL   MCH 29.3 26.0 - 34.0 pg   MCHC 33.9 30.0 - 36.0 g/dL   RDW 14.1 11.5 - 15.5 %   Platelets 428 (H) 150 - 400 K/uL  Basic metabolic panel     Status: Abnormal   Collection Time: 01/14/16  4:50 AM  Result Value Ref Range   Sodium 137 135 - 145 mmol/L   Potassium 3.2 (L) 3.5 - 5.1 mmol/L   Chloride 107 101 - 111 mmol/L   CO2 23 22 - 32 mmol/L   Glucose, Bld 101 (H) 65 - 99 mg/dL   BUN <5 (L) 6 - 20 mg/dL   Creatinine, Ser 0.78 0.44 - 1.00 mg/dL   Calcium 8.3 (L) 8.9 - 10.3 mg/dL   GFR calc non Af Amer >60 >60 mL/min   GFR calc Af Amer >60 >60 mL/min    Comment: (NOTE) The eGFR has been calculated using the CKD EPI equation. This calculation has not been validated in all clinical situations. eGFR's persistently <60 mL/min signify possible Chronic Kidney Disease.    Anion gap 7 5 - 15  Magnesium     Status: None   Collection Time: 01/14/16  4:50 AM  Result Value Ref Range   Magnesium 1.8 1.7 - 2.4 mg/dL  CBC     Status: Abnormal   Collection Time: 01/15/16  5:11 AM  Result Value Ref Range   WBC 15.9 (H) 4.0 - 10.5 K/uL   RBC 3.58 (L) 3.87 - 5.11 MIL/uL   Hemoglobin 10.5 (L) 12.0 - 15.0 g/dL   HCT 30.9 (L) 36.0 - 46.0 %   MCV 86.3 78.0 - 100.0 fL   MCH 29.3 26.0 - 34.0 pg   MCHC 34.0 30.0 - 36.0 g/dL   RDW 14.1 11.5 - 15.5 %   Platelets 460 (H) 150 - 400 K/uL    Imaging / Studies: Ct Abdomen Pelvis W  Contrast  Result Date: 01/15/2016 CLINICAL DATA:  Re-evaluate periappendiceal abscess EXAM: CT ABDOMEN AND PELVIS WITH CONTRAST TECHNIQUE: Multidetector CT imaging of the abdomen and pelvis was performed using the standard protocol following bolus administration of intravenous contrast. CONTRAST:  24m ISOVUE-300 IOPAMIDOL (ISOVUE-300) INJECTION 61%, 1014mISOVUE-300 IOPAMIDOL (ISOVUE-300) INJECTION 61% COMPARISON:  01/10/2016 FINDINGS: Lower chest: Small bilateral pleural effusions are noted. Hepatobiliary: Cholelithiasis is again identified. No abnormality is noted within the liver. Pancreas: Unremarkable. No pancreatic ductal dilatation or surrounding inflammatory changes. The previously seen fluid collection adjacent to  the pancreas has resolved in the interval. Spleen: Normal in size without focal abnormality. Adrenals/Urinary Tract: Adrenal glands are unremarkable. Kidneys are normal, without renal calculi, focal lesion, or hydronephrosis. Bladder is unremarkable. Stomach/Bowel: Interval decrease in the degree of small bowel dilatation. Contrast material 0 passes into the colon without difficulty. An appendicolith is again noted with dilatation of the appendix similar to that seen on prior exam. Now represents only fluid and measures approximately 2.6 by 1.8 cm in greatest dimension. This is an overall decrease in size when compared with the prior study. There is a fluid collection identified within the mesentery however with enhancing borders consistent with an organizing abscess. This measures 5.3 x 3.7 cm and has increased in size from the prior exam at which time it measured just under 4 cm. A small amount of air is now noted within which was not present on the prior exam. A slightly smaller 1 cm fluid collection is noted on image number 59 of series 2 which has decreased in the interval from the prior exam. Vascular/Lymphatic: No significant vascular findings are present. No enlarged abdominal or  pelvic lymph nodes. Reproductive: Uterus and bilateral adnexa are unremarkable. Other: Some fluid remains in the pelvis with an enhancing border suggesting peritonitis. Musculoskeletal: No acute abnormality noted. IMPRESSION: The previously seen periappendiceal air-fluid collection now shows only fluid and has decreased in size somewhat. There has however been increase in size of the fluid collection within the mesenteric. It demonstrates enhancing borders and some small amount of air within. This fluid collection is not amenable to percutaneous drainage at this time. The appendix remains dilated with a central appendicolith. Other smaller fluid collections are noted which have improved in the interval from the prior exam. The fluid in the pelvis with enhancing borders is relatively stable. Resolution of previously seen small bowel dilatation which was likely reactive in nature. Cholelithiasis without complicating factors. Resolution of peripancreatic fluid collection. Electronically Signed   By: Inez Catalina M.D.   On: 01/15/2016 09:41    Medications / Allergies: per chart  Antibiotics: Anti-infectives    Start     Dose/Rate Route Frequency Ordered Stop   01/13/16 1600  ertapenem (INVANZ) 1 g in sodium chloride 0.9 % 50 mL IVPB     1 g 100 mL/hr over 30 Minutes Intravenous Every 24 hours 01/13/16 1058 01/23/16 1559   01/13/16 1200  fluconazole (DIFLUCAN) tablet 200 mg     200 mg Oral Daily 01/13/16 1058 01/18/16 0959   01/08/16 1000  piperacillin-tazobactam (ZOSYN) IVPB 3.375 g  Status:  Discontinued     3.375 g 12.5 mL/hr over 240 Minutes Intravenous Every 8 hours 01/08/16 0808 01/13/16 1058   01/06/16 0015  cefTRIAXone (ROCEPHIN) 2 g in dextrose 5 % 50 mL IVPB  Status:  Discontinued     2 g 100 mL/hr over 30 Minutes Intravenous Daily at bedtime 01/05/16 2358 01/08/16 0756   01/05/16 2358  metroNIDAZOLE (FLAGYL) IVPB 500 mg  Status:  Discontinued     500 mg 100 mL/hr over 60 Minutes  Intravenous Every 8 hours 01/05/16 2358 01/08/16 0756   01/05/16 2030  piperacillin-tazobactam (ZOSYN) IVPB 3.375 g     3.375 g 100 mL/hr over 30 Minutes Intravenous  Once 01/05/16 2021 01/05/16 2157        Note: Portions of this report may have been transcribed using voice recognition software. Every effort was made to ensure accuracy; however, inadvertent computerized transcription errors may be present.   Any  transcriptional errors that result from this process are unintentional.     Adin Hector, M.D., F.A.C.S. Gastrointestinal and Minimally Invasive Surgery [facility] [address] [place], [place] [address] [phone] Main / Paging   01/15/2016

## 2016-01-15 NOTE — Op Note (Addendum)
3:52 PM  PATIENT:  Dana Hodge  65 y.o. female  Patient Care Team: [name], DO as PCP - General (Family Medicine)  PRE-OPERATIVE DIAGNOSIS:  Perforated appendicitis with worsening intraabdominal abscesses  POST-OPERATIVE DIAGNOSIS:  Perforated appendicitis with worsening intraabdominal interloop abscesses x3  PROCEDURE:   LAPAROSCOPIC LYSIS OF ADHESIONS X 75MIN (2/3 CASE) LAPAROSCOPIC DRAINAGE OF INTRA ABDOMINAL ABSCESSES X 3,  LAPAROSCOPIC APPENDECTOMY   SURGEON:  Surgeon(s): Michael Boston, MD  ANESTHESIA:   local and general  EBL:  Total I/O In: 1000 [I.V.:1000] Out: 2100 [Urine:2050; Blood:50]  Delay start of Pharmacological VTE agent (>24hrs) due to surgical blood loss or risk of bleeding:  no  DRAINS: (19Fr) Blake drain(s) in the PELVIS   SPECIMEN:    1.  ABDOMINAL ABSCESS for C&S 2.  APPENDIX for pathology  COUNTS:  YES  PLAN OF CARE: Admit to inpatient   PATIENT DISPOSITION:  PACU - hemodynamically stable.   INDICATIONS: Patient with concerning symptoms & work up suspicious for perforated appendicitis.  He was felt to be several days in the process.  Therefore admitted in cool down with IV antibiotics.  Small abscesses.  Suggested.  Seem to have resolution of ileus and improved.  However, leukocytosis worsened.  Repeat scan showed enlarged abscess.  Interloop abscess not amenable to percutaneous drainage.  Therefore I recommended laparoscopic possible open drainage of abscesses with possible appendectomy versus washout and interval appendectomy six weeks later.  I discussed at length with the patient and her friend.  Discussed with her husband on the phone as well.    The anatomy & physiology of the digestive tract was discussed.  The pathophysiology of appendicitis was discussed.  Natural history risks without surgery was discussed.   I feel the risks of no intervention will lead to serious problems that outweigh the operative risks; therefore, I recommended  diagnostic laparoscopy with removal of appendix to remove the pathology.  Laparoscopic & open techniques were discussed.   I noted a good likelihood this will help address the problem.    Risks such as bleeding, infection, abscess, leak, reoperation, possible ostomy, hernia, heart attack, death, and other risks were discussed.  Goals of post-operative recovery were discussed as well.  We will work to minimize complications.  Questions were answered.  The patient expresses understanding & wishes to proceed with surgery.  OR FINDINGS: R side of abdomen with dense interloop ileal adhesions and interloop abscesses 2.  Left-sided abdomen without any peritonitis.    Frank perforation of mid appendix with 1 cm fecalith with small abscess there.  Base of appendix on cecum noninflamed.  Therefore appendectomy done.  DESCRIPTION:   The patient was identified & brought into the operating room. The patient was positioned supine with arms tucked. SCDs were active during the entire case. The patient underwent general anesthesia without any difficulty.  The abdomen was prepped and draped in a sterile fashion. A Surgical Timeout confirmed our plan. Eye and the abdomen through the left upper quadrant using optical entry technique with the patient in steep reversed in November, left side up.  Entry was clean.  I placed a 15mm port.  We induced carbon dioxide insufflation.  Camera inspection revealed no injury.  I placed additional ports under direct laparoscopic visualization.  Inspection revealed that there were dense inflammation of small bowel interloop in the central and right lower quadrant abdomen.  The ascending colon in bowel was adherent to the pelvis in right anterior down wall.  There  is the abdomen did not have peritonitis.  Freed off the ascending colon off the anterior abdominal wall and followed more inferiorly and mobilized off the anterior abdominal wall.  I proceeded gradually free off small bowel off  its adhesions to itself.  Did this with primarily cold sharp scissor dissection.  Occasional mild blunt hydrodissect and.  Occasional focused harmonic dissection with more dense bands.  Then chose able to mobilize the small bowel off the retroperitoneum.  Encountered a large release of possible.  I aspirated some of that and an suction trap and sent that for culture.  Aspirated a large volume of pus.  Eventually freed small bowel out of the pelvis.  Freddrick March it off the sigmoid colon, uterus, and right adnexa.  I gradually sharply and bluntly freed off the interloop adhesions.  I encountered and released a smaller but definite purulent abscess in the ileal mesentery    I followed down and was able to mobilize the terminal ileum off the retroperitoneum from the pelvic side more superomedially.  I mobilized the terminal ileum to proximal ascending colon in a lateral to medial fashion.  I took care to avoid injuring any retroperitoneal structures.  Could see dense concrete inflammation with an an abscess at the base of the terminal ileal mesentery.  1 cm fecalith was sitting in an abscess there.  I aspirated the abscess and removed the fecalith.  I freed the appendix off its attachments to the terminal ileal mesentery starting distally at the inflamed but nonperforated tip and came more proximally.  Encountered the necrotic mid appendiceal perforation.  I followed the proximal end of the perforated appendix and actually encountered a noninflamed region of the proximal appendix and appendiceal base and cecum.  Could come around the base of the appendix which was not inflamed and looked healthy.  I skeletonized and transected the mesoappendix.   Because the base of the appendix looked healthy & noninflamed along with the cecum, I stapled the appendix off the cecum using a laparoscopic stapler, taking a healthy cuff of normal cecum..  I placed the appendix inside an EndoCatch bag and removed it out the 12 mm port.  I  did copious irrigation of 9 L of crystalloid solution.  Hemostasis was good in the mesoappendix, colon mesentery, and retroperitoneum. Staple line was intact on the cecum with no bleeding. I washed out the pelvis, retrohepatic space and right paracolic gutter. I washed out the left side as well.  Hemostasis is good. There was no perforation or injury to other structures.    I ran the small bowel from the ileocecal region through the ligament of Treitz.  Inflamed segments of ileum but no enterotomy or serosal injury.  The jejunum was rather dilated more proximally.  The stomach had been quite distended on initial evaluation, requiring repositioning of the orogastric tube.  At the end of the case, I asked anesthesia to switch to a nasogastric tube to better decompress the stomach for presumed ileus.   Because there were frank abscesses, I did place a 57 [place] Blake drain through the left flank prot site up towards the hepatic flexure and ran it down the left paracolic gutter down into the pelvis.  Secured at the skin with Prolene suture.   I mobilized the greater omentum out of the upper abdomen and allow that to come down and cover the anterior abdominal wall and reach down towards the pelvis and right lower quadrant.  I did a final irrigation of anabolic  solution 1 L (clindamycin/gentamicin).  I closed the 12 mm fascia site using a 0 Vicryl stitch using a laparoscopic suture passer under direct visualization.   I removed the ports.  I closed skin using 4-0 monocryl stitch.  I allowed the irrigation and come through the [place] drain gradually   Patient was extubated and sent to the recovery room.  I suspect the patient is going used in the hospital at least overnight and will need antibiotics for 5 more  days.   We will follow up on culture to see if antibiotic needs to be adjusted or simplified.  I discussed operative findings, updated the patient's status, discussed probable steps to recovery, and gave  postoperative recommendations to the patient's family.  Recommendations were made.  Questions were answered.  They expressed understanding & appreciation.   Adin Hector, M.D., F.A.C.S. Gastrointestinal and Minimally Invasive Surgery [facility] [address] [place], [place] [address] [phone] Main / Paging

## 2016-01-15 NOTE — Anesthesia Preprocedure Evaluation (Addendum)
Anesthesia Evaluation  Patient identified by MRN, date of birth, ID band Patient awake    Reviewed: Allergy & Precautions, NPO status , Patient's Chart, lab work & pertinent test results  Airway Mallampati: II  TM Distance: >3 FB Neck ROM: Full    Dental  (+) Dental Advisory Given   Pulmonary neg pulmonary ROS,    breath sounds clear to auscultation       Cardiovascular hypertension, Pt. on medications  Rhythm:Regular Rate:Normal     Neuro/Psych negative neurological ROS     GI/Hepatic Neg liver ROS, GERD  ,Acute appendicitis   Endo/Other  negative endocrine ROS  Renal/GU negative Renal ROS     Musculoskeletal   Abdominal   Peds  Hematology negative hematology ROS (+)   Anesthesia Other Findings   Reproductive/Obstetrics                            Lab Results  Component Value Date   WBC 15.9 (H) 01/15/2016   HGB 10.5 (L) 01/15/2016   HCT 30.9 (L) 01/15/2016   MCV 86.3 01/15/2016   PLT 460 (H) 01/15/2016   Lab Results  Component Value Date   CREATININE 0.78 01/14/2016   BUN <5 (L) 01/14/2016   NA 137 01/14/2016   K 3.2 (L) 01/14/2016   CL 107 01/14/2016   CO2 23 01/14/2016    Anesthesia Physical Anesthesia Plan  ASA: II and emergent  Anesthesia Plan: General   Post-op Pain Management:    Induction: Intravenous and Rapid sequence  Airway Management Planned: Oral ETT  Additional Equipment:   Intra-op Plan:   Post-operative Plan: Extubation in OR  Informed Consent: I have reviewed the patients History and Physical, chart, labs and discussed the procedure including the risks, benefits and alternatives for the proposed anesthesia with the patient or authorized representative who has indicated his/her understanding and acceptance.   Dental advisory given  Plan Discussed with: CRNA  Anesthesia Plan Comments:         Anesthesia Quick Evaluation

## 2016-01-15 NOTE — Anesthesia Postprocedure Evaluation (Signed)
Anesthesia Post Note  Patient: Dana Hodge  Procedure(s) Performed: Procedure(s) (LRB): APPENDECTOMY LAPAROSCOPIC. DRAINAGE OF INTRA ABDOMINAL ABSCESSES X 3, LYSIS OF ADHESIONS (N/A)  Patient location during evaluation: PACU Anesthesia Type: General Level of consciousness: awake and alert Pain management: pain level controlled Vital Signs Assessment: post-procedure vital signs reviewed and stable Respiratory status: spontaneous breathing, nonlabored ventilation, respiratory function stable and patient connected to nasal cannula oxygen Cardiovascular status: blood pressure returned to baseline and stable Postop Assessment: no signs of nausea or vomiting Anesthetic complications: no    Last Vitals:  Vitals:   01/15/16 1615 01/15/16 1630  BP: (!) 143/82 (!) 141/81  Pulse: 77 77  Resp: 10 12  Temp:  36.3 C    Last Pain:  Vitals:   01/15/16 1630  TempSrc:   PainSc: 3                  Tiajuana Amass

## 2016-01-15 NOTE — Anesthesia Procedure Notes (Signed)
Procedure Name: Intubation Date/Time: 01/15/2016 1:33 PM Performed by: Danley Danker L Patient Re-evaluated:Patient Re-evaluated prior to inductionOxygen Delivery Method: Circle system utilized Preoxygenation: Pre-oxygenation with 100% oxygen Intubation Type: IV induction Ventilation: Mask ventilation without difficulty and Oral airway inserted - appropriate to patient size Laryngoscope Size: Sabra Heck and 2 Grade View: Grade I Tube type: Oral Tube size: 7.0 mm Number of attempts: 1 Airway Equipment and Method: Stylet Placement Confirmation: ETT inserted through vocal cords under direct vision,  positive ETCO2 and breath sounds checked- equal and bilateral Secured at: 21 cm Tube secured with: Tape Dental Injury: Teeth and Oropharynx as per pre-operative assessment

## 2016-01-15 NOTE — Transfer of Care (Signed)
Immediate Anesthesia Transfer of Care Note  Patient: Dana Hodge  Procedure(s) Performed: Procedure(s): APPENDECTOMY LAPAROSCOPIC. DRAINAGE OF INTRA ABDOMINAL ABSCESSES X 3, LYSIS OF ADHESIONS (N/A)  Patient Location: PACU  Anesthesia Type:General  Level of Consciousness: awake and oriented  Airway & Oxygen Therapy: Patient Spontanous Breathing and Patient connected to face mask oxygen  Post-op Assessment: Report given to RN and Post -op Vital signs reviewed and stable  Post vital signs: Reviewed and stable  Last Vitals:  Vitals:   01/15/16 0515 01/15/16 1225  BP: 124/73 (!) 160/93  Pulse: 87 91  Resp: 18 20  Temp: 37.1 C 36.9 C    Last Pain:  Vitals:   01/15/16 1225  TempSrc: Oral  PainSc: 0-No pain      Patients Stated Pain Goal: 3 ([id number])  Complications: No apparent anesthesia complications

## 2016-01-15 NOTE — Progress Notes (Signed)
Spoke with Dr. Natividad Brood, anesthesiologist, about labs, CXR and EKG orders entered by Dr. Johney Maine  prior to surgery. MD reported no need for EKG or CXR at this time. Labs ok therefore no repeat labs necessary prior to surgery per Dr. Ola Spurr.

## 2016-01-16 LAB — BASIC METABOLIC PANEL
ANION GAP: 9 (ref 5–15)
BUN: 13 mg/dL (ref 6–20)
CHLORIDE: 103 mmol/L (ref 101–111)
CO2: 24 mmol/L (ref 22–32)
Calcium: 8.9 mg/dL (ref 8.9–10.3)
Creatinine, Ser: 0.83 mg/dL (ref 0.44–1.00)
GFR calc Af Amer: >60 mL/min (ref >60)
Glucose, Bld: 124 mg/dL — ABNORMAL HIGH (ref 65–99)
POTASSIUM: 5 mmol/L (ref 3.5–5.1)
SODIUM: 136 mmol/L (ref 135–145)

## 2016-01-16 LAB — CBC
HCT: 33.6 % — ABNORMAL LOW (ref 36.0–46.0)
Hemoglobin: 11.2 g/dL — ABNORMAL LOW (ref 12.0–15.0)
MCH: 29.2 pg (ref 26.0–34.0)
MCHC: 33.3 g/dL (ref 30.0–36.0)
MCV: 87.5 fL (ref 78.0–100.0)
PLATELETS: 479 10*3/uL — AB (ref 150–400)
RBC: 3.84 MIL/uL — ABNORMAL LOW (ref 3.87–5.11)
RDW: 14.1 % (ref 11.5–15.5)
WBC: 32.6 10*3/uL — ABNORMAL HIGH (ref 4.0–10.5)

## 2016-01-16 LAB — MAGNESIUM: MAGNESIUM: 2.3 mg/dL (ref 1.7–2.4)

## 2016-01-16 MED ORDER — SODIUM CHLORIDE 0.9 % IV SOLN
100.0000 mg | INTRAVENOUS | Status: DC
  Administered 2016-01-17 – 2016-01-20 (×4): 100 mg via INTRAVENOUS
  Filled 2016-01-16 (×4): qty 100

## 2016-01-16 MED ORDER — SODIUM CHLORIDE 0.9 % IV SOLN: 100.0000 mg | INTRAVENOUS | Status: DC

## 2016-01-16 MED ORDER — SODIUM CHLORIDE 0.9 % IV SOLN
200.0000 mg | Freq: Once | INTRAVENOUS | Status: AC
  Administered 2016-01-16: 200 mg via INTRAVENOUS
  Filled 2016-01-16: qty 200

## 2016-01-16 MED ORDER — DIPHENHYDRAMINE HCL 50 MG/ML IJ SOLN: 12.5000 mg | Freq: Four times a day (QID) | INTRAMUSCULAR | Status: DC | PRN

## 2016-01-16 MED ORDER — SODIUM CHLORIDE 0.9 % IV SOLN
100.0000 mg | Freq: Every day | INTRAVENOUS | Status: DC
  Administered 2016-01-16: 100 mg via INTRAVENOUS
  Filled 2016-01-16: qty 100

## 2016-01-16 MED ORDER — SODIUM CHLORIDE 0.9 % IV SOLN: 200.0000 mg | Freq: Once | INTRAVENOUS | Status: DC

## 2016-01-16 MED ORDER — MORPHINE SULFATE (PF) 2 MG/ML IV SOLN
2.0000 mg | INTRAVENOUS | Status: DC | PRN
  Administered 2016-01-16 – 2016-01-19 (×3): 2 mg via INTRAVENOUS
  Filled 2016-01-16 (×3): qty 1

## 2016-01-16 NOTE — Progress Notes (Signed)
[address] [place], [place] [address] Phone: [phone] FAX: [place] [phone] [birth year]  CARE TEAM:  PCP: [name]., DO  Outpatient Care Team: Patient Care Team: [name], DO as PCP - General (Family Medicine)  Inpatient Treatment Team: Treatment Team: Attending Provider: Nolon Nations, MD; Technician: Tenna Child, [place]; Technician: Sueanne Margarita, NT; Registered Nurse: Celedonio Savage, RN; Registered Nurse: Bailey Mech, RN; Technician: Abbe Amsterdam, NT; Technician: Etheleen Sia, NT; Registered Nurse: Mertha Baars, RN; Registered Nurse: Joselyn Glassman, RN; Registered Nurse: Candie Chroman, RN  Problem List:   Principal Problem:   Acute appendicitis with perforation and peritoneal abscess s/p lap appendectomy 01/15/2016 Active Problems:   Hyperlipemia   Essential hypertension   Esophageal reflux   Hypokalemia   Abdominal abscesses x3 s/p lap LOA & drainage 01/15/2016   1 Day Post-Op  01/15/2016  POST-OPERATIVE DIAGNOSIS:  Perforated appendicitis with worsening intraabdominal interloop abscesses x3  PROCEDURE:   LAPAROSCOPIC LYSIS OF ADHESIONS X 75MIN (2/3 CASE) LAPAROSCOPIC DRAINAGE OF INTRA ABDOMINAL ABSCESSES X 3,  LAPAROSCOPIC APPENDECTOMY   SURGEON:  Surgeon(s): Michael Boston, MD   Assessment  Sore but stable  Plan:  Nasogastric tube for ileus.  IV antibiotics.  Follow-up on cultures.  Switch from fluconazole to more broad antifungal coverage given the fact the abscess had yeast growing in the cultures despite being on fluconazole.  Continue ertapenem.Adjust antibiotics to finding on final result.  Continue drain until ileus resolved and white count normal.  CT scan in 5-7 days if persistent white count or failure to thrive or deterioration to rule out recurrent abscess.  Mobilize as tolerated for recovery.  Hold on potassium  supplementation.  Follow.  Follow blood pressure.  Control hypertension when necessary.  Pain control.  Nausea control.       Adin Hector, M.D., F.A.C.S. Gastrointestinal and Minimally Invasive Surgery [facility] [address], [place], [place] [address] [phone] Main / Paging   01/16/2016  Subjective:  Sitting up at bedside.  Nasogastric tube irritating.  Some left-sided shoulder pain.    Friend at bedside.  Objective:  Vital signs:  Vitals:   01/15/16 2000 01/15/16 2124 01/16/16 0218 01/16/16 0645  BP: 132/78 130/73 115/71 116/67  Pulse: 81 77 84 83  Resp: 18 18    Temp: 97.7 F (36.5 C) 97.8 F (36.6 C) 97.8 F (36.6 C) 98.1 F (36.7 C)  TempSrc: Oral Oral Oral Oral  SpO2: 99% 100% 97% 98%  Weight:      Height:        Last BM Date: 01/15/16  Intake/Output   Yesterday:  11/25 0701 - 11/26 0700 In: 1550 [I.V.:1400; NG/GT:100; IV Piggyback:50] Out: 2094 [Urine:2790; Drains:350; Blood:50] This shift:  Total I/O In: -  Out: 70 [Drains:70]  Bowel function:  Flatus: No  BM:  No  Drain: Serosanguinous   Physical Exam:  General: Pt awake/alert/oriented x4 in No acute distress Eyes: PERRL, normal EOM.  Sclera clear.  No icterus Neuro: CN II-XII intact w/o focal sensory/motor deficits. Lymph: No head/neck/groin lymphadenopathy Psych:  No delerium/psychosis/paranoia HENT: Normocephalic, Mucus membranes moist.  No thrush Neck: Supple, No tracheal deviation Chest: No chest wall pain w good excursion CV:  Pulses intact.  Regular rhythm MS: Normal AROM mjr joints.  No obvious deformity Abdomen: Somewhat firm.  Mildy distended.  Tenderness at Right  lower quadrant but less overall.  No evidence of peritonitis.  No incarcerated hernias. Ext:  SCDs BLE.  No mjr edema.  No cyanosis Skin: No petechiae / purpura  Results:   Labs: Results for orders placed or performed during the hospital encounter of  01/05/16 (from the past 48 hour(s))  CBC     Status: Abnormal   Collection Time: 01/15/16  5:11 AM  Result Value Ref Range   WBC 15.9 (H) 4.0 - 10.5 K/uL   RBC 3.58 (L) 3.87 - 5.11 MIL/uL   Hemoglobin 10.5 (L) 12.0 - 15.0 g/dL   HCT 30.9 (L) 36.0 - 46.0 %   MCV 86.3 78.0 - 100.0 fL   MCH 29.3 26.0 - 34.0 pg   MCHC 34.0 30.0 - 36.0 g/dL   RDW 14.1 11.5 - 15.5 %   Platelets 460 (H) 150 - 400 K/uL  MRSA PCR Screening     Status: None   Collection Time: 01/15/16 11:20 AM  Result Value Ref Range   MRSA by PCR NEGATIVE NEGATIVE    Comment:        The GeneXpert MRSA Assay (FDA approved for NASAL specimens only), is one component of a comprehensive MRSA colonization surveillance program. It is not intended to diagnose MRSA infection nor to guide or monitor treatment for MRSA infections.   Aerobic/Anaerobic Culture (surgical/deep wound)     Status: None (Preliminary result)   Collection Time: 01/15/16  2:39 PM  Result Value Ref Range   Specimen Description PERITONEAL    Special Requests NONE    Gram Stain      ABUNDANT WBC PRESENT, PREDOMINANTLY PMN FEW GRAM VARIABLE ROD RARE GRAM POSITIVE COCCI IN CHAINS RARE YEAST Performed at [facility]    Culture PENDING    Report Status PENDING   CBC     Status: Abnormal   Collection Time: 01/16/16  4:41 AM  Result Value Ref Range   WBC 32.6 (H) 4.0 - 10.5 K/uL   RBC 3.84 (L) 3.87 - 5.11 MIL/uL   Hemoglobin 11.2 (L) 12.0 - 15.0 g/dL   HCT 33.6 (L) 36.0 - 46.0 %   MCV 87.5 78.0 - 100.0 fL   MCH 29.2 26.0 - 34.0 pg   MCHC 33.3 30.0 - 36.0 g/dL   RDW 14.1 11.5 - 15.5 %   Platelets 479 (H) 150 - 400 K/uL  Basic metabolic panel     Status: Abnormal   Collection Time: 01/16/16  4:41 AM  Result Value Ref Range   Sodium 136 135 - 145 mmol/L   Potassium 5.0 3.5 - 5.1 mmol/L    Comment: DELTA CHECK NOTED REPEATED TO VERIFY NO VISIBLE HEMOLYSIS    Chloride 103 101 - 111 mmol/L   CO2 24 22 - 32 mmol/L   Glucose, Bld 124 (H)  65 - 99 mg/dL   BUN 13 6 - 20 mg/dL   Creatinine, Ser 0.83 0.44 - 1.00 mg/dL   Calcium 8.9 8.9 - 10.3 mg/dL   GFR calc non Af Amer >60 >60 mL/min   GFR calc Af Amer >60 >60 mL/min    Comment: (NOTE) The eGFR has been calculated using the CKD EPI equation. This calculation has not been validated in all clinical situations. eGFR's persistently <60 mL/min signify possible Chronic Kidney Disease.    Anion gap 9 5 - 15  Magnesium     Status: None   Collection Time: 01/16/16  4:41 AM  Result Value Ref Range   Magnesium 2.3  1.7 - 2.4 mg/dL    Imaging / Studies: Ct Abdomen Pelvis W Contrast  Result Date: 01/15/2016 CLINICAL DATA:  Re-evaluate periappendiceal abscess EXAM: CT ABDOMEN AND PELVIS WITH CONTRAST TECHNIQUE: Multidetector CT imaging of the abdomen and pelvis was performed using the standard protocol following bolus administration of intravenous contrast. CONTRAST:  89m ISOVUE-300 IOPAMIDOL (ISOVUE-300) INJECTION 61%, 1074mISOVUE-300 IOPAMIDOL (ISOVUE-300) INJECTION 61% COMPARISON:  01/10/2016 FINDINGS: Lower chest: Small bilateral pleural effusions are noted. Hepatobiliary: Cholelithiasis is again identified. No abnormality is noted within the liver. Pancreas: Unremarkable. No pancreatic ductal dilatation or surrounding inflammatory changes. The previously seen fluid collection adjacent to the pancreas has resolved in the interval. Spleen: Normal in size without focal abnormality. Adrenals/Urinary Tract: Adrenal glands are unremarkable. Kidneys are normal, without renal calculi, focal lesion, or hydronephrosis. Bladder is unremarkable. Stomach/Bowel: Interval decrease in the degree of small bowel dilatation. Contrast material 0 passes into the colon without difficulty. An appendicolith is again noted with dilatation of the appendix similar to that seen on prior exam. Now represents only fluid and measures approximately 2.6 by 1.8 cm in greatest dimension. This is an overall decrease in  size when compared with the prior study. There is a fluid collection identified within the mesentery however with enhancing borders consistent with an organizing abscess. This measures 5.3 x 3.7 cm and has increased in size from the prior exam at which time it measured just under 4 cm. A small amount of air is now noted within which was not present on the prior exam. A slightly smaller 1 cm fluid collection is noted on image number 59 of series 2 which has decreased in the interval from the prior exam. Vascular/Lymphatic: No significant vascular findings are present. No enlarged abdominal or pelvic lymph nodes. Reproductive: Uterus and bilateral adnexa are unremarkable. Other: Some fluid remains in the pelvis with an enhancing border suggesting peritonitis. Musculoskeletal: No acute abnormality noted. IMPRESSION: The previously seen periappendiceal air-fluid collection now shows only fluid and has decreased in size somewhat. There has however been increase in size of the fluid collection within the mesenteric. It demonstrates enhancing borders and some small amount of air within. This fluid collection is not amenable to percutaneous drainage at this time. The appendix remains dilated with a central appendicolith. Other smaller fluid collections are noted which have improved in the interval from the prior exam. The fluid in the pelvis with enhancing borders is relatively stable. Resolution of previously seen small bowel dilatation which was likely reactive in nature. Cholelithiasis without complicating factors. Resolution of peripancreatic fluid collection. Electronically Signed   By: MaInez Catalina.D.   On: 01/15/2016 09:41    Medications / Allergies: per chart  Antibiotics: Anti-infectives    Start     Dose/Rate Route Frequency Ordered Stop   01/17/16 2200  anidulafungin (ERAXIS) 100 mg in sodium chloride 0.9 % 100 mL IVPB     100 mg over 90 Minutes Intravenous Every 24 hours 01/16/16 0610 01/22/16 2159    01/16/16 2200  anidulafungin (ERAXIS) 200 mg in sodium chloride 0.9 % 200 mL IVPB     200 mg over 180 Minutes Intravenous  Once 01/16/16 0610     01/16/16 0330  micafungin (MYCAMINE) 100 mg in sodium chloride 0.9 % 100 mL IVPB  Status:  Discontinued    Comments:  Yeast in chronic intraabdominal abscess Pharmacy may adjust dosing strength, schedule, rate of infusion, etc as needed to optimize therapy   100 mg 100 mL/hr over 1  Hours Intravenous Daily at bedtime 01/16/16 0307 01/16/16 0609   01/16/16 0315  micafungin (MYCAMINE) 100 mg in sodium chloride 0.9 % 100 mL IVPB  Status:  Discontinued    Comments:  Yeast in chronic intraabdominal abscess Pharmacy may adjust dosing strength, schedule, rate of infusion, etc as needed to optimize therapy   100 mg 100 mL/hr over 1 Hours Intravenous Every 24 hours 01/16/16 0306 01/16/16 0307   01/15/16 1522  clindamycin (CLEOCIN) 900 mg, gentamicin (GARAMYCIN) 240 mg in sodium chloride 0.9 % 1,000 mL for intraperitoneal lavage  Status:  Discontinued       As needed 01/15/16 1522 01/15/16 1556   01/15/16 1400  clindamycin (CLEOCIN) 900 mg, gentamicin (GARAMYCIN) 240 mg in sodium chloride 0.9 % 1,000 mL for intraperitoneal lavage  Status:  Discontinued    Comments:  Have in the  OR room for final irrigation in bowel surgery case to minimize risk of abscess/infection Pharmacy may adjust dosing strength, schedule, rate of infusion, etc as needed to optimize therapy    Intraperitoneal On call to O.R. 01/15/16 1353 01/15/16 1657   01/13/16 1600  ertapenem (INVANZ) 1 g in sodium chloride 0.9 % 50 mL IVPB     1 g 100 mL/hr over 30 Minutes Intravenous Every 24 hours 01/13/16 1058 01/21/16 0306   01/13/16 1200  fluconazole (DIFLUCAN) tablet 200 mg  Status:  Discontinued     200 mg Oral Daily 01/13/16 1058 01/16/16 0306   01/08/16 1000  piperacillin-tazobactam (ZOSYN) IVPB 3.375 g  Status:  Discontinued     3.375 g 12.5 mL/hr over 240 Minutes Intravenous Every 8  hours 01/08/16 0808 01/13/16 1058   01/06/16 0015  cefTRIAXone (ROCEPHIN) 2 g in dextrose 5 % 50 mL IVPB  Status:  Discontinued     2 g 100 mL/hr over 30 Minutes Intravenous Daily at bedtime 01/05/16 2358 01/08/16 0756   01/05/16 2358  metroNIDAZOLE (FLAGYL) IVPB 500 mg  Status:  Discontinued     500 mg 100 mL/hr over 60 Minutes Intravenous Every 8 hours 01/05/16 2358 01/08/16 0756   01/05/16 2030  piperacillin-tazobactam (ZOSYN) IVPB 3.375 g     3.375 g 100 mL/hr over 30 Minutes Intravenous  Once 01/05/16 2021 01/05/16 2157        Note: Portions of this report may have been transcribed using voice recognition software. Every effort was made to ensure accuracy; however, inadvertent computerized transcription errors may be present.   Any transcriptional errors that result from this process are unintentional.     Adin Hector, M.D., F.A.C.S. Gastrointestinal and Minimally Invasive Surgery [facility] [address], [place], [place] [address] [phone] Main / Paging   01/16/2016

## 2016-01-17 ENCOUNTER — Encounter (HOSPITAL_COMMUNITY): Payer: Self-pay | Admitting: Surgery

## 2016-01-17 LAB — CREATININE, SERUM
Creatinine, Ser: 0.84 mg/dL (ref 0.44–1.00)
GFR calc Af Amer: >60 mL/min (ref >60)
GFR calc non Af Amer: >60 mL/min (ref >60)

## 2016-01-17 LAB — CBC
HEMATOCRIT: 29.3 % — AB (ref 36.0–46.0)
Hemoglobin: 9.8 g/dL — ABNORMAL LOW (ref 12.0–15.0)
MCH: 29.5 pg (ref 26.0–34.0)
MCHC: 33.4 g/dL (ref 30.0–36.0)
MCV: 88.3 fL (ref 78.0–100.0)
Platelets: 450 10*3/uL — ABNORMAL HIGH (ref 150–400)
RBC: 3.32 MIL/uL — ABNORMAL LOW (ref 3.87–5.11)
RDW: 14.6 % (ref 11.5–15.5)
WBC: 19.1 10*3/uL — ABNORMAL HIGH (ref 4.0–10.5)

## 2016-01-17 LAB — POTASSIUM: Potassium: 4.3 mmol/L (ref 3.5–5.1)

## 2016-01-17 MED ORDER — FAMOTIDINE IN NACL 20-0.9 MG/50ML-% IV SOLN
20.0000 mg | Freq: Two times a day (BID) | INTRAVENOUS | Status: DC
  Administered 2016-01-17 – 2016-01-20 (×8): 20 mg via INTRAVENOUS
  Filled 2016-01-17 (×9): qty 50

## 2016-01-17 MED ORDER — DEXTROSE-NACL 5-0.9 % IV SOLN
INTRAVENOUS | Status: DC
  Administered 2016-01-18: 10:00:00 via INTRAVENOUS
  Administered 2016-01-18: 1000 mL via INTRAVENOUS
  Administered 2016-01-19 – 2016-01-21 (×5): via INTRAVENOUS

## 2016-01-17 MED ORDER — ACETAMINOPHEN 10 MG/ML IV SOLN
1000.0000 mg | Freq: Four times a day (QID) | INTRAVENOUS | Status: AC
  Administered 2016-01-17 – 2016-01-18 (×4): 1000 mg via INTRAVENOUS
  Filled 2016-01-17 (×5): qty 100

## 2016-01-17 NOTE — Progress Notes (Signed)
2 Days Post-Op  Subjective: No flatus, NG looks like its a bit bloody. , H/H down some.  No significant pain.  NG bothers her almost as much as there surgery and drain.  Drainage is serosanguinous.    Objective: Vital signs in last 24 hours: Temp:  [98.1 F (36.7 C)-98.2 F (36.8 C)] 98.1 F (36.7 C) (11/27 0540) Pulse Rate:  [85-89] 85 (11/27 0540) Resp:  [16] 16 (11/27 0540) BP: (120-136)/(62-69) 136/69 (11/27 0540) SpO2:  [97 %-99 %] 99 % (11/27 0540) Last BM Date: 01/15/16 NPO No IV fluids listed Urine 700 NG 860 listed Drain 180 listed Afebrile, VSS WBC down to 19.1 this AM  - 32.6K yesterday Intake/Output from previous day: 11/26 0701 - 11/27 0700 In: 3 [I.V.:3] Out: 1740 [Urine:700; Emesis/NG output:860; Drains:180] Intake/Output this shift: Total I/O In: 370 [P.O.:60; Other:10; NG/GT:250; IV Piggyback:50] Out: 241 [Urine:125; Emesis/NG output:100; Drains:16]  General appearance: alert, cooperative and no distress Resp: clear to auscultation bilaterally GI: soft, no BS, sites OK, drainage is serosanguinous, no flatus.Not distended or overly tender   Lab Results:   Recent Labs  01/16/16 0441 01/17/16 0436  WBC 32.6* 19.1*  HGB 11.2* 9.8*  HCT 33.6* 29.3*  PLT 479* 450*    BMET  Recent Labs  01/16/16 0441 01/17/16 0436  NA 136  --   K 5.0 4.3  CL 103  --   CO2 24  --   GLUCOSE 124*  --   BUN 13  --   CREATININE 0.83 0.84  CALCIUM 8.9  --    PT/INR No results for input(s): LABPROT, INR in the last 72 hours.  No results for input(s): AST, ALT, ALKPHOS, BILITOT, PROT, ALBUMIN in the last 168 hours.   Lipase     Component Value Date/Time   LIPASE 25 01/05/2016 1804     Studies/Results: No results found. Prior to Admission medications   Medication Sig Start Date End Date Taking? Authorizing Provider  aspirin 81 MG tablet Take 81 mg by mouth daily.   Yes Historical Provider, MD  hydrochlorothiazide (HYDRODIURIL) 25 MG tablet TAKE ONE TABLET  BY MOUTH ONCE DAILY 12/23/15  Yes Dana Kern, DO  lisinopril (PRINIVIL,ZESTRIL) 10 MG tablet TAKE ONE TABLET BY MOUTH ONCE DAILY 10/12/15  Yes Dana Kern, DO  MAGNESIUM PO Take 250 mg by mouth 2 (two) times daily.    Yes Historical Provider, MD  senna (SENOKOT) 8.6 MG TABS tablet Take 1-2 tablets by mouth at bedtime as needed for mild constipation.   Yes Historical Provider, MD    Medications: . anidulafungin  100 mg Intravenous Q24H  . enoxaparin (LOVENOX) injection  40 mg Subcutaneous QHS  . ertapenem  1 g Intravenous Q24H  . ketorolac  15 mg Intravenous Q6H  . lip balm  1 application Topical BID  . sodium chloride flush  3 mL Intravenous Q12H   NO IV fluids listed   Assessment/Plan Perforated appendicitis with worsening intraabdominal interloop abscesses x3 S/p LAPAROSCOPIC LYSIS OF ADHESIONS X 75MIN (2/3 CASE) LAPAROSCOPIC DRAINAGE OF INTRA ABDOMINAL ABSCESSES X 3,  LAPAROSCOPIC APPENDECTOMY, 01/15/16, Dr. Johney Hodge Ileus  Hypertension FEN:  NPO/ ID:  Admit 11/15-11/17 Ceftriaxone/Flagyl;  11/18-11/23 Zosyn;  11/23-11/25 diflucan, Anidulafungin 111/26 =>> day 2;   11/23 =>> Ertapenem day 5 DVT:  Lovenox  Plan:  Continue current Rx, stop IV Toradol, add IV H2, mobilize more and let her bowel function return. Adding IV tylenol for pain, recheck labs in AM.  LOS: 11 days    Dana Hodge 01/17/2016 [phone]

## 2016-01-17 NOTE — Progress Notes (Signed)
Initial Nutrition Assessment  INTERVENTION:   Recommend obtaining weight, last recorded 11/16.  Diet advancement per surgery  If patient unable to have diet advanced by 12/1, will need to consider nutrition support.  RD will continue to monitor for plan  NUTRITION DIAGNOSIS:   Inadequate oral intake related to inability to eat as evidenced by NPO status.  GOAL:   Patient will meet greater than or equal to 90% of their needs  MONITOR:   Diet advancement, Labs, Weight trends, I & O's  REASON FOR ASSESSMENT:   LOS (x 11 days)   ASSESSMENT:   65 y.o. F with abdominal pain since yesterday.  She reports diffuse abdominal pain, nausea, vomiting and diarrhea yesterday that worsened in intensity today. 11/25: s/p LAPAROSCOPIC LYSIS OF ADHESIONS X 75MIN (2/3 CASE) LAPAROSCOPIC DRAINAGE OF INTRA ABDOMINAL ABSCESSES X 3,  LAPAROSCOPIC APPENDECTOMY   Patient in room with husband and visitor at bedside. Pt reports prior to hospitalization she consumed a taco salad on 11/13. Pt was placed on a diet 11/23 and advanced to soft diet on 11/24 (consumed 2 meals). However, pt failed non-operative treatments. She was made NPO. She was unable to have diet advanced d/t ileus post surgery. Pt has a NGT placed for suction, output noted in room: 300 ml. Pt denies any nausea at this time. States she gets medication prior to antibiotics are started because they cause nausea.  Pt states her UBW is 160 lb. Pt with no weight loss prior admission. Will need new weight obtained to assess weight status. Nutrition focused physical exam shows no sign of depletion of muscle mass or body fat.   Medications: IV Zofran every 6 hours PRN Labs reviewed: Mg/K WNL  Diet Order:  Diet NPO time specified Except for: Ice Chips  Skin:  Wound (see comment) (11/25 abdominal incision)  Last BM:  11/25  Height:   Ht Readings from Last 1 Encounters:  01/06/16 5\' 5"  (1.651 m)    Weight:   Wt Readings from Last 1  Encounters:  01/06/16 162 lb 1.6 oz (73.5 kg)    Ideal Body Weight:  56.8 kg  BMI:  Body mass index is 26.97 kg/m.  Estimated Nutritional Needs:   Kcal:  1800-2000  Protein:  75-85g  Fluid:  1.8-1.2L/day  EDUCATION NEEDS:   No education needs identified at this time   Clayton Bibles, MS, RD, LDN Pager: [phone] After Hours Pager: [phone]

## 2016-01-18 LAB — CBC
HCT: 30.5 % — ABNORMAL LOW (ref 36.0–46.0)
Hemoglobin: 10.1 g/dL — ABNORMAL LOW (ref 12.0–15.0)
MCH: 29.3 pg (ref 26.0–34.0)
MCHC: 33.1 g/dL (ref 30.0–36.0)
MCV: 88.4 fL (ref 78.0–100.0)
PLATELETS: 473 10*3/uL — AB (ref 150–400)
RBC: 3.45 MIL/uL — ABNORMAL LOW (ref 3.87–5.11)
RDW: 14.5 % (ref 11.5–15.5)
WBC: 14.4 10*3/uL — ABNORMAL HIGH (ref 4.0–10.5)

## 2016-01-18 LAB — BASIC METABOLIC PANEL
Anion gap: 9 (ref 5–15)
BUN: 30 mg/dL — AB (ref 6–20)
CALCIUM: 9 mg/dL (ref 8.9–10.3)
CO2: 26 mmol/L (ref 22–32)
Chloride: 103 mmol/L (ref 101–111)
Creatinine, Ser: 0.89 mg/dL (ref 0.44–1.00)
GFR calc Af Amer: >60 mL/min (ref >60)
GLUCOSE: 82 mg/dL (ref 65–99)
Potassium: 4.3 mmol/L (ref 3.5–5.1)
Sodium: 138 mmol/L (ref 135–145)

## 2016-01-18 NOTE — Progress Notes (Signed)
3 Days Post-Op  Subjective: She continues to improve, sites ok, some flatus.  Walking more but it hurts to cough.    Objective: Vital signs in last 24 hours: Temp:  [97.9 F (36.6 C)-98.6 F (37 C)] 97.9 F (36.6 C) (11/28 0610) Pulse Rate:  [79-90] 79 (11/28 0610) Resp:  [16] 16 (11/28 0610) BP: (122-142)/(68-80) 135/74 (11/28 0610) SpO2:  [98 %-99 %] 99 % (11/28 0610) Last BM Date: 01/15/16 530 PO Urine 1275 NG 1300 Drain 40 Afebrile, VSS WBC trending dow, BMP OK  Intake/Output from previous day: 11/27 0701 - 11/28 0700 In: [place] [P.O.:530; IV Piggyback:630] Out: 2615 [Urine:1275; Emesis/NG output:1300; Drains:40] Intake/Output this shift: No intake/output data recorded.  General appearance: alert, cooperative and no distress Resp: clear to auscultation bilaterally GI: soft, sore, sites ok Drainage is serosanguinous   Lab Results:   Recent Labs  01/17/16 0436 01/18/16 0445  WBC 19.1* 14.4*  HGB 9.8* 10.1*  HCT 29.3* 30.5*  PLT 450* 473*    BMET  Recent Labs  01/16/16 0441 01/17/16 0436 01/18/16 0445  NA 136  --  138  K 5.0 4.3 4.3  CL 103  --  103  CO2 24  --  26  GLUCOSE 124*  --  82  BUN 13  --  30*  CREATININE 0.83 0.84 0.89  CALCIUM 8.9  --  9.0   PT/INR No results for input(s): LABPROT, INR in the last 72 hours.  No results for input(s): AST, ALT, ALKPHOS, BILITOT, PROT, ALBUMIN in the last 168 hours.   Lipase     Component Value Date/Time   LIPASE 25 01/05/2016 1804     Studies/Results: No results found.  Medications: . acetaminophen  1,000 mg Intravenous Q6H  . anidulafungin  100 mg Intravenous Q24H  . enoxaparin (LOVENOX) injection  40 mg Subcutaneous QHS  . ertapenem  1 g Intravenous Q24H  . famotidine (PEPCID) IV  20 mg Intravenous Q12H  . lip balm  1 application Topical BID  . sodium chloride flush  3 mL Intravenous Q12H    Assessment/Plan Perforated appendicitis with worsening intraabdominal interloop abscesses x3 S/p  LAPAROSCOPIC LYSIS OF ADHESIONS X 75MIN (2/3 CASE) LAPAROSCOPIC DRAINAGE OF INTRA ABDOMINAL ABSCESSES X 3,  LAPAROSCOPIC APPENDECTOMY, 01/15/16, Dr. Johney Maine Ileus  Hypertension FEN:  NPO/ ID:  Admit 11/15-11/17 Ceftriaxone/Flagyl;  11/18-11/23 Zosyn;  11/23-11/25 diflucan, Anidulafungin 111/26 =>> day 3;   11/23 =>> Ertapenem day 6 DVT:  Lovenox    Plan:  NG clamping trials today, hope to get NG out tomorrow.     LOS: 12 days    Jacques Fife 01/18/2016 [phone]

## 2016-01-19 LAB — AEROBIC/ANAEROBIC CULTURE (SURGICAL/DEEP WOUND)

## 2016-01-19 LAB — AEROBIC/ANAEROBIC CULTURE W GRAM STAIN (SURGICAL/DEEP WOUND)

## 2016-01-19 NOTE — Progress Notes (Signed)
4 Days Post-Op  Subjective: She looks good with some bowel function, NG clamped all night no nausea, small stool yesterday AM.  Objective: Vital signs in last 24 hours: Temp:  [98.2 F (36.8 C)-99.3 F (37.4 C)] 98.2 F (36.8 C) (11/29 0538) Pulse Rate:  [81-91] 81 (11/29 0538) Resp:  [16] 16 (11/29 0538) BP: (127-169)/(83-88) 127/88 (11/29 0538) SpO2:  [97 %-100 %] 97 % (11/29 0538) Last BM Date: 01/15/16 120 PO 2000 IVurine 2300 NG 610 Drain 28 Afebrile, VSS No labs Intake/Output from previous day: 11/28 0701 - 11/29 0700 In: 2354.7 [P.O.:120; I.V.:2004.7; IV Piggyback:230] Out: 1728 [Urine:1090; Emesis/NG output:610; Drains:28] Intake/Output this shift: Total I/O In: -  Out: 300 [Urine:300]  General appearance: alert, cooperative and no distress Resp: clear to auscultation bilaterally GI: soft, sore, sites look good, + BS and flatus  Lab Results:   Recent Labs  01/17/16 0436 01/18/16 0445  WBC 19.1* 14.4*  HGB 9.8* 10.1*  HCT 29.3* 30.5*  PLT 450* 473*    BMET  Recent Labs  01/17/16 0436 01/18/16 0445  NA  --  138  K 4.3 4.3  CL  --  103  CO2  --  26  GLUCOSE  --  82  BUN  --  30*  CREATININE 0.84 0.89  CALCIUM  --  9.0   PT/INR No results for input(s): LABPROT, INR in the last 72 hours.  No results for input(s): AST, ALT, ALKPHOS, BILITOT, PROT, ALBUMIN in the last 168 hours.   Lipase     Component Value Date/Time   LIPASE 25 01/05/2016 1804     Studies/Results: No results found.  Medications: . anidulafungin  100 mg Intravenous Q24H  . enoxaparin (LOVENOX) injection  40 mg Subcutaneous QHS  . ertapenem  1 g Intravenous Q24H  . famotidine (PEPCID) IV  20 mg Intravenous Q12H  . lip balm  1 application Topical BID  . sodium chloride flush  3 mL Intravenous Q12H    Assessment/Plan Perforated appendicitis with worsening intraabdominal interloop abscesses x3 S/p LAPAROSCOPIC LYSIS OF ADHESIONS X 75MIN (2/3 CASE) LAPAROSCOPIC  DRAINAGE OF INTRA ABDOMINAL ABSCESSES X 3,  LAPAROSCOPIC APPENDECTOMY, 01/15/16, Dr. Johney Maine Ileus  Hypertension FEN: NPO/ ID: Admit 11/15-11/17 Ceftriaxone/Flagyl; 11/18-11/23 Zosyn; 11/23-11/25 diflucan, Anidulafungin111/26 =>>day 4; 11/23 =>>Ertapenem day 7 DVT: Lovenox    Plan: d/c NG and start liquids.     LOS: 13 days    Dana Hodge 01/19/2016 [phone]

## 2016-01-20 LAB — CBC
HEMATOCRIT: 29.2 % — AB (ref 36.0–46.0)
HEMOGLOBIN: 9.6 g/dL — AB (ref 12.0–15.0)
MCH: 29.1 pg (ref 26.0–34.0)
MCHC: 32.9 g/dL (ref 30.0–36.0)
MCV: 88.5 fL (ref 78.0–100.0)
Platelets: 478 10*3/uL — ABNORMAL HIGH (ref 150–400)
RBC: 3.3 MIL/uL — AB (ref 3.87–5.11)
RDW: 13.9 % (ref 11.5–15.5)
WBC: 8.2 10*3/uL (ref 4.0–10.5)

## 2016-01-20 NOTE — Progress Notes (Signed)
5 Days Post-Op  Subjective: She looks good, walking feels better.  Someone put her on isolation because of a culture report and has her very anxious. The only culture I see is from 01/15/16 and she has been on antifungal since 01/13/16.    Objective: Vital signs in last 24 hours: Temp:  [98.5 F (36.9 C)-99.3 F (37.4 C)] 98.5 F (36.9 C) (11/30 0449) Pulse Rate:  [80-84] 81 (11/30 0449) Resp:  [16-17] 17 (11/30 0449) BP: (124-139)/(75-85) 126/79 (11/30 0449) SpO2:  [96 %-100 %] 99 % (11/30 0449) Last BM Date: 01/18/16 276 PO NG out drain 16  urine 2200 Afebrile, VSS WBC down to 8.2 Culture 01/15/16  ABUNDANT BACTEROIDES FRAGILIS BETA LACTAMASE POSITIVE FEW SACCHAROMYCES CEREVISIAE     Intake/Output from previous day: 11/29 0701 - 11/30 0700 In: 2762.7 [P.O.:276; I.V.:2436.7; IV Piggyback:50] Out: 2226 [Urine:2210; Drains:16] Intake/Output this shift: Total I/O In: -  Out: 910 [Urine:900; Drains:10]  General appearance: alert, cooperative and no distress Resp: clear to auscultation bilaterally GI: soft, sore, sites all look fine, she thinks her stomach is a little distended. I think it looks fine. + Flatus, no Bm.    Lab Results:   Recent Labs  01/18/16 0445 01/20/16 0452  WBC 14.4* 8.2  HGB 10.1* 9.6*  HCT 30.5* 29.2*  PLT 473* 478*    BMET  Recent Labs  01/18/16 0445  NA 138  K 4.3  CL 103  CO2 26  GLUCOSE 82  BUN 30*  CREATININE 0.89  CALCIUM 9.0   PT/INR No results for input(s): LABPROT, INR in the last 72 hours.  No results for input(s): AST, ALT, ALKPHOS, BILITOT, PROT, ALBUMIN in the last 168 hours.   Lipase     Component Value Date/Time   LIPASE 25 01/05/2016 1804     Studies/Results: No results found.  Medications: . anidulafungin  100 mg Intravenous Q24H  . enoxaparin (LOVENOX) injection  40 mg Subcutaneous QHS  . ertapenem  1 g Intravenous Q24H  . famotidine (PEPCID) IV  20 mg Intravenous Q12H  . lip balm  1 application  Topical BID  . sodium chloride flush  3 mL Intravenous Q12H   . dextrose 5 % and 0.9% NaCl 100 mL/hr at 01/20/16 0539   Assessment/Plan Perforated appendicitis with worsening intraabdominal interloop abscesses x3 S/p LAPAROSCOPIC LYSIS OF ADHESIONS X 75MIN (2/3 CASE) LAPAROSCOPIC DRAINAGE OF INTRA ABDOMINAL ABSCESSES X 3,  LAPAROSCOPIC APPENDECTOMY, 01/15/16, Dr. Johney Maine  POD 5 Ileus  Hypertension FEN: IV fluids/ clears ID: Admit 11/15-11/17 Ceftriaxone/Flagyl; 11/18-11/23 Zosyn; 11/23-11/25 diflucan, Anidulafungin111/26 =>>day 5; 11/23 =>>Ertapenem day 8 DVT: Lovenox  Plan:  I will leave her on clears for now she hasn't taken much in so far.  Continue to mobilize.  Give her more time.  Discuss when to stop antibiotics and antifungal.       LOS: 14 days    Tata Timmins 01/20/2016 [phone]

## 2016-01-21 ENCOUNTER — Other Ambulatory Visit: Payer: 59

## 2016-01-21 MED ORDER — CIPROFLOXACIN HCL 500 MG PO TABS
500.0000 mg | ORAL_TABLET | Freq: Two times a day (BID) | ORAL | Status: DC
  Administered 2016-01-21: 500 mg via ORAL
  Filled 2016-01-21: qty 1

## 2016-01-21 MED ORDER — ACETAMINOPHEN 325 MG PO TABS: ORAL_TABLET | ORAL | Status: DC

## 2016-01-21 MED ORDER — CIPROFLOXACIN HCL 500 MG PO TABS: 500.0000 mg | ORAL_TABLET | Freq: Two times a day (BID) | ORAL | 0 refills | Status: DC

## 2016-01-21 MED ORDER — OXYCODONE-ACETAMINOPHEN 5-325 MG PO TABS: 1.0000 | ORAL_TABLET | ORAL | Status: DC | PRN

## 2016-01-21 MED ORDER — METRONIDAZOLE 500 MG PO TABS
500.0000 mg | ORAL_TABLET | Freq: Three times a day (TID) | ORAL | 0 refills | Status: DC
Start: 2016-01-21 — End: 2016-04-28

## 2016-01-21 MED ORDER — SACCHAROMYCES BOULARDII 250 MG PO CAPS
250.0000 mg | ORAL_CAPSULE | Freq: Two times a day (BID) | ORAL | Status: DC
  Administered 2016-01-21: 250 mg via ORAL
  Filled 2016-01-21: qty 1

## 2016-01-21 MED ORDER — OXYCODONE-ACETAMINOPHEN 5-325 MG PO TABS: 1.0000 | ORAL_TABLET | ORAL | 0 refills | Status: DC | PRN

## 2016-01-21 MED ORDER — METRONIDAZOLE 500 MG PO TABS
500.0000 mg | ORAL_TABLET | Freq: Three times a day (TID) | ORAL | Status: DC
  Administered 2016-01-21: 500 mg via ORAL
  Filled 2016-01-21: qty 1

## 2016-01-21 MED ORDER — SACCHAROMYCES BOULARDII 250 MG PO CAPS: ORAL_CAPSULE | ORAL | Status: DC

## 2016-01-21 NOTE — Discharge Summary (Signed)
Physician Discharge Summary  Patient ID: Dana Hodge MRN: [id number] DOB/AGE: February 07, 1951 65 y.o.  Admit date: 01/05/2016 Discharge date: 01/21/2016  Admission Diagnoses:  Perforated appendicitis with worsening intraabdominal interloop abscesses x3 Hypertension  Discharge Diagnoses:   Perforated appendicitis with worsening intraabdominal interloop abscesses x3 Ileus  Hypertension  Principal Problem:   Acute appendicitis with perforation and peritoneal abscess s/p lap appendectomy 01/15/2016 Active Problems:   Abdominal abscesses x3 s/p lap LOA & drainage 01/15/2016   Hyperlipemia   Essential hypertension   Esophageal reflux   Hypokalemia   PROCEDURES: LAPAROSCOPIC LYSIS OF ADHESIONS X 75MIN (2/3 CASE), LAPAROSCOPIC DRAINAGE OF INTRA ABDOMINAL ABSCESSES X 3,  LAPAROSCOPIC APPENDECTOMY, 01/15/16,Dr. Johney Maine.  Hospital Course:  65 y.o. F with abdominal pain since yesterday.  She reports diffuse abdominal pain, nausea, vomiting and diarrhea yesterday that worsened in intensity today.  She presented to the ED this evening.  Reports subjective fevers as well.  She was admitted by Dr. Marcello Moores and placed on medical management for a perforated appendix.  She was placed on bowel rest, IV hydration and antibiotics. She was treated in this fashion for 10 days without significant improvement.After repeat CT scan on 01/15/16 Dr. Johney Maine recommended surgery.  WBC was still 15.9 at that point.  She tolerated the procedure well.  Post op her WBC went up to 32.6.  An Antifungal was added preop and new generation antifungal added on 01/16/16.  She had an ileus, but slowly opened up her fevers resolved, bowel function returned.  Her diet was advanced and by the AM of 01/21/16 she was ready for discharge.  Her WBC returned to normal on 12/3015, so we sent her home on 5 more days of antibiotics. Her drain was removed before discharge. Drainage was clear and serous.  CBC Latest Ref Rng & Units 01/20/2016  01/18/2016 01/17/2016  WBC 4.0 - 10.5 K/uL 8.2 14.4(H) 19.1(H)  Hemoglobin 12.0 - 15.0 g/dL 9.6(L) 10.1(L) 9.8(L)  Hematocrit 36.0 - 46.0 % 29.2(L) 30.5(L) 29.3(L)  Platelets 150 - 400 K/uL 478(H) 473(H) 450(H)   CMP Latest Ref Rng & Units 01/18/2016 01/17/2016 01/16/2016  Glucose 65 - 99 mg/dL 82 - 124(H)  BUN 6 - 20 mg/dL 30(H) - 13  Creatinine 0.44 - 1.00 mg/dL 0.89 0.84 0.83  Sodium 135 - 145 mmol/L 138 - 136  Potassium 3.5 - 5.1 mmol/L 4.3 4.3 5.0  Chloride 101 - 111 mmol/L 103 - 103  CO2 22 - 32 mmol/L 26 - 24  Calcium 8.9 - 10.3 mg/dL 9.0 - 8.9  Total Protein 6.5 - 8.1 g/dL - - -  Total Bilirubin 0.3 - 1.2 mg/dL - - -  Alkaline Phos 38 - 126 U/L - - -  AST 15 - 41 U/L - - -  ALT 14 - 54 U/L - - -    Condition on D/c:  Improved    Disposition: 01-Home or Self Care  Discharge Instructions    Call MD for:    Complete by:  As directed    Temperature > 101.37F   Call MD for:  extreme fatigue    Complete by:  As directed    Call MD for:  hives    Complete by:  As directed    Call MD for:  persistant nausea and vomiting    Complete by:  As directed    Call MD for:  redness, tenderness, or signs of infection (pain, swelling, redness, odor or green/yellow discharge around incision site)    Complete  by:  As directed    Call MD for:  severe uncontrolled pain    Complete by:  As directed    Diet - low sodium heart healthy    Complete by:  As directed    Start with bland, low residue diet for a few days, then advance to a heart healthy (low fat, high fiber) diet.  If you feel nauseated or constipated, simplify to a liquid only diet for 48 hours until you are feeling better (no more nausea, farting/passing gas, having a bowel movement, etc...).  If you cannot tolerate even drinking liquids, or feeling worse, let your surgeon know or go to the Emergency Department for help.   Discharge instructions    Complete by:  As directed    Please see discharge instruction sheets.   Also  refer to any handouts/printouts that may have been given from the [facility] surgery office (if you visited [place] there before surgery) Please call our office if you have any questions or concerns [phone]   Discharge wound care:    Complete by:  As directed    You have closed incisions: Shower and bathe over these incisions with soap and water every day.  It is OK to wash over the dressings: they are waterproof. Remove all surgical dressings on postoperative day #3.  You do not need to replace dressings over the closed incisions unless you feel more comfortable with a Band-Aid covering it.    Please call our office [phone] if you have further questions.   Driving Restrictions    Complete by:  As directed    No driving until off narcotics and can safely swerve away without pain during an emergency   Increase activity slowly    Complete by:  As directed    Lifting restrictions    Complete by:  As directed    Avoid heavy lifting initially, <20 pounds at first.   Do not push through pain.   You have no specific weight limit: If it hurts to do, DON'T DO IT.    If you feel no pain, you are not injuring anything.  Pain will protect you from injury.   Coughing and sneezing are far more stressful to your incision than any lifting.   Avoid resuming heavy lifting (>50 pounds) or other intense activity until off all narcotic pain medications.   When want to exercise more, give yourself 2 weeks to gradually get back to full intense exercise/activity.   May shower / Bathe    Complete by:  As directed    [place].  It is fine for dressings or wounds to be washed/rinsed.  Use gentle soap & water.  This will help the incisions and/or wounds get clean & minimize infection.   May walk up steps    Complete by:  As directed    Sexual Activity Restrictions    Complete by:  As directed    Sexual activity as tolerated.  Do not push through pain.  Pain will protect you from injury.   Walk with  assistance    Complete by:  As directed    Walk over an hour a day.  May use a walker/cane/companion to help with balance and stamina.       Medication List    STOP taking these medications   senna 8.6 MG Tabs tablet Commonly known as:  SENOKOT     TAKE these medications   acetaminophen 325 MG tablet Commonly known as:  TYLENOL This is in your prescribed pain pill.  Do not take more than 4000 mg of Tylenol/acetaminophen per day.   aspirin 81 MG tablet Take 81 mg by mouth daily.   ciprofloxacin 500 MG tablet Commonly known as:  CIPRO Take 1 tablet (500 mg total) by mouth 2 (two) times daily.   hydrochlorothiazide 25 MG tablet Commonly known as:  HYDRODIURIL TAKE ONE TABLET BY MOUTH ONCE DAILY   lisinopril 10 MG tablet Commonly known as:  PRINIVIL,ZESTRIL TAKE ONE TABLET BY MOUTH ONCE DAILY   MAGNESIUM PO Take 250 mg by mouth 2 (two) times daily.   metroNIDAZOLE 500 MG tablet Commonly known as:  FLAGYL Take 1 tablet (500 mg total) by mouth every 8 (eight) hours.   oxyCODONE-acetaminophen 5-325 MG tablet Commonly known as:  PERCOCET/ROXICET Take 1-2 tablets by mouth every 4 (four) hours as needed for moderate pain.   saccharomyces boulardii 250 MG capsule Commonly known as:  FLORASTOR You can buy this at any drug store, use for the next month.  Follow package instructions      Follow-up Information    Colin Benton R., DO Follow up.   Specialty:  Family Medicine Why:  Call for follow up of medical issues and blood pressure. Contact information: [place] [address] [phone]        Adin Hector., MD Follow up.   Specialty:  General Surgery Why:  call and make an appointment for 2 weeks. Contact information: [address] [place] [address] [phone]           Signed: Earnstine Regal 01/21/2016, 2:39 PM

## 2016-01-21 NOTE — Progress Notes (Signed)
6 Days Post-Op  Subjective: She looks great, will advance diet and switch her over to oral pain meds, oral antibiotics and if she does well home later today.    Objective: Vital signs in last 24 hours: Temp:  [98.2 F (36.8 C)-98.9 F (37.2 C)] 98.7 F (37.1 C) (12/01 0550) Pulse Rate:  [82-91] 82 (12/01 0550) Resp:  [16-17] 16 (12/01 0550) BP: (143-151)/(82-90) 151/90 (12/01 0550) SpO2:  [99 %-100 %] 100 % (12/01 0550) Last BM Date: 01/20/16 (small) 320 PO 2350 urine Drain 15  urine 4050 Afebrile, VSS NO labs this AM  Intake/Output from previous day: 11/30 0701 - 12/01 0700 In: 2820 [P.O.:320; I.V.:2350; IV Piggyback:150] Out: [id number] [Urine:4050; Drains:15] Intake/Output this shift: No intake/output data recorded.  General appearance: alert, cooperative and no distress Resp: clear to auscultation bilaterally GI: soft sore, sites OK drain is clear, + Bm reported by pt.    Lab Results:   Recent Labs  01/20/16 0452  WBC 8.2  HGB 9.6*  HCT 29.2*  PLT 478*    BMET No results for input(s): NA, K, CL, CO2, GLUCOSE, BUN, CREATININE, CALCIUM in the last 72 hours. PT/INR No results for input(s): LABPROT, INR in the last 72 hours.  No results for input(s): AST, ALT, ALKPHOS, BILITOT, PROT, ALBUMIN in the last 168 hours.   Lipase     Component Value Date/Time   LIPASE 25 01/05/2016 1804     Studies/Results: No results found.  Medications: . enoxaparin (LOVENOX) injection  40 mg Subcutaneous QHS  . lip balm  1 application Topical BID  . sodium chloride flush  3 mL Intravenous Q12H    Assessment/Plan Perforated appendicitis with worsening intraabdominal interloop abscesses x3 S/p LAPAROSCOPIC LYSIS OF ADHESIONS X 75MIN (2/3 CASE) LAPAROSCOPIC DRAINAGE OF INTRA ABDOMINAL ABSCESSES X 3,  LAPAROSCOPIC APPENDECTOMY, 01/15/16, Dr. Johney Maine  POD 6 Ileus  Hypertension FEN: IV fluids/ fulls ID: Admit 11/15-11/17 Ceftriaxone/Flagyl; 11/18-11/23 Zosyn; 11/23-11/25  diflucan, Anidulafungin111/26 =>>day 5; 11/23 =>>Ertapenem d/ced 01/21/16. DVT: Lovenox   Plan:  Advance diet, saline lock IV, PO abx, and if OK home later today.  LOS: 15 days    Alvan Culpepper 01/21/2016 [phone]

## 2016-01-21 NOTE — Discharge Instructions (Signed)
LAPAROSCOPIC SURGERY: POST OP INSTRUCTIONS  ######################################################################  EAT Gradually transition to a high fiber diet with a fiber supplement over the next few weeks after discharge.  Start with a pureed / full liquid diet (see below)  WALK Walk an hour a day.  Control your pain to do that.    CONTROL PAIN Control pain so that you can walk, sleep, tolerate sneezing/coughing, go up/down stairs.  HAVE A BOWEL MOVEMENT DAILY Keep your bowels regular to avoid problems.  OK to try a laxative to override constipation.  OK to use an antidairrheal to slow down diarrhea.  Call if not better after 2 tries  CALL IF YOU HAVE PROBLEMS/CONCERNS Call if you are still struggling despite following these instructions. Call if you have concerns not answered by these instructions  ######################################################################    1. DIET: Follow a light bland diet the first 24 hours after arrival home, such as soup, liquids, crackers, etc.  Be sure to include lots of fluids daily.  Avoid fast food or heavy meals as your are more likely to get nauseated.  Eat a low fat the next few days after surgery.   2. Take your usually prescribed home medications unless otherwise directed. 3. PAIN CONTROL: a. Pain is best controlled by a usual combination of three different methods TOGETHER: i. Ice/Heat ii. Over the counter pain medication iii. Prescription pain medication b. Most patients will experience some swelling and bruising around the incisions.  Ice packs or heating pads (30-60 minutes up to 6 times a day) will help. Use ice for the first few days to help decrease swelling and bruising, then switch to heat to help relax tight/sore spots and speed recovery.  Some people prefer to use ice alone, heat alone, alternating between ice & heat.  Experiment to what works for you.  Swelling and bruising can take several weeks to resolve.   c. It is  helpful to take an over-the-counter pain medication regularly for the first few weeks.  Choose one of the following that works best for you: i. Naproxen (Aleve, etc)  Two 251m tabs twice a day ii. Ibuprofen (Advil, etc) Three 2053mtabs four times a day (every meal & bedtime) iii. Acetaminophen (Tylenol, etc) 500-65044mour times a day (every meal & bedtime) d. A  prescription for pain medication (such as oxycodone, hydrocodone, etc) should be given to you upon discharge.  Take your pain medication as prescribed.  i. If you are having problems/concerns with the prescription medicine (does not control pain, nausea, vomiting, rash, itching, etc), please call us [phone] see if we need to switch you to a different pain medicine that will work better for you and/or control your side effect better. ii. If you need a refill on your pain medication, please contact your pharmacy.  They will contact our office to request authorization. Prescriptions will not be filled after 5 pm or on week-ends. 4. Avoid getting constipated.  Between the surgery and the pain medications, it is common to experience some constipation.  Increasing fluid intake and taking a fiber supplement (such as Metamucil, Citrucel, FiberCon, MiraLax, etc) 1-2 times a day regularly will usually help prevent this problem from occurring.  A mild laxative (prune juice, Milk of Magnesia, MiraLax, etc) should be taken according to package directions if there are no bowel movements after 48 hours.   5. Watch out for diarrhea.  If you have many loose bowel movements, simplify your diet to bland foods & liquids for  a few days.  Stop any stool softeners and decrease your fiber supplement.  Switching to mild anti-diarrheal medications (Kayopectate, Pepto Bismol) can help.  If this worsens or does not improve, please call us. 6. Wash / shower every day.  You may shower over the dressings as they are waterproof.  Continue to shower over incision(s)  after the dressing is off. 7. Remove your waterproof bandages 5 days after surgery.  You may leave the incision open to air.  You may replace a dressing/Band-Aid to cover the incision for comfort if you wish.  8. ACTIVITIES as tolerated:   a. You may resume regular (light) daily activities beginning the next day--such as daily self-care, walking, climbing stairs--gradually increasing activities as tolerated.  If you can walk 30 minutes without difficulty, it is safe to try more intense activity such as jogging, treadmill, bicycling, low-impact aerobics, swimming, etc. b. Save the most intensive and strenuous activity for last such as sit-ups, heavy lifting, contact sports, etc  Refrain from any heavy lifting or straining until you are off narcotics for pain control.   c. DO NOT PUSH THROUGH PAIN.  Let pain be your guide: If it hurts to do something, don't do it.  Pain is your body warning you to avoid that activity for another week until the pain goes down. d. You may drive when you are no longer taking prescription pain medication, you can comfortably wear a seatbelt, and you can safely maneuver your car and apply brakes. e. Dennis Bast may have sexual intercourse when it is comfortable.  9. FOLLOW UP in our office a. Please call [facility] at [phone] to set up an appointment to see your surgeon in the office for a follow-up appointment approximately 2-3 weeks after your surgery. b. Make sure that you call for this appointment the day you arrive home to insure a convenient appointment time. 10. IF YOU HAVE DISABILITY OR FAMILY LEAVE FORMS, BRING THEM TO THE OFFICE FOR PROCESSING.  DO NOT GIVE THEM TO YOUR DOCTOR.   WHEN TO CALL us [phone]: 1. Poor pain control 2. Reactions / problems with new medications (rash/itching, nausea, etc)  3. Fever over 101.5 F (38.5 C) 4. Inability to urinate 5. Nausea and/or vomiting 6. Worsening swelling or bruising 7. Continued bleeding from incision. 8. Increased  pain, redness, or drainage from the incision   The clinic staff is available to answer your questions during regular business hours (8:30am-5pm).  Please dont hesitate to call and ask to speak to one of our nurses for clinical concerns.   If you have a medical emergency, go to the nearest emergency room or call 911.  A surgeon from [facility] is always on call at the [facility], [place], [place], [place], [place]  [address] ? MAIN: [phone] ? TOLL FREE: [phone] ?  FAX [phone] [email or web address]   Soft-Food Meal Plan for the next week A soft-food meal plan includes foods that are safe and easy to swallow. This meal plan typically is used:  If you are having trouble chewing or swallowing foods.  As a transition meal plan after only having had liquid meals for a long period. What do I need to know about the soft-food meal plan? A soft-food meal plan includes tender foods that are soft and easy to chew and swallow. In most cases, bite-sized pieces of food are easier to swallow. A bite-sized piece is about  inch or  smaller. Foods in this plan do not need to be ground or pureed. Foods that are very hard, crunchy, or sticky should be avoided. Also, breads, cereals, yogurts, and desserts with nuts, seeds, or fruits should be avoided. What foods can I eat? Grains  Rice and wild rice. Moist bread, dressing, pasta, and noodles. Well-moistened dry or cooked cereals, such as farina (cooked wheat cereal), oatmeal, or grits. Biscuits, breads, muffins, pancakes, and waffles that have been well moistened. Vegetables  Shredded lettuce. Cooked, tender vegetables, including potatoes without skins. Vegetable juices. Broths or creamed soups made with vegetables that are not stringy or chewy. Strained tomatoes (without seeds). Fruits  Canned or well-cooked fruits. Soft (ripe), peeled fresh fruits, such as peaches,  nectarines, kiwi, cantaloupe, honeydew melon, and watermelon (without seeds). Soft berries with small seeds, such as strawberries. Fruit juices (without pulp). Meats and Other Protein Sources  Moist, tender, lean beef. Mutton. Lamb. Veal. Chicken. [place]. Liver. Ham. Fish without bones. Eggs. Dairy  Milk, milk drinks, and cream. Plain cream cheese and cottage cheese. Plain yogurt. Sweets/Desserts  Flavored gelatin desserts. Custard. Plain ice cream, frozen yogurt, sherbet, milk shakes, and malts. Plain cakes and cookies. Plain hard candy. Other  Butter, margarine (without trans fat), and cooking oils. Mayonnaise. Cream sauces. Mild spices, salt, and sugar. Syrup, molasses, honey, and jelly. The items listed above may not be a complete list of recommended foods or beverages. Contact your dietitian for more options.  What foods are not recommended? Grains  Dry bread, toast, crackers that have not been moistened. Coarse or dry cereals, such as bran, granola, and shredded wheat. Tough or chewy crusty breads, such as [place] bread or baguettes. Vegetables  Corn. Raw vegetables except shredded lettuce. Cooked vegetables that are tough or stringy. Tough, crisp, fried potatoes and potato skins. Fruits  Fresh fruits with skins or seeds or both, such as apples, pears, or grapes. Stringy, high-pulp fruits, such as papaya, pineapple, coconut, or mango. Fruit leather, fruit roll-ups, and all dried fruits. Meats and Other Protein Sources  Sausages and hot dogs. Meats with gristle. Fish with bones. Nuts, seeds, and chunky peanut or other nut butters. Sweets/Desserts  Cakes or cookies that are very dry or chewy. The items listed above may not be a complete list of foods and beverages to avoid. Contact your dietitian for more information.  This information is not intended to replace advice given to you by your health care provider. Make sure you discuss any questions you have with your health care  provider. Document Released: 05/16/2007 Document Revised: 07/15/2015 Document Reviewed: 01/03/2013 Elsevier Interactive Patient Education  2017 [employer].

## 2016-03-20 ENCOUNTER — Other Ambulatory Visit: Payer: Self-pay | Admitting: Family Medicine

## 2016-04-28 ENCOUNTER — Ambulatory Visit (INDEPENDENT_AMBULATORY_CARE_PROVIDER_SITE_OTHER): Payer: 59 | Admitting: Family Medicine

## 2016-04-28 ENCOUNTER — Other Ambulatory Visit (HOSPITAL_COMMUNITY)
Admission: RE | Admit: 2016-04-28 | Discharge: 2016-04-28 | Disposition: A | Discharge status: Home / Self Care | Payer: 59 | Source: Ambulatory Visit | Attending: Family Medicine | Admitting: Family Medicine

## 2016-04-28 ENCOUNTER — Encounter: Payer: Self-pay | Admitting: Family Medicine

## 2016-04-28 VITALS — BP 120/78 | HR 92 | Temp 98.5°F | Ht 64.75 in | Wt 154.8 lb

## 2016-04-28 DIAGNOSIS — R202 Paresthesia of skin: Secondary | ICD-10-CM | POA: Diagnosis not present

## 2016-04-28 DIAGNOSIS — Z0000 Encounter for general adult medical examination without abnormal findings: Secondary | ICD-10-CM

## 2016-04-28 DIAGNOSIS — Z01419 Encounter for gynecological examination (general) (routine) without abnormal findings: Secondary | ICD-10-CM | POA: Diagnosis present

## 2016-04-28 DIAGNOSIS — R739 Hyperglycemia, unspecified: Secondary | ICD-10-CM | POA: Diagnosis not present

## 2016-04-28 DIAGNOSIS — I10 Essential (primary) hypertension: Secondary | ICD-10-CM | POA: Diagnosis not present

## 2016-04-28 DIAGNOSIS — Z1151 Encounter for screening for human papillomavirus (HPV): Secondary | ICD-10-CM | POA: Diagnosis present

## 2016-04-28 DIAGNOSIS — E785 Hyperlipidemia, unspecified: Secondary | ICD-10-CM

## 2016-04-28 DIAGNOSIS — Z124 Encounter for screening for malignant neoplasm of cervix: Secondary | ICD-10-CM

## 2016-04-28 DIAGNOSIS — Z7289 Other problems related to lifestyle: Secondary | ICD-10-CM | POA: Diagnosis not present

## 2016-04-28 DIAGNOSIS — D649 Anemia, unspecified: Secondary | ICD-10-CM

## 2016-04-28 LAB — CBC WITH DIFFERENTIAL/PLATELET
BASOS ABS: 0 {cells}/uL (ref 0–200)
Basophils Relative: 0 %
EOS ABS: 82 {cells}/uL (ref 15–500)
Eosinophils Relative: 1 %
HEMATOCRIT: 40.5 % (ref 35.0–45.0)
HEMOGLOBIN: 13.4 g/dL (ref 11.7–15.5)
Lymphocytes Relative: 43 %
Lymphs Abs: 3526 cells/uL (ref 850–3900)
MCH: 29.3 pg (ref 27.0–33.0)
MCHC: 33.1 g/dL (ref 32.0–36.0)
MCV: 88.6 fL (ref 80.0–100.0)
MONO ABS: 492 {cells}/uL (ref 200–950)
MPV: 10.9 fL (ref 7.5–12.5)
Monocytes Relative: 6 %
NEUTROS PCT: 50 %
Neutro Abs: 4100 cells/uL (ref 1500–7800)
Platelets: 309 10*3/uL (ref 140–400)
RBC: 4.57 MIL/uL (ref 3.80–5.10)
RDW: 14.3 % (ref 11.0–15.0)
WBC: 8.2 10*3/uL (ref 3.8–10.8)

## 2016-04-28 LAB — BASIC METABOLIC PANEL
BUN: 20 mg/dL (ref 7–25)
CO2: 26 mmol/L (ref 20–31)
Calcium: 9.6 mg/dL (ref 8.6–10.4)
Chloride: 103 mmol/L (ref 98–110)
Creat: 1.07 mg/dL — ABNORMAL HIGH (ref 0.50–0.99)
GLUCOSE: 94 mg/dL (ref 65–99)
POTASSIUM: 4.5 mmol/L (ref 3.5–5.3)
Sodium: 138 mmol/L (ref 135–146)

## 2016-04-28 LAB — VITAMIN B12: Vitamin B-12: 599 pg/mL (ref 200–1100)

## 2016-04-28 LAB — HDL CHOLESTEROL: HDL: 64 mg/dL (ref >50)

## 2016-04-28 LAB — CHOLESTEROL, TOTAL: CHOLESTEROL: 298 mg/dL — AB (ref <200)

## 2016-04-28 LAB — FERRITIN: FERRITIN: 24 ng/mL (ref 20–288)

## 2016-04-28 NOTE — Addendum Note (Signed)
Addended by: Agnes Lawrence on: 04/28/2016 03:38 PM   Modules accepted: Orders

## 2016-04-28 NOTE — Progress Notes (Signed)
Pre visit review using our clinic review tool, if applicable. No additional management support is needed unless otherwise documented below in the visit note. 

## 2016-04-28 NOTE — Patient Instructions (Signed)
BEFORE YOU LEAVE: -labs -follow up: 3-4 months  Vit D3 1000 IU daily.  Get your mammogram in April 2018.  We have ordered labs and a pap smear at this visit. It can take up to 1-2 weeks for results and processing. IF results require follow up or explanation, we will call you with instructions. Clinically stable results will be released to your [facility]. If you have not heard from [place] or cannot find your results in [facility] in 2 weeks please contact our office at [phone].  If you are not yet signed up for [facility], please consider signing up.   We recommend the following healthy lifestyle for LIFE: 1) Small portions.   Tip: eat off of a salad plate instead of a dinner plate.  Tip: It is ok to feel hungry after a meal - that likely means you ate an appropriate portion.  Tip: if you need more or a snack choose fruits, veggies and/or a handful of nuts or seeds.  2) Eat a healthy clean diet.  * Tip: Avoid (less then 1 serving per week): processed foods, sweets, sweetened drinks, white starches (rice, flour, bread, potatoes, pasta, etc), red meat, fast foods, butter  *Tip: CHOOSE instead   * 5-9 servings per day of fresh or frozen fruits and vegetables (but not corn, potatoes, bananas, canned or dried fruit)   *nuts and seeds, beans   *olives and olive oil   *small portions of lean meats such as fish and white chicken    *small portions of whole grains  3)Get at least 150 minutes of sweaty aerobic exercise per week.  4)Reduce stress - consider counseling, meditation and relaxation to balance other aspects of your life.    WE NOW OFFER   [facility] FAST TRACK!!!  SAME DAY Appointments for ACUTE CARE  Such as: Sprains, Injuries, cuts, abrasions, rashes, muscle pain, joint pain, back pain Colds, flu, sore throats, headache, allergies, cough, fever  Ear pain, sinus and eye infections Abdominal pain, nausea, vomiting, diarrhea, upset stomach Animal/insect bites  3 Easy  Ways to Schedule: Walk-In Scheduling Call in scheduling Mychart Sign-up: [email or web address]

## 2016-04-28 NOTE — Addendum Note (Signed)
Addended by: Elmer Picker on: 04/28/2016 04:01 PM   Modules accepted: Orders

## 2016-04-28 NOTE — Progress Notes (Signed)
HPI:  Here for CPE:  -Concerns and/or follow up today:  Due for pap, repeat mammo 6 months, labs, hep c screening. Occ brief tingling in all finger tips only. Types a lot. No HA, weakness, pain, nubness or any other symptoms.  S/p appendectomy for perforated appendicitis 12/2015  Anemia: -during recent hospitalization and mild on cbc just prior  Hypertension: -take hxtz, lisinpril   Hyperlipidemia/BMI 27: -quit her statin and refused to take after her own google research - thinks statin caused GERD and insomnia -aware of CV risks, knows should do better  -Diet: more fruit but admits diet is terrible  -Exercise: no regular exercise  -Taking folic acid, vitamin D or calcium: no  -Vaccines: UTD  -pap history: due for pap, denies hx abnormal  -FDLMP:n/a  -sexual activity: yes, female partner, no new partners  -wants STI testing (Hep C if born [birth year]): no  -FH breast, colon or ovarian ca: see FH Last mammogram: 11/2015 - 6 month repeat advised for abnormalitis L beast Last colon cancer screening: 2011, 10 year repeat advised DEXA (>/= 65):   -Alcohol, Tobacco, drug use: see social history  Review of Systems - no fevers, unintentional weight loss, vision loss, hearing loss, chest pain, sob, hemoptysis, melena, hematochezia, hematuria, genital discharge, changing or concerning skin lesions, bleeding, bruising, loc, thoughts of self harm or SI  Past Medical History:  Diagnosis Date  . Chicken pox   . Colon polyp   . Hyperlipidemia   . Hypertension   . UTI (urinary tract infection)     Past Surgical History:  Procedure Laterality Date  . LAPAROSCOPIC APPENDECTOMY N/A 01/15/2016   Procedure: APPENDECTOMY LAPAROSCOPIC. DRAINAGE OF INTRA ABDOMINAL ABSCESSES X 3, LYSIS OF ADHESIONS;  Surgeon: Michael Boston, MD;  Location: [facility];  Service: General;  Laterality: N/A;    Family History  Problem Relation Age of Onset  . Heart disease Mother 59    CAD  . Hypertension  Mother   . Heart disease Father 78    MI  . COPD Father   . Cancer Maternal Uncle     colon, pancreatic  . Cancer Maternal Grandfather     colon, pancreatic    Social History   Social History  . Marital status: Married    Spouse name: N/A  . Number of children: N/A  . Years of education: N/A   Social History Main Topics  . Smoking status: Never Smoker  . Smokeless tobacco: Never Used  . Alcohol use Yes     Comment: a glass of wine with dinner   . Drug use: No  . Sexual activity: Yes    Birth control/ protection: None   Other Topics Concern  . None   Social History Narrative   Work or School: works as Water quality scientist, office work      Home Situation: lives with husband and handicapped adult son      Spiritual Beliefs: Christian      Lifestyle: going to the gym 3-4 days per week, working on diet - portion sizes are an issue              Current Outpatient Prescriptions:  .  aspirin 81 MG tablet, Take 81 mg by mouth daily., Disp: , Rfl:  .  lisinopril (PRINIVIL,ZESTRIL) 10 MG tablet, TAKE ONE TABLET BY MOUTH ONCE DAILY, Disp: 30 tablet, Rfl: 5 .  MAGNESIUM PO, Take 250 mg by mouth 2 (two) times daily. , Disp: , Rfl:   EXAM:  Vitals:   04/28/16 1459  BP: 120/78  Pulse: 92  Temp: 98.5 F (36.9 C)    GENERAL: vitals reviewed and listed below, alert, oriented, appears well hydrated and in no acute distress  HEENT: head atraumatic, PERRLA, normal appearance of eyes, ears, nose and mouth. moist mucus membranes.  NECK: supple, no masses or lymphadenopathy  LUNGS: clear to auscultation bilaterally, no rales, rhonchi or wheeze  CV: HRRR, no peripheral edema or cyanosis, normal pedal pulses  ABDOMEN: bowel sounds normal, soft, non tender to palpation, no masses, no rebound or guarding  BREAST: normal appearance - no skin lesions or discharge noted on inspection of both breasts, on palpation of both breast and axillary region no suspicious lesions appreciated  today  GU: normal appearance of external genitalia - no lesions or masses appreciated, normal appearing vaginal mucosa - no abnormal discharge, normal appearance of cervix - no lesions or abnormal discharge observed. Pap obtained.  RECTAL: deferred  SKIN: no rash or abnormal lesions  MS: normal gait, moves all extremities normally  NEURO: normal gait, speech and thought processing grossly intact, muscle tone grossly intact throughout  PSYCH: normal affect, pleasant and cooperative  ASSESSMENT AND PLAN:  Discussed the following assessment and plan:  Encounter for preventive health examination  Hyperlipidemia, unspecified hyperlipidemia type - Plan: Cholesterol, total, HDL cholesterol  Essential hypertension - Plan: Basic metabolic panel, CBC  Anemia, unspecified type  Paresthesia - Plan: TSH, Ferritin, Vitamin B12 -we discussed possible serious and likely etiologies, workup and treatment, treatment risks and return precautions -after this discussion, Dana Hodge opted for labs, careful posture with repetitive work -follow up advised recurrent, worsening, new symptoms -of course, we advised Dana Hodge  to return or notify a doctor immediately if symptoms worsen or persist or new concerns arise.  Hyperglycemia - Plan: Hemoglobin A1c  hep c screen - Plan: Hepatitis C antibody  -Discussed and advised all [place] preventive services health task force level A and B recommendations for age, sex and risks.  -Advised at least 150 minutes of exercise per week and a healthy diet with avoidance of (less then 1 serving per week) processed foods, white starches, red meat, fast foods and sweets and consisting of: * 5-9 servings of fresh fruits and vegetables (not corn or potatoes) *nuts and seeds, beans *olives and olive oil *lean meats such as fish and white chicken  *whole grains  -non fasting labs, pap, studies and vaccines per orders this encounter  Orders Placed This Encounter  Procedures  .  Basic metabolic panel  . CBC  . Cholesterol, total  . HDL cholesterol  . Hemoglobin A1c  . Hepatitis C antibody  . TSH  . Ferritin  . Vitamin B12    Patient advised to return to clinic immediately if symptoms worsen or persist or new concerns.  Patient Instructions  BEFORE YOU LEAVE: -labs -follow up: 3-4 months  Vit D3 1000 IU daily.  Get your mammogram in April 2018.  We have ordered labs and a pap smear at this visit. It can take up to 1-2 weeks for results and processing. IF results require follow up or explanation, we will call you with instructions. Clinically stable results will be released to your [facility]. If you have not heard from [place] or cannot find your results in [facility] in 2 weeks please contact our office at [phone].  If you are not yet signed up for [facility], please consider signing up.   We recommend the following healthy lifestyle for LIFE: 1)  Small portions.   Tip: eat off of a salad plate instead of a dinner plate.  Tip: It is ok to feel hungry after a meal - that likely means you ate an appropriate portion.  Tip: if you need more or a snack choose fruits, veggies and/or a handful of nuts or seeds.  2) Eat a healthy clean diet.  * Tip: Avoid (less then 1 serving per week): processed foods, sweets, sweetened drinks, white starches (rice, flour, bread, potatoes, pasta, etc), red meat, fast foods, butter  *Tip: CHOOSE instead   * 5-9 servings per day of fresh or frozen fruits and vegetables (but not corn, potatoes, bananas, canned or dried fruit)   *nuts and seeds, beans   *olives and olive oil   *small portions of lean meats such as fish and white chicken    *small portions of whole grains  3)Get at least 150 minutes of sweaty aerobic exercise per week.  4)Reduce stress - consider counseling, meditation and relaxation to balance other aspects of your life.    WE NOW OFFER   [facility] FAST TRACK!!!  SAME DAY Appointments for ACUTE  CARE  Such as: Sprains, Injuries, cuts, abrasions, rashes, muscle pain, joint pain, back pain Colds, flu, sore throats, headache, allergies, cough, fever  Ear pain, sinus and eye infections Abdominal pain, nausea, vomiting, diarrhea, upset stomach Animal/insect bites  3 Easy Ways to Schedule: Walk-In Scheduling Call in scheduling Mychart Sign-up: [email or web address]              No Follow-up on file.  Colin Benton R., DO

## 2016-04-29 LAB — TSH: TSH: 1.08 mIU/L

## 2016-04-29 LAB — HEPATITIS C ANTIBODY: HCV AB: NEGATIVE

## 2016-04-29 LAB — HEMOGLOBIN A1C
Hgb A1c MFr Bld: 5.2 % (ref <5.7)
Mean Plasma Glucose: 103 mg/dL

## 2016-05-01 MED ORDER — ROSUVASTATIN CALCIUM 20 MG PO TABS: 20.0000 mg | ORAL_TABLET | Freq: Every day | ORAL | 1 refills | Status: DC

## 2016-05-01 NOTE — Addendum Note (Signed)
Addended by: Agnes Lawrence on: 05/01/2016 12:23 PM   Modules accepted: Orders

## 2016-05-03 LAB — CYTOLOGY - PAP
Diagnosis: NEGATIVE
HPV (WINDOPATH): NOT DETECTED

## 2016-05-15 ENCOUNTER — Telehealth: Payer: Self-pay | Admitting: Family Medicine

## 2016-05-15 NOTE — Telephone Encounter (Signed)
° ° ° °  Pt would like a call back concerning her bp medicine and her crestor

## 2016-05-15 NOTE — Telephone Encounter (Signed)
I left a message for the pt to return my call. 

## 2016-05-30 NOTE — Telephone Encounter (Signed)
I called the pt and she stated she is concerned after her last test results when I told her she had kidney function abnormalities that were better than in the past as she did not know she had this before?  She stated she is concerned about taking the BP med as she and her husband read online that any medication ending in "il" was bad for your kidneys and she stopped this after reading this.  Stated someone else told her this also and she was told magnesium was good for you but our office advised she stop taking this?  Stated she is out of the medication and did not start the cholesterol medication either as she is not sure what to do?  Message sent to Dr Maudie Mercury and the pt is aware this may be addressed on Thursday when Dr Maudie Mercury returns to the office.

## 2016-05-31 NOTE — Telephone Encounter (Signed)
Please set up visit so we can review her labs, meds, BP and concerns with her. Thanks.

## 2016-05-31 NOTE — Telephone Encounter (Signed)
Left message to advise patient

## 2016-05-31 NOTE — Telephone Encounter (Signed)
Patient reports that she has stopped taking lisinopril on her own and feels better. She wants to know if you can prescribe something else for her blood pressure without her coming in- that is not an ACE inhibitor, she feels that these medications are causing kidney problems. Advised patient that lisinopril is  kidney protection. Patient has appointment scheduled in July.

## 2016-06-01 NOTE — Telephone Encounter (Signed)
Reviewed with patient; scheduled appt for 4/17/118 at 4pm

## 2016-06-01 NOTE — Telephone Encounter (Signed)
I really do think a complete change in medication would need an appt to discuss. There are many options for BP.Thanks for letting her know that the acei are usually protective. Thanks.

## 2016-06-06 ENCOUNTER — Encounter: Payer: Self-pay | Admitting: Family Medicine

## 2016-06-06 ENCOUNTER — Ambulatory Visit (INDEPENDENT_AMBULATORY_CARE_PROVIDER_SITE_OTHER): Payer: 59 | Admitting: Family Medicine

## 2016-06-06 VITALS — BP 142/90 | HR 77 | Temp 98.7°F | Ht 64.75 in | Wt 160.0 lb

## 2016-06-06 DIAGNOSIS — F40232 Fear of other medical care: Secondary | ICD-10-CM

## 2016-06-06 DIAGNOSIS — I10 Essential (primary) hypertension: Secondary | ICD-10-CM | POA: Diagnosis not present

## 2016-06-06 DIAGNOSIS — Z8249 Family history of ischemic heart disease and other diseases of the circulatory system: Secondary | ICD-10-CM

## 2016-06-06 DIAGNOSIS — E785 Hyperlipidemia, unspecified: Secondary | ICD-10-CM | POA: Diagnosis not present

## 2016-06-06 MED ORDER — ROSUVASTATIN CALCIUM 20 MG PO TABS: 20.0000 mg | ORAL_TABLET | Freq: Every day | ORAL | 1 refills | Status: DC

## 2016-06-06 MED ORDER — LISINOPRIL 10 MG PO TABS: 10.0000 mg | ORAL_TABLET | Freq: Every day | ORAL | 3 refills | Status: DC

## 2016-06-06 NOTE — Progress Notes (Signed)
Pre visit review using our clinic review tool, if applicable. No additional management support is needed unless otherwise documented below in the visit note. 

## 2016-06-06 NOTE — Progress Notes (Signed)
HPI:  Follow up HTN/HLD/Fam Hx CAD: -her husband read on social media and the Internet about kidney side effects with acei and statins and does not want her to take them -she struggles with lifestyle changes and feels she needs to take medications but wants to discuss options -she has not taken her acei or statin in about 1 week -she felt much better on the acei -no CP, SOB, DOE, swelling  ROS: See pertinent positives and negatives per HPI.  Past Medical History:  Diagnosis Date  . Chicken pox   . Colon polyp   . Hyperlipidemia   . Hypertension   . UTI (urinary tract infection)     Past Surgical History:  Procedure Laterality Date  . LAPAROSCOPIC APPENDECTOMY N/A 01/15/2016   Procedure: APPENDECTOMY LAPAROSCOPIC. DRAINAGE OF INTRA ABDOMINAL ABSCESSES X 3, LYSIS OF ADHESIONS;  Surgeon: Michael Boston, MD;  Location: [facility];  Service: General;  Laterality: N/A;    Family History  Problem Relation Age of Onset  . Heart disease Mother 53    CAD  . Hypertension Mother   . Heart disease Father 110    MI  . COPD Father   . Cancer Maternal Uncle     colon, pancreatic  . Cancer Maternal Grandfather     colon, pancreatic    Social History   Social History  . Marital status: Married    Spouse name: N/A  . Number of children: N/A  . Years of education: N/A   Social History Main Topics  . Smoking status: Never Smoker  . Smokeless tobacco: Never Used  . Alcohol use Yes     Comment: a glass of wine with dinner   . Drug use: No  . Sexual activity: Yes    Birth control/ protection: None   Other Topics Concern  . None   Social History Narrative   Work or School: works as Water quality scientist, office work      Home Situation: lives with husband and handicapped adult son      Spiritual Beliefs: Christian      Lifestyle: going to the gym 3-4 days per week, working on diet - portion sizes are an issue              Current Outpatient Prescriptions:  .  aspirin 81 MG  tablet, Take 81 mg by mouth daily., Disp: , Rfl:  .  MAGNESIUM PO, Take 250 mg by mouth 2 (two) times daily. , Disp: , Rfl:  .  rosuvastatin (CRESTOR) 20 MG tablet, Take 1 tablet (20 mg total) by mouth daily., Disp: 90 tablet, Rfl: 1 .  lisinopril (PRINIVIL,ZESTRIL) 10 MG tablet, Take 1 tablet (10 mg total) by mouth daily., Disp: 90 tablet, Rfl: 3  EXAM:  Vitals:   06/06/16 1537  BP: (!) 142/90  Pulse: 77  Temp: 98.7 F (37.1 C)    Body mass index is 26.83 kg/m.  GENERAL: vitals reviewed and listed above, alert, oriented, appears well hydrated and in no acute distress  HEENT: atraumatic, conjunttiva clear, no obvious abnormalities on inspection of external nose and ears  NECK: no obvious masses on inspection  LUNGS: clear to auscultation bilaterally, no wheezes, rales or rhonchi, good air movement  CV: HRRR, no peripheral edema  MS: moves all extremities without noticeable abnormality  PSYCH: pleasant and cooperative, no obvious depression or anxiety  ASSESSMENT AND PLAN:  Discussed the following assessment and plan: More than 50% of over 25  minutes spent in total in  caring for this patient was spent face-to-face with the patient, counseling and/or coordinating care.   Essential hypertension  Hyperlipidemia, unspecified hyperlipidemia type  FH: heart disease  Anxiety about home medications  -discussed at length risks/benefits various options for the treatment of HTN, HLD and for lowering CV risks -lifestyle recs again advised, particularly given her anxiety regarding medications, advised Mediterranean diet and regular aerobic exercise, she admits she has difficulty sticking with a healthy lifestyle -she opted to continue with lisinopril as she feels she felt better on it and did tolerate it well and is fearful of trying other options -she wants to give the current statin and dose a try -advise prompt communication with [place] if any concerns -Patient advised to return  or notify a doctor immediately if symptoms worsen or persist or new concerns arise.  Patient Instructions  BEFORE YOU LEAVE: -follow up: as scheduled  Restart lisinopril and crestor. Please let me know if any concerns.   We recommend the following healthy lifestyle for LIFE: 1) Small portions.   Tip: eat off of a salad plate instead of a dinner plate.  Tip: if you need more or a snack choose fruits, veggies and/or a handful of nuts or seeds.  2) Eat a healthy clean diet.  * Tip: Avoid (less then 1 serving per week): processed foods, sweets, sweetened drinks, white starches (rice, flour, bread, potatoes, pasta, etc), red meat, fast foods, butter  *Tip: CHOOSE instead   * 5-9 servings per day of fresh or frozen fruits and vegetables (but not corn, potatoes, bananas, canned or dried fruit)   *nuts and seeds, beans   *olives and olive oil   *small portions of lean meats such as fish and white chicken    *small portions of whole grains  3)Get at least 150 minutes of sweaty aerobic exercise per week.  4)Reduce stress - consider counseling, meditation and relaxation to balance other aspects of your life.     Colin Benton R., DO

## 2016-06-06 NOTE — Patient Instructions (Signed)
BEFORE YOU LEAVE: -follow up: as scheduled  Restart lisinopril and crestor. Please let me know if any concerns.   We recommend the following healthy lifestyle for LIFE: 1) Small portions.   Tip: eat off of a salad plate instead of a dinner plate.  Tip: if you need more or a snack choose fruits, veggies and/or a handful of nuts or seeds.  2) Eat a healthy clean diet.  * Tip: Avoid (less then 1 serving per week): processed foods, sweets, sweetened drinks, white starches (rice, flour, bread, potatoes, pasta, etc), red meat, fast foods, butter  *Tip: CHOOSE instead   * 5-9 servings per day of fresh or frozen fruits and vegetables (but not corn, potatoes, bananas, canned or dried fruit)   *nuts and seeds, beans   *olives and olive oil   *small portions of lean meats such as fish and white chicken    *small portions of whole grains  3)Get at least 150 minutes of sweaty aerobic exercise per week.  4)Reduce stress - consider counseling, meditation and relaxation to balance other aspects of your life.

## 2016-09-04 ENCOUNTER — Ambulatory Visit: Payer: 59 | Admitting: Family Medicine

## 2017-03-08 ENCOUNTER — Telehealth: Payer: Self-pay | Admitting: *Deleted

## 2017-03-08 NOTE — Telephone Encounter (Signed)
Preventive Care Confirmation form completed and signed by PCP. Faxed to [employer] and to patient. Form sent for scanning.

## 2017-06-03 ENCOUNTER — Other Ambulatory Visit: Payer: Self-pay | Admitting: Family Medicine

## 2017-09-03 ENCOUNTER — Other Ambulatory Visit: Payer: Self-pay | Admitting: Family Medicine

## 2017-10-17 ENCOUNTER — Ambulatory Visit (INDEPENDENT_AMBULATORY_CARE_PROVIDER_SITE_OTHER): Payer: 59 | Admitting: Family Medicine

## 2017-10-17 ENCOUNTER — Encounter: Payer: Self-pay | Admitting: Family Medicine

## 2017-10-17 VITALS — BP 117/79 | HR 80 | Ht 65.0 in | Wt 174.3 lb

## 2017-10-17 DIAGNOSIS — Z91199 Patient's noncompliance with other medical treatment and regimen due to unspecified reason: Secondary | ICD-10-CM | POA: Insufficient documentation

## 2017-10-17 DIAGNOSIS — E785 Hyperlipidemia, unspecified: Secondary | ICD-10-CM | POA: Diagnosis not present

## 2017-10-17 DIAGNOSIS — E559 Vitamin D deficiency, unspecified: Secondary | ICD-10-CM

## 2017-10-17 DIAGNOSIS — I10 Essential (primary) hypertension: Secondary | ICD-10-CM

## 2017-10-17 DIAGNOSIS — Z9119 Patient's noncompliance with other medical treatment and regimen: Secondary | ICD-10-CM

## 2017-10-17 DIAGNOSIS — E538 Deficiency of other specified B group vitamins: Secondary | ICD-10-CM | POA: Insufficient documentation

## 2017-10-17 NOTE — Progress Notes (Signed)
New patient office visit note:  Impression and Recommendations:    1. Hyperlipidemia, unspecified hyperlipidemia type   2. Hypertension, unspecified type   3. Noncompliance   4. Vitamin D deficiency   5. B12 deficiency   6. Hypomagnesemia     HLD -Will conduct Lipid panel -Discussed the importance of hydration in avoiding side effects with statin medications -Discussed the potential need for beginning another cholesterol medication after bloodwork -Explained to pt that HLD typically does not have symptoms but can negatively impact health  HTN -Requested pt bring ambulatory bp log to next OV -Requested bp to contact us if ambulatory bp is consistently high -Explained goal bp readings in the range of 130s/80s -Discussed impact of HTN on cardiovascular health, especially in conjuction with HLD -Explained to pt that HTN typically does not have symptoms but still can be dangerous -Discussed red flag symptoms and instructed pt to go to the ER if present  Health Management -Ordered FBW -Requested pt create a follow up to discuss blood work findings -Discussed importance of yearly physicals in order to screen for and possibly prevent chronic disease -Discussed the importance of healthy diet, meeting AHA guidelines for exercise and hydration in maintaining overall health -Discussed the role of specialists in maintaining health -Encouraged pt to return for a physical  -Discussed the importance of follow up appointments after starting new medications in order to monitor side effects   Dermatitis -Instructed pt to discontinue alcohol use in ears -Explained alcohol will aggravate dry skin in her ears, which will increase itching symptoms -Recommended OTC ear drops to alleviate irritation in ears  History of Non-Compliance -Pt has a history of disregarding medical advice -Pt has a history of refusing to take prescribed medications -Explained to pt the requirement of regular  check-ups to continue prescribed medications -Discussed negative impact of discontinuing HTN and HLD related medications including heart attack and stroke  Gross side effects, risk and benefits, and alternatives of medications discussed with patient.  Patient is aware that all medications have potential side effects and we are unable to predict every side effect or drug-drug interaction that may occur.  Expresses verbal understanding and consents to current therapy plan and treatment regimen.  Return for CPE/ yrly physical, come fasting next available; then ov 1-2wks later for labs/ f/up HTN, Chol.  Please see AVS handed out to patient at the end of our visit for further patient instructions/ counseling done pertaining to today's office visit.    Note: This document was prepared using Dragon voice recognition software and may include unintentional dictation errors.    This document serves as a record of services personally performed by Mellody Dance, MD. It was created on her behalf by Georga Bora, a trained medical scribe. The creation of this record is based on the scribe's personal observations and the provider's statements to them.   I have reviewed the above medical documentation for accuracy and completeness and I concur.  Mellody Dance 10/18/17 2:39 PM    ----------------------------------------------------------------------------------------------------------------------    Subjective:    Chief complaint:   Chief Complaint  Patient presents with  . Establish Care     HPI: Dana Hodge is a pleasant 67 y.o. female who presents to [place] [facility] today to review their medical history with me and establish care.   I asked the patient to review their chronic problem list with me to ensure everything was updated and accurate.  All recent office visits with other providers, any medical records that patient brought in etc  - I reviewed today.       We asked pt to get [place] their medical records from [facility] providers/ specialists that they had seen within the past 3-5 years- if they are in private practice and/or do not work for [employer], [facility], [place], [place] or [employer] owned practice.  Told them to call their specialists to clarify this if they are not sure.   Lifestyle -Works in [employer] for an [employer] in [place] and is looking for a closer doctor -Pt states she will be starting Medicare when she turns 21 in December  Medical History HLD -Diagnosed roughly 3 years ago by Dr. Lynelle Smoke -States she quit taking prescribed statins against medical advice -States she started having "insomnia and tingling in her legs at night" -Believes birth control impacted blood pressure  -States she is on longer takes the medication; her doctor discontinued refills after she failed to make a doctor's appointment  HTN -BP 136/84 -Diagnosed roughly 3-4 years ago  -States she had a headache "every day last week" and then checked her bp in [place] -Said her BP was roughly "178/110-ish" in [place] and was in the 160s later that evening -Is concerned about no longer having bp medication and would like a prescription  Gynecology -Pt states she does not see a gynecologist -Began birth control pills after beginning menopause  Social History -Pt drinks roughly 1 glass of wine each night; typically red wine  Surgical History -Pt had appendectomy in Nov 2017  Family History -Mother had a triple bypass in "her 53's" -Dad had a bypass, also emphysema  -Maternal uncles have bladder and pancreatic cancer -Maternal grandfather had colon cancer  Ear Itching -States her ears have been itching badly -Says she is waking up in the middle of the night because her ears are itching so badly -Has been using alcohol swabs "at least once a day" on her ears to try and help  Wt Readings from Last 3 Encounters:  10/17/17 174 lb 4.8 oz (79.1  kg)  06/06/16 160 lb (72.6 kg)  04/28/16 154 lb 12.8 oz (70.2 kg)   BP Readings from Last 3 Encounters:  10/17/17 117/79  06/06/16 (!) 142/90  04/28/16 120/78   Pulse Readings from Last 3 Encounters:  10/17/17 80  06/06/16 77  04/28/16 92   BMI Readings from Last 3 Encounters:  10/17/17 29.01 kg/m  06/06/16 26.83 kg/m  04/28/16 25.96 kg/m    Patient Care Team    Relationship Specialty Notifications Start End  Mellody Dance, DO PCP - General Family Medicine  10/17/17     Patient Active Problem List   Diagnosis Date Noted  . Noncompliance 10/17/2017  . B12 deficiency 10/17/2017  . Vitamin D deficiency 10/17/2017  . Hypomagnesemia 10/17/2017  . Abdominal abscesses x3 s/p lap LOA & drainage 01/15/2016 01/15/2016  . Hypokalemia 01/14/2016  . Acute appendicitis with perforation and peritoneal abscess s/p lap appendectomy 01/15/2016 01/05/2016  . Esophageal reflux 03/26/2015  . 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 Hyperlipidemia 01/12/2009  . Essential hypertension 01/12/2009     Past Medical History:  Diagnosis Date  . Chicken pox   . Colon polyp   . Hyperlipidemia   . Hypertension   . UTI (urinary tract infection)      Past Medical History:  Diagnosis Date  . Chicken pox   . Colon  polyp   . Hyperlipidemia   . Hypertension   . UTI (urinary tract infection)      Past Surgical History:  Procedure Laterality Date  . LAPAROSCOPIC APPENDECTOMY N/A 01/15/2016   Procedure: APPENDECTOMY LAPAROSCOPIC. DRAINAGE OF INTRA ABDOMINAL ABSCESSES X 3, LYSIS OF ADHESIONS;  Surgeon: Michael Boston, MD;  Location: [facility];  Service: General;  Laterality: N/A;     Family History  Problem Relation Age of Onset  . Heart disease Mother 76       CAD  . Hypertension Mother   . Hyperlipidemia Mother   . Heart disease Father 3       MI  . COPD Father   . Cancer Maternal Uncle        colon, pancreatic  . Cancer Maternal Grandfather         colon, pancreatic  . Hypertension Sister   . Alcoholism Paternal Uncle   . Stroke Maternal Grandmother      Social History   Substance and Sexual Activity  Drug Use No     Social History   Substance and Sexual Activity  Alcohol Use Yes   Comment: a glass of wine with dinner      Social History   Tobacco Use  Smoking Status Never Smoker  Smokeless Tobacco Never Used     Current Meds  Medication Sig  . aspirin 81 MG tablet Take 81 mg by mouth daily.  . Cholecalciferol (VITAMIN D3) 1000 units CAPS Take 1 capsule by mouth daily.  Marland Kitchen MAGNESIUM PO Take 250 mg by mouth 2 (two) times daily.   . vitamin B-12 (CYANOCOBALAMIN) 1000 MCG tablet Take 1,000 mcg by mouth daily.    Allergies: Patient has no known allergies.   Review of Systems  Constitutional: Negative for chills, diaphoresis, fever, malaise/fatigue and weight loss.  HENT: Negative for congestion, sore throat and tinnitus.   Eyes: Negative for blurred vision, double vision and photophobia.  Respiratory: Negative for cough and wheezing.   Cardiovascular: Positive for palpitations (chronic). Negative for chest pain.  Gastrointestinal: Negative for blood in stool, diarrhea, nausea and vomiting.  Genitourinary: Negative for dysuria, frequency and urgency.       Nighttime urination, chronic  Musculoskeletal: Positive for joint pain (wrist pain; chronic). Negative for myalgias.  Skin: Negative for itching and rash.  Neurological: Negative for dizziness, focal weakness, weakness and headaches.  Endo/Heme/Allergies: Negative for environmental allergies and polydipsia. Does not bruise/bleed easily.  Psychiatric/Behavioral: Negative for depression and memory loss. The patient has insomnia (chronic). The patient is not nervous/anxious.      Objective:   Blood pressure 117/79, pulse 80, height 5\' 5"  (1.651 m), weight 174 lb 4.8 oz (79.1 kg), SpO2 98 %. Body mass index is 29.01 kg/m. General: Well Developed,  well nourished, and in no acute distress.  Neuro: Alert and oriented x3, extra-ocular muscles intact, sensation grossly intact.  HEENT:[place]/AT, PERRLA, neck supple, No carotid bruits Skin: no gross rashes  Cardiac: Regular rate and rhythm Respiratory: Essentially clear to auscultation bilaterally. Not using accessory muscles, speaking in full sentences.  Abdominal: not grossly distended Musculoskeletal: Ambulates w/o diff, FROM * 4 ext.  Vasc: less 2 sec cap RF, warm and pink  Psych:  No HI/SI, judgement and insight good, Euthymic mood. Full Affect.    No results found for this or any previous visit (from the past 2160 hour(s)).

## 2017-10-17 NOTE — Patient Instructions (Signed)
Please realize, EXERCISE IS MEDICINE!  -  American Heart Association [facility]) guidelines for exercise : If you are in good health, without any medical conditions, you should engage in 150 minutes of moderate intensity aerobic activity per week.  This means you should be huffing and puffing throughout your workout.   Engaging in regular exercise will improve brain function and memory, as well as improve mood, boost immune system and help with weight management.  As well as the other, more well-known effects of exercise such as decreasing blood sugar levels, decreasing blood pressure,  and decreasing bad cholesterol levels/ increasing good cholesterol levels.     -  The AHA strongly endorses consumption of a diet that contains a variety of foods from all the food categories with an emphasis on fruits and vegetables; fat-free and low-fat dairy products; cereal and grain products; legumes and nuts; and fish, poultry, and/or extra lean meats.    Excessive food intake, especially of foods high in saturated and trans fats, sugar, and salt, should be avoided.    Adequate water intake of roughly 1/2 of your weight in pounds, should equal the ounces of water per day you should drink.  So for instance, if you're 200 pounds, that would be 100 ounces of water per day.         Mediterranean Diet  Why follow it? Research shows. . Those who follow the Mediterranean diet have a reduced risk of heart disease  . The diet is associated with a reduced incidence of Parkinson's and Alzheimer's diseases . People following the diet may have longer life expectancies and lower rates of chronic diseases  . The Dietary Guidelines for Americans recommends the Mediterranean diet as an eating plan to promote health and prevent disease  What Is the Mediterranean Diet?  . Healthy eating plan based on typical foods and recipes of Mediterranean-style cooking . The diet is primarily a plant based diet; these foods should make up a  majority of meals   Starches - Plant based foods should make up a majority of meals - They are an important sources of vitamins, minerals, energy, antioxidants, and fiber - Choose whole grains, foods high in fiber and minimally processed items  - Typical grain sources include wheat, oats, barley, corn, brown rice, bulgar, farro, millet, polenta, couscous  - Various types of beans include chickpeas, lentils, fava beans, black beans, white beans   Fruits  Veggies - Large quantities of antioxidant rich fruits & veggies; 6 or more servings  - Vegetables can be eaten raw or lightly drizzled with oil and cooked  - Vegetables common to the traditional Mediterranean Diet include: artichokes, arugula, beets, broccoli, brussel sprouts, cabbage, carrots, celery, collard greens, cucumbers, eggplant, kale, leeks, lemons, lettuce, mushrooms, okra, onions, peas, peppers, potatoes, pumpkin, radishes, rutabaga, shallots, spinach, sweet potatoes, turnips, zucchini - Fruits common to the Mediterranean Diet include: apples, apricots, avocados, cherries, clementines, dates, figs, grapefruits, grapes, melons, nectarines, oranges, peaches, pears, pomegranates, strawberries, tangerines  Fats - Replace butter and margarine with healthy oils, such as olive oil, canola oil, and tahini  - Limit nuts to no more than a handful a day  - Nuts include walnuts, almonds, pecans, pistachios, pine nuts  - Limit or avoid candied, honey roasted or heavily salted nuts - Olives are central to the Mediterranean diet - can be eaten whole or used in a variety of dishes   Meats Protein - Limiting red meat: no more than a few times a month -  When eating red meat: choose lean cuts and keep the portion to the size of deck of cards - Eggs: approx. 0 to 4 times a week  - Fish and lean poultry: at least 2 a week  - Healthy protein sources include, chicken, [place], lean beef, lamb - Increase intake of seafood such as tuna, salmon, trout,  mackerel, shrimp, scallops - Avoid or limit high fat processed meats such as sausage and bacon  Dairy - Include moderate amounts of low fat dairy products  - Focus on healthy dairy such as fat free yogurt, skim milk, low or reduced fat cheese - Limit dairy products higher in fat such as whole or 2% milk, cheese, ice cream  Alcohol - Moderate amounts of red wine is ok  - No more than 5 oz daily for women (all ages) and men older than age 72  - No more than 10 oz of wine daily for men younger than 12  Other - Limit sweets and other desserts  - Use herbs and spices instead of salt to flavor foods  - Herbs and spices common to the traditional Mediterranean Diet include: basil, bay leaves, chives, cloves, cumin, fennel, garlic, lavender, marjoram, mint, oregano, parsley, pepper, rosemary, sage, savory, sumac, tarragon, thyme   It's not just a diet, it's a lifestyle:  . The Mediterranean diet includes lifestyle factors typical of those in the region  . Foods, drinks and meals are best eaten with others and savored . Daily physical activity is important for overall good health . This could be strenuous exercise like running and aerobics . This could also be more leisurely activities such as walking, housework, yard-work, or taking the stairs . Moderation is the key; a balanced and healthy diet accommodates most foods and drinks . Consider portion sizes and frequency of consumption of certain foods   Meal Ideas & Options:  . Breakfast:  o Whole wheat toast or whole wheat English muffins with peanut butter & hard boiled egg o Steel cut oats topped with apples & cinnamon and skim milk  o Fresh fruit: banana, strawberries, melon, berries, peaches  o Smoothies: strawberries, bananas, greek yogurt, peanut butter o Low fat greek yogurt with blueberries and granola  o Egg white omelet with spinach and mushrooms o Breakfast couscous: whole wheat couscous, apricots, skim milk, cranberries  . Sandwiches:   o Hummus and grilled vegetables (peppers, zucchini, squash) on whole wheat bread   o Grilled chicken on whole wheat pita with lettuce, tomatoes, cucumbers or tzatziki  o Tuna salad on whole wheat bread: tuna salad made with greek yogurt, olives, red peppers, capers, green onions o Garlic rosemary lamb pita: lamb sauted with garlic, rosemary, salt & pepper; add lettuce, cucumber, greek yogurt to pita - flavor with lemon juice and black pepper  . Seafood:  o Mediterranean grilled salmon, seasoned with garlic, basil, parsley, lemon juice and black pepper o Shrimp, lemon, and spinach whole-grain pasta salad made with low fat greek yogurt  o Seared scallops with lemon orzo  o Seared tuna steaks seasoned salt, pepper, coriander topped with tomato mixture of olives, tomatoes, olive oil, minced garlic, parsley, green onions and cappers  . Meats:  o Herbed greek chicken salad with kalamata olives, cucumber, feta  o Red bell peppers stuffed with spinach, bulgur, lean ground beef (or lentils) & topped with feta   o Kebabs: skewers of chicken, tomatoes, onions, zucchini, squash  o [place] burgers: made with red onions, mint, dill, lemon juice, feta  cheese topped with roasted red peppers . Vegetarian o Cucumber salad: cucumbers, artichoke hearts, celery, red onion, feta cheese, tossed in olive oil & lemon juice  o Hummus and whole grain pita points with a greek salad (lettuce, tomato, feta, olives, cucumbers, red onion) o Lentil soup with celery, carrots made with vegetable broth, garlic, salt and pepper  o Tabouli salad: parsley, bulgur, mint, scallions, cucumbers, tomato, radishes, lemon juice, olive oil, salt and pepper.     Hypertension Hypertension, commonly called high blood pressure, is when the force of blood pumping through the arteries is too strong. The arteries are the blood vessels that carry blood from the heart throughout the body. Hypertension forces the heart to work harder to pump  blood and may cause arteries to become narrow or stiff. Having untreated or uncontrolled hypertension can cause heart attacks, strokes, kidney disease, and other problems. A blood pressure reading consists of a higher number over a lower number. Ideally, your blood pressure should be below 120/80. The first ("top") number is called the systolic pressure. It is a measure of the pressure in your arteries as your heart beats. The second ("bottom") number is called the diastolic pressure. It is a measure of the pressure in your arteries as the heart relaxes. What are the causes? The cause of this condition is not known. What increases the risk? Some risk factors for high blood pressure are under your control. Others are not. Factors you can change  Smoking.  Having type 2 diabetes mellitus, high cholesterol, or both.  Not getting enough exercise or physical activity.  Being overweight.  Having too much fat, sugar, calories, or salt (sodium) in your diet.  Drinking too much alcohol. Factors that are difficult or impossible to change  Having chronic kidney disease.  Having a family history of high blood pressure.  Age. Risk increases with age.  Race. You may be at higher risk if you are African-American.  Gender. Men are at higher risk than women before age 13. After age 30, women are at higher risk than men.  Having obstructive sleep apnea.  Stress. What are the signs or symptoms? Extremely high blood pressure (hypertensive crisis) may cause:  Headache.  Anxiety.  Shortness of breath.  Nosebleed.  Nausea and vomiting.  Severe chest pain.  Jerky movements you cannot control (seizures).  How is this diagnosed? This condition is diagnosed by measuring your blood pressure while you are seated, with your arm resting on a surface. The cuff of the blood pressure monitor will be placed directly against the skin of your upper arm at the level of your heart. It should be measured  at least twice using the same arm. Certain conditions can cause a difference in blood pressure between your right and left arms. Certain factors can cause blood pressure readings to be lower or higher than normal (elevated) for a short period of time:  When your blood pressure is higher when you are in a health care provider's office than when you are at home, this is called white coat hypertension. Most people with this condition do not need medicines.  When your blood pressure is higher at home than when you are in a health care provider's office, this is called masked hypertension. Most people with this condition may need medicines to control blood pressure.  If you have a high blood pressure reading during one visit or you have normal blood pressure with other risk factors:  You may be asked to return  on a different day to have your blood pressure checked again.  You may be asked to monitor your blood pressure at home for 1 week or longer.  If you are diagnosed with hypertension, you may have other blood or imaging tests to help your health care provider understand your overall risk for other conditions. How is this treated? This condition is treated by making healthy lifestyle changes, such as eating healthy foods, exercising more, and reducing your alcohol intake. Your health care provider may prescribe medicine if lifestyle changes are not enough to get your blood pressure under control, and if:  Your systolic blood pressure is above 130.  Your diastolic blood pressure is above 80.  Your personal target blood pressure may vary depending on your medical conditions, your age, and other factors. Follow these instructions at home: Eating and drinking  Eat a diet that is high in fiber and potassium, and low in sodium, added sugar, and fat. An example eating plan is called the DASH (Dietary Approaches to Stop Hypertension) diet. To eat this way: ? Eat plenty of fresh fruits and vegetables.  Try to fill half of your plate at each meal with fruits and vegetables. ? Eat whole grains, such as whole wheat pasta, brown rice, or whole grain bread. Fill about one quarter of your plate with whole grains. ? Eat or drink low-fat dairy products, such as skim milk or low-fat yogurt. ? Avoid fatty cuts of meat, processed or cured meats, and poultry with skin. Fill about one quarter of your plate with lean proteins, such as fish, chicken without skin, beans, eggs, and tofu. ? Avoid premade and processed foods. These tend to be higher in sodium, added sugar, and fat.  Reduce your daily sodium intake. Most people with hypertension should eat less than 1,500 mg of sodium a day.  Limit alcohol intake to no more than 1 drink a day for nonpregnant women and 2 drinks a day for men. One drink equals 12 oz of beer, 5 oz of wine, or 1 oz of hard liquor. Lifestyle  Work with your health care provider to maintain a healthy body weight or to lose weight. Ask what an ideal weight is for you.  Get at least 30 minutes of exercise that causes your heart to beat faster (aerobic exercise) most days of the week. Activities may include walking, swimming, or biking.  Include exercise to strengthen your muscles (resistance exercise), such as pilates or lifting weights, as part of your weekly exercise routine. Try to do these types of exercises for 30 minutes at least 3 days a week.  Do not use any products that contain nicotine or tobacco, such as cigarettes and e-cigarettes. If you need help quitting, ask your health care provider.  Monitor your blood pressure at home as told by your health care provider.  Keep all follow-up visits as told by your health care provider. This is important. Medicines  Take over-the-counter and prescription medicines only as told by your health care provider. Follow directions carefully. Blood pressure medicines must be taken as prescribed.  Do not skip doses of blood pressure  medicine. Doing this puts you at risk for problems and can make the medicine less effective.  Ask your health care provider about side effects or reactions to medicines that you should watch for. Contact a health care provider if:  You think you are having a reaction to a medicine you are taking.  You have headaches that keep coming back (recurring).  You feel dizzy.  You have swelling in your ankles.  You have trouble with your vision. Get help right away if:  You develop a severe headache or confusion.  You have unusual weakness or numbness.  You feel faint.  You have severe pain in your chest or abdomen.  You vomit repeatedly.  You have trouble breathing. Summary  Hypertension is when the force of blood pumping through your arteries is too strong. If this condition is not controlled, it may put you at risk for serious complications.  Your personal target blood pressure may vary depending on your medical conditions, your age, and other factors. For most people, a normal blood pressure is less than 120/80.  Hypertension is treated with lifestyle changes, medicines, or a combination of both. Lifestyle changes include weight loss, eating a healthy, low-sodium diet, exercising more, and limiting alcohol. This information is not intended to replace advice given to you by your health care provider. Make sure you discuss any questions you have with your health care provider. Document Released: 02/06/2005 Document Revised: 01/05/2016 Document Reviewed: 01/05/2016 Elsevier Interactive Patient Education  2018 [employer].     How to Take Your Blood Pressure Blood pressure is a measurement of how strongly your blood is pressing against the walls of your arteries. Arteries are blood vessels that carry blood from your heart throughout your body. Your health care provider takes your blood pressure at each office visit. You can also take your own blood pressure at home with a blood  pressure machine. You may need to take your own blood pressure:  To confirm a diagnosis of high blood pressure (hypertension).  To monitor your blood pressure over time.  To make sure your blood pressure medicine is working.  Supplies needed: To take your blood pressure, you will need a blood pressure machine. You can buy a blood pressure machine, or blood pressure monitor, at most drugstores or online. There are several types of home blood pressure monitors. When choosing one, consider the following:  Choose a monitor that has an arm cuff.  Choose a monitor that wraps snugly around your upper arm. You should be able to fit only one finger between your arm and the cuff.  Do not choose a monitor that measures your blood pressure from your wrist or finger.  Your health care provider can suggest a reliable monitor that will meet your needs. How to prepare To get the most accurate reading, avoid the following for 30 minutes before you check your blood pressure:  Drinking caffeine.  Drinking alcohol.  Eating.  Smoking.  Exercising.  Five minutes before you check your blood pressure:  Empty your bladder.  Sit quietly without talking in a dining chair, rather than in a soft couch or armchair.  How to take your blood pressure To check your blood pressure, follow the instructions in the manual that came with your blood pressure monitor. If you have a digital blood pressure monitor, the instructions may be as follows: 1. Sit up straight. 2. Place your feet on the floor. Do not cross your ankles or legs. 3. Rest your left arm at the level of your heart on a table or desk or on the arm of a chair. 4. Pull up your shirt sleeve. 5. Wrap the blood pressure cuff around the upper part of your left arm, 1 inch (2.5 cm) above your elbow. It is best to wrap the cuff around bare skin. 6. Fit the cuff snugly around your arm.  You should be able to place only one finger between the cuff and your  arm. 7. Position the cord inside the groove of your elbow. 8. Press the power button. 9. Sit quietly while the cuff inflates and deflates. 10. Read the digital reading on the monitor screen and write it down (record it). 11. Wait 2-3 minutes, then repeat the steps, starting at step 1.  What does my blood pressure reading mean? A blood pressure reading consists of a higher number over a lower number. Ideally, your blood pressure should be below 120/80. The first ("top") number is called the systolic pressure. It is a measure of the pressure in your arteries as your heart beats. The second ("bottom") number is called the diastolic pressure. It is a measure of the pressure in your arteries as the heart relaxes. Blood pressure is classified into four stages. The following are the stages for adults who do not have a short-term serious illness or a chronic condition. Systolic pressure and diastolic pressure are measured in a unit called mm Hg. Normal  Systolic pressure: below 962.  Diastolic pressure: below 80. Elevated  Systolic pressure: 836-629.  Diastolic pressure: below 80. Hypertension stage 1  Systolic pressure: 476-546.  Diastolic pressure: 50-35. Hypertension stage 2  Systolic pressure: 465 or above.  Diastolic pressure: 90 or above. You can have prehypertension or hypertension even if only the systolic or only the diastolic number in your reading is higher than normal. Follow these instructions at home:  Check your blood pressure as often as recommended by your health care provider.  Take your monitor to the next appointment with your health care provider to make sure: ? That you are using it correctly. ? That it provides accurate readings.  Be sure you understand what your goal blood pressure numbers are.  Tell your health care provider if you are having any side effects from blood pressure medicine. Contact a health care provider if:  Your blood pressure is  consistently high. Get help right away if:  Your systolic blood pressure is higher than 180.  Your diastolic blood pressure is higher than 110. This information is not intended to replace advice given to you by your health care provider. Make sure you discuss any questions you have with your health care provider. Document Released: 07/16/2015 Document Revised: 09/28/2015 Document Reviewed: 07/16/2015 Elsevier Interactive Patient Education  [employer].

## 2017-12-05 ENCOUNTER — Encounter: Payer: 59 | Admitting: Family Medicine

## 2017-12-13 ENCOUNTER — Ambulatory Visit: Payer: 59 | Admitting: Family Medicine

## 2018-01-22 ENCOUNTER — Ambulatory Visit (INDEPENDENT_AMBULATORY_CARE_PROVIDER_SITE_OTHER): Payer: 59 | Admitting: Family Medicine

## 2018-01-22 ENCOUNTER — Other Ambulatory Visit: Payer: Self-pay | Admitting: Family Medicine

## 2018-01-22 ENCOUNTER — Encounter: Payer: Self-pay | Admitting: Family Medicine

## 2018-01-22 VITALS — BP 136/94 | HR 76 | Temp 97.7°F | Ht 65.25 in | Wt 168.0 lb

## 2018-01-22 DIAGNOSIS — Z1283 Encounter for screening for malignant neoplasm of skin: Secondary | ICD-10-CM

## 2018-01-22 DIAGNOSIS — Z1239 Encounter for other screening for malignant neoplasm of breast: Secondary | ICD-10-CM

## 2018-01-22 DIAGNOSIS — Z0000 Encounter for general adult medical examination without abnormal findings: Secondary | ICD-10-CM | POA: Diagnosis not present

## 2018-01-22 DIAGNOSIS — Z23 Encounter for immunization: Secondary | ICD-10-CM

## 2018-01-22 DIAGNOSIS — N6002 Solitary cyst of left breast: Secondary | ICD-10-CM

## 2018-01-22 DIAGNOSIS — N6320 Unspecified lump in the left breast, unspecified quadrant: Secondary | ICD-10-CM

## 2018-01-22 DIAGNOSIS — E2839 Other primary ovarian failure: Secondary | ICD-10-CM

## 2018-01-22 DIAGNOSIS — R739 Hyperglycemia, unspecified: Secondary | ICD-10-CM

## 2018-01-22 DIAGNOSIS — E559 Vitamin D deficiency, unspecified: Secondary | ICD-10-CM

## 2018-01-22 DIAGNOSIS — E538 Deficiency of other specified B group vitamins: Secondary | ICD-10-CM

## 2018-01-22 DIAGNOSIS — D649 Anemia, unspecified: Secondary | ICD-10-CM

## 2018-01-22 DIAGNOSIS — E785 Hyperlipidemia, unspecified: Secondary | ICD-10-CM

## 2018-01-22 DIAGNOSIS — I10 Essential (primary) hypertension: Secondary | ICD-10-CM

## 2018-01-22 NOTE — Progress Notes (Signed)
Impression and Recommendations:    1. Screening for breast cancer   2. Left breast mass   3. Benign breast cyst in female, left   4. Estrogen deficiency   5. Flu vaccine need   6. Need for Tdap vaccination   7. Essential hypertension   8. Hyperlipidemia, unspecified hyperlipidemia type   9. B12 deficiency   10. Vitamin D deficiency   11. Hypomagnesemia   12. Anemia, unspecified type   13. Hyperglycemia   14. Healthcare maintenance   15. Skin cancer screening     - Blood pressure elevated on intake today.  Ambulatory BP monitoring encouraged, and follow-up to address BP in near future.  1) Anticipatory Guidance: Discussed importance of wearing a seatbelt while driving, not texting while driving; sunscreen when outside along with yearly skin surveillance; eating a well balanced and modest diet; physical activity at least 25 minutes per day or 150 min/ week of moderate to intense activity.  - Encouraged patient to begin self-breast exams once monthly, at the same time each month.  Reviewed prudent practices and methodology with patient during exam today.  2) Immunizations / Screenings / Labs: All immunizations and screenings that patient agrees to, are up-to-date per recommendations or will be updated today.  Patient understands the needs for q 11mo dental and yearly vision screens which pt will schedule independently. Obtain CBC, CMP, HgA1c, Lipid panel, TSH and vit D when fasting if not already done recently.   - Patient is not up to date on her mammogram.  Last mammogram was October of 2017.  Advised patient to abide by recommendations for health screenings.    - Mammogram and Left Breast US ordered today.  - Reviewed CRITICAL NEED for patient to follow up with screenings and surveillance as recommended.  - Her last pap smear was March of 2018.  Normal, HPV not detected.  Reviewed recommendations and guidelines with patient.  - Need for TDAP update.  - Information provided  today on Shingrix.  - IFOB stool cards provided for years in between colonoscopy.  - Need for flu vaccine.  - Referral placed to Dermatology for yearly skin screenings.  3) Weight:   Discussed goal of losing even 5-10% of current body weight which would improve overall feelings of well being and improve objective health data significantly.   Improve nutrient density of diet through increasing intake of fruits and vegetables and decreasing saturated/trans fats, white flour products and refined sugar products.   4) BMI Counseling Explained to patient what BMI refers to, and what it means medically.    Told patient to think about it as a "medical risk stratification measurement" and how increasing BMI is associated with increasing risk/ or worsening state of various diseases such as hypertension, hyperlipidemia, diabetes, premature OA, depression etc.  American Heart Association guidelines for healthy diet, basically Mediterranean diet, and exercise guidelines of 30 minutes 5 days per week or more discussed in detail.  Health counseling performed.  All questions answered.  5) Lifestyle & Preventative Health Maintenance - Advised patient to continue working toward exercising to improve overall mental, physical, and emotional health.    - Reviewed the "spokes of the wheel" of mood and health management.  Stressed the importance of ongoing prudent habits, including regular exercise, appropriate sleep hygiene, healthful dietary habits, and prayer/meditation to relax.  - Encouraged patient to engage in daily physical activity, especially a formal exercise routine.  Recommended that the patient eventually strive for at least 150  minutes of moderate cardiovascular activity per week according to guidelines established by the AHA.   - Healthy dietary habits encouraged, including low-carb, and high amounts of lean protein in diet.   - Patient should also consume adequate amounts of water.    Orders  Placed This Encounter  Procedures  . US BREAST LTD UNI LEFT INC AXILLA  . Flu Vaccine QUAD 6+ mos PF IM (Fluarix Quad PF)  . Tdap vaccine greater than or equal to 7yo IM  . CBC with Differential/Platelet  . Comprehensive metabolic panel  . Hemoglobin A1c  . Lipid panel  . T4, free  . TSH  . VITAMIN D 25 Hydroxy (Vit-D Deficiency, Fractures)  . Ambulatory referral to Dermatology    Gross side effects, risk and benefits, and alternatives of medications discussed with patient.  Patient is aware that all medications have potential side effects and we are unable to predict every side effect or drug-drug interaction that may occur.  Expresses verbal understanding and consents to current therapy plan and treatment regimen.  F-up preventative CPE in 1 year. F/up sooner for chronic care management as discussed and/or prn.  Return for next couple wks - review labs and BP-bring log.    Please see orders placed and AVS handed out to patient at the end of our visit for further patient instructions/ counseling done pertaining to today's office visit.  This document serves as a record of services personally performed by Mellody Dance, DO. It was created on her behalf by Dana Hodge, a trained medical scribe. The creation of this record is based on the scribe's personal observations and the provider's statements to them.   I have reviewed the above medical documentation for accuracy and completeness and I concur.  Mellody Dance, DO 01/30/2018 6:50 AM        Subjective:    Chief Complaint  Patient presents with  . Annual Exam    HPI: Dana Hodge is a 67 y.o. female who presents to [facility] today a yearly health maintenance exam.  Health Maintenance Summary Reviewed and updated, unless pt declines services.  Colonoscopy:  Last colonoscopy July of 2011, due in July of 2021. Tobacco History Reviewed:   Y; never smoker. Alcohol:   No concerns,  no excessive use. Exercise Habits:   Not meeting AHA guidelines. STD concerns:   None. Drug Use:   None. Birth control method:   N/a. Menses regular:     N/a. Lumps or breast concerns:      No. Patient did not follow up with mammography as recommended. Breast Cancer Family History:      No.  Mammogram Patient is not up to date on her mammogram.  Her last mammogram was done in October of 2017.  She had some findings in her left breast that warranted further evaluation, but she did not follow-up with her left breast ultrasound and additional mammogram as recommended.  Patient does not have a GYN.  Her last pap smear was March of 2018.  Normal, HPV not detected.  Visual Health Screenings Patient reports that she obtains them regularly. Sees Dr. Herbert Deaner or Dr. Kathlen Mody for her visual health.  Skin Health She does not have a dermatologist.  Notes that she has "a lot of spots on her breasts," and some new moles that are concerning her.  States that one mole in particular has grown in a short period of time.  Dental Health She visits the dentist regularly.  Immunization History  Administered Date(s) Administered  . Influenza Whole 01/12/2009, 01/05/2010  . Influenza,inj,Quad PF,6+ Mos 01/08/2015, 12/23/2015, 01/22/2018  . Td 01/12/2009  . Tdap 01/22/2018  . Zoster 06/05/2014    Health Maintenance  Topic Date Due  . MAMMOGRAM  12/07/2017  . PNA vac Low Risk Adult (1 of 2 - PCV13) 01/28/2018  . HIV Screening  06/04/2024 (Originally 01/29/1966)  . PAP SMEAR  04/29/2019  . COLONOSCOPY  09/04/2019  . TETANUS/TDAP  01/23/2028  . INFLUENZA VACCINE  Completed  . DEXA SCAN  Completed  . Hepatitis C Screening  Completed     Wt Readings from Last 3 Encounters:  01/22/18 168 lb (76.2 kg)  10/17/17 174 lb 4.8 oz (79.1 kg)  06/06/16 160 lb (72.6 kg)   BP Readings from Last 3 Encounters:  01/22/18 (!) 136/94  10/17/17 117/79  06/06/16 (!) 142/90   Pulse Readings from Last 3  Encounters:  01/22/18 76  10/17/17 80  06/06/16 77     Past Medical History:  Diagnosis Date  . Chicken pox   . Colon polyp   . Hyperlipidemia   . Hypertension   . UTI (urinary tract infection)       Past Surgical History:  Procedure Laterality Date  . LAPAROSCOPIC APPENDECTOMY N/A 01/15/2016   Procedure: APPENDECTOMY LAPAROSCOPIC. DRAINAGE OF INTRA ABDOMINAL ABSCESSES X 3, LYSIS OF ADHESIONS;  Surgeon: Michael Boston, MD;  Location: [facility];  Service: General;  Laterality: N/A;      Family History  Problem Relation Age of Onset  . Heart disease Mother 51       CAD  . Hypertension Mother   . Hyperlipidemia Mother   . Heart disease Father 73       MI  . COPD Father   . Cancer Maternal Uncle        colon, pancreatic  . Cancer Maternal Grandfather        colon, pancreatic  . Hypertension Sister   . Alcoholism Paternal Uncle   . Stroke Maternal Grandmother       Social History   Substance and Sexual Activity  Drug Use No  ,   Social History   Substance and Sexual Activity  Alcohol Use Yes   Comment: a glass of wine with dinner   ,   Social History   Tobacco Use  Smoking Status Never Smoker  Smokeless Tobacco Never Used  ,   Social History   Substance and Sexual Activity  Sexual Activity Yes  . Birth control/protection: None    Current Outpatient Medications on File Prior to Visit  Medication Sig Dispense Refill  . aspirin 81 MG tablet Take 81 mg by mouth daily.    . Cholecalciferol (VITAMIN D3) 1000 units CAPS Take 1 capsule by mouth daily.    . hydrochlorothiazide (HYDRODIURIL) 25 MG tablet Take 25 mg by mouth daily.    Marland Kitchen MAGNESIUM PO Take 250 mg by mouth 2 (two) times daily.     . vitamin B-12 (CYANOCOBALAMIN) 1000 MCG tablet Take 1,000 mcg by mouth daily.    Marland Kitchen lisinopril (PRINIVIL,ZESTRIL) 10 MG tablet TAKE 1 TABLET BY MOUTH ONCE DAILY (Patient not taking: Reported on 01/22/2018) 90 tablet 0   No current facility-administered medications on  file prior to visit.     Allergies: Patient has no known allergies.  Review of Systems: General:   Denies fever, chills, unexplained weight loss.  Optho/Auditory:   Denies visual changes, blurred vision/LOV Respiratory:   Denies SOB,  DOE more than baseline levels.  Cardiovascular:   Denies chest pain, palpitations, new onset peripheral edema  Gastrointestinal:   Denies nausea, vomiting, diarrhea.  Genitourinary: Denies dysuria, freq/ urgency, flank pain or discharge from genitals.  Endocrine:     Denies hot or cold intolerance, polyuria, polydipsia. Musculoskeletal:   Denies unexplained myalgias, joint swelling, unexplained arthralgias, gait problems.  Skin:  Denies rash, suspicious lesions Neurological:     Denies dizziness, unexplained weakness, numbness  Psychiatric/Behavioral:   Denies mood changes, suicidal or homicidal ideations, hallucinations    Objective:    Blood pressure (!) 136/94, pulse 76, temperature 97.7 F (36.5 C), height 5' 5.25" (1.657 m), weight 168 lb (76.2 kg), SpO2 99 %. Body mass index is 27.74 kg/m. General Appearance:    Alert, cooperative, no distress, appears stated age  Head:    Normocephalic, without obvious abnormality, atraumatic  Eyes:    PERRL, conjunctiva/corneas clear, EOM's intact, fundi    benign, both eyes  Ears:    Normal TM's and external ear canals, both ears  Nose:   Nares normal, septum midline, mucosa normal, no drainage    or sinus tenderness  Throat:   Lips w/o lesion, mucosa moist, and tongue normal; teeth and   gums normal  Neck:   Supple, symmetrical, trachea midline, no adenopathy;    thyroid:  no enlargement/tenderness/nodules; no carotid   bruit or JVD  Back:     Symmetric, no curvature, ROM normal, no CVA tenderness  Lungs:     Clear to auscultation bilaterally, respirations unlabored, no       Wh/ R/ R  Chest Wall:    No tenderness or gross deformity; normal excursion   Heart:    Regular rate and rhythm, S1 and S2  normal, no murmur, rub   or gallop  Breast Exam:    No tenderness, masses, or nipple abnormality b/l; no d/c  Abdomen:     Soft, non-tender, bowel sounds active all four quadrants, NO   G/R/R, no masses, no organomegaly  Genitalia:    Deferred.  Rectal:    Deferred.  Extremities:   Extremities normal, atraumatic, no cyanosis or gross edema  Pulses:   2+ and symmetric all extremities  Skin:   Warm, dry, Skin color, texture, turgor normal, no obvious rashes or lesions Psych: No HI/SI, judgement and insight good, Euthymic mood. Full Affect.  Neurologic:   CNII-XII intact, normal strength, sensation and reflexes    Throughout

## 2018-01-22 NOTE — Patient Instructions (Addendum)
Due for repeat pap smear March of 2021.  Colonoscopy due July of 2021.  Please bring BP log to your follow up appointment in near future.   Preventive Care for Adults, Female  A healthy lifestyle and preventive care can promote health and wellness. Preventive health guidelines for women include the following key practices.   A routine yearly physical is a good way to check with your health care provider about your health and preventive screening. It is a chance to share any concerns and updates on your health and to receive a thorough exam.   Visit your dentist for a routine exam and preventive care every 6 months. Brush your teeth twice a day and floss once a day. Good oral hygiene prevents tooth decay and gum disease.   The frequency of eye exams is based on your age, health, family medical history, use of contact lenses, and other factors. Follow your health care provider's recommendations for frequency of eye exams.   Eat a healthy diet. Foods like vegetables, fruits, whole grains, low-fat dairy products, and lean protein foods contain the nutrients you need without too many calories. Decrease your intake of foods high in solid fats, added sugars, and salt. Eat the right amount of calories for you.Get information about a proper diet from your health care provider, if necessary.   Regular physical exercise is one of the most important things you can do for your health. Most adults should get at least 150 minutes of moderate-intensity exercise (any activity that increases your heart rate and causes you to sweat) each week. In addition, most adults need muscle-strengthening exercises on 2 or more days a week.   Maintain a healthy weight. The body mass index (BMI) is a screening tool to identify possible weight problems. It provides an estimate of body fat based on height and weight. Your health care provider can find your BMI, and can help you achieve or maintain a healthy weight.For  adults 20 years and older:   - A BMI below 18.5 is considered underweight.   - A BMI of 18.5 to 24.9 is normal.   - A BMI of 25 to 29.9 is considered overweight.   - A BMI of 30 and above is considered obese.   Maintain normal blood lipids and cholesterol levels by exercising and minimizing your intake of trans and saturated fats.  Eat a balanced diet with plenty of fruit and vegetables. Blood tests for lipids and cholesterol should begin at age 62 and be repeated every 5 years minimum.  If your lipid or cholesterol levels are high, you are over 40, or you are at high risk for heart disease, you may need your cholesterol levels checked more frequently.Ongoing high lipid and cholesterol levels should be treated with medicines if diet and exercise are not working.   If you smoke, find out from your health care provider how to quit. If you do not use tobacco, do not start.   Lung cancer screening is recommended for adults aged 95-80 years who are at high risk for developing lung cancer because of a history of smoking. A yearly low-dose CT scan of the lungs is recommended for people who have at least a 30-pack-year history of smoking and are a current smoker or have quit within the past 15 years. A pack year of smoking is smoking an average of 1 pack of cigarettes a day for 1 year (for example: 1 pack a day for 30 years or 2 packs a  day for 15 years). Yearly screening should continue until the smoker has stopped smoking for at least 15 years. Yearly screening should be stopped for people who develop a health problem that would prevent them from having lung cancer treatment.   If you are pregnant, do not drink alcohol. If you are breastfeeding, be very cautious about drinking alcohol. If you are not pregnant and choose to drink alcohol, do not have more than 1 drink per day. One drink is considered to be 12 ounces (355 mL) of beer, 5 ounces (148 mL) of wine, or 1.5 ounces (44 mL) of liquor.   Avoid  use of street drugs. Do not share needles with anyone. Ask for help if you need support or instructions about stopping the use of drugs.   High blood pressure causes heart disease and increases the risk of stroke. Your blood pressure should be checked at least yearly.  Ongoing high blood pressure should be treated with medicines if weight loss and exercise do not work.   If you are 17-98 years old, ask your health care provider if you should take aspirin to prevent strokes.   Diabetes screening involves taking a blood sample to check your fasting blood sugar level. This should be done once every 3 years, after age 52, if you are within normal weight and without risk factors for diabetes. Testing should be considered at a younger age or be carried out more frequently if you are overweight and have at least 1 risk factor for diabetes.   Breast cancer screening is essential preventive care for women. You should practice "breast self-awareness."  This means understanding the normal appearance and feel of your breasts and may include breast self-examination.  Any changes detected, no matter how small, should be reported to a health care provider.  Women in their 45s and 30s should have a clinical breast exam (CBE) by a health care provider as part of a regular health exam every 1 to 3 years.  After age 12, women should have a CBE every year.  Starting at age 28, women should consider having a mammogram (breast X-ray test) every year.  Women who have a family history of breast cancer should talk to their health care provider about genetic screening.  Women at a high risk of breast cancer should talk to their health care providers about having an MRI and a mammogram every year.   -Breast cancer gene (BRCA)-related cancer risk assessment is recommended for women who have family members with BRCA-related cancers. BRCA-related cancers include breast, ovarian, tubal, and peritoneal cancers. Having family members  with these cancers may be associated with an increased risk for harmful changes (mutations) in the breast cancer genes BRCA1 and BRCA2. Results of the assessment will determine the need for genetic counseling and BRCA1 and BRCA2 testing.   The Pap test is a screening test for cervical cancer. A Pap test can show cell changes on the cervix that might become cervical cancer if left untreated. A Pap test is a procedure in which cells are obtained and examined from the lower end of the uterus (cervix).   - Women should have a Pap test starting at age 7.   - Between ages 62 and 80, Pap tests should be repeated every 2 years.   - Beginning at age 59, you should have a Pap test every 3 years as long as the past 3 Pap tests have been normal.   - Some women have medical problems that  increase the chance of getting cervical cancer. Talk to your health care provider about these problems. It is especially important to talk to your health care provider if a new problem develops soon after your last Pap test. In these cases, your health care provider may recommend more frequent screening and Pap tests.   - The above recommendations are the same for women who have or have not gotten the vaccine for human papillomavirus (HPV).   - If you had a hysterectomy for a problem that was not cancer or a condition that could lead to cancer, then you no longer need Pap tests. Even if you no longer need a Pap test, a regular exam is a good idea to make sure no other problems are starting.   - If you are between ages 80 and 9 years, and you have had normal Pap tests going back 10 years, you no longer need Pap tests. Even if you no longer need a Pap test, a regular exam is a good idea to make sure no other problems are starting.   - If you have had past treatment for cervical cancer or a condition that could lead to cancer, you need Pap tests and screening for cancer for at least 20 years after your treatment.   - If Pap  tests have been discontinued, risk factors (such as a new sexual partner) need to be reassessed to determine if screening should be resumed.   - The HPV test is an additional test that may be used for cervical cancer screening. The HPV test looks for the virus that can cause the cell changes on the cervix. The cells collected during the Pap test can be tested for HPV. The HPV test could be used to screen women aged 90 years and older, and should be used in women of any age who have unclear Pap test results. After the age of 62, women should have HPV testing at the same frequency as a Pap test.   Colorectal cancer can be detected and often prevented. Most routine colorectal cancer screening begins at the age of 36 years and continues through age 41 years. However, your health care provider may recommend screening at an earlier age if you have risk factors for colon cancer. On a yearly basis, your health care provider may provide home test kits to check for hidden blood in the stool.  Use of a small camera at the end of a tube, to directly examine the colon (sigmoidoscopy or colonoscopy), can detect the earliest forms of colorectal cancer. Talk to your health care provider about this at age 41, when routine screening begins. Direct exam of the colon should be repeated every 5 -10 years through age 30 years, unless early forms of pre-cancerous polyps or small growths are found.   People who are at an increased risk for hepatitis B should be screened for this virus. You are considered at high risk for hepatitis B if:  -You were born in a country where hepatitis B occurs often. Talk with your health care provider about which countries are considered high risk.  - Your parents were born in a high-risk country and you have not received a shot to protect against hepatitis B (hepatitis B vaccine).  - You have HIV or AIDS.  - You use needles to inject street drugs.  - You live with, or have sex with, someone who  has Hepatitis B.  - You get hemodialysis treatment.  - You take certain  medicines for conditions like cancer, organ transplantation, and autoimmune conditions.   Hepatitis C blood testing is recommended for all people born from [birth year] through 1965 and any individual with known risks for hepatitis C.   Practice safe sex. Use condoms and avoid high-risk sexual practices to reduce the spread of sexually transmitted infections (STIs). STIs include gonorrhea, chlamydia, syphilis, trichomonas, herpes, HPV, and human immunodeficiency virus (HIV). Herpes, HIV, and HPV are viral illnesses that have no cure. They can result in disability, cancer, and death. Sexually active women aged 15 years and younger should be checked for chlamydia. Older women with new or multiple partners should also be tested for chlamydia. Testing for other STIs is recommended if you are sexually active and at increased risk.   Osteoporosis is a disease in which the bones lose minerals and strength with aging. This can result in serious bone fractures or breaks. The risk of osteoporosis can be identified using a bone density scan. Women ages 82 years and over and women at risk for fractures or osteoporosis should discuss screening with their health care providers. Ask your health care provider whether you should take a calcium supplement or vitamin D to There are also several preventive steps women can take to avoid osteoporosis and resulting fractures or to keep osteoporosis from worsening. -->Recommendations include:  Eat a balanced diet high in fruits, vegetables, calcium, and vitamins.  Get enough calcium. The recommended total intake of is 1,200 mg daily; for best absorption, if taking supplements, divide doses into 250-500 mg doses throughout the day. Of the two types of calcium, calcium carbonate is best absorbed when taken with food but calcium citrate can be taken on an empty stomach.  Get enough vitamin D. [employer] and the  [employer] recommend at least 1,000 IU per day for women age 43 and over who are at risk of vitamin D deficiency. Vitamin D deficiency can be caused by inadequate sun exposure (for example, those who live in [place]).  Avoid alcohol and smoking. Heavy alcohol intake (more than 7 drinks per week) increases the risk of falls and hip fracture and women smokers tend to lose bone more rapidly and have lower bone mass than nonsmokers. Stopping smoking is one of the most important changes women can make to improve their health and decrease risk for disease.  Be physically active every day. Weight-bearing exercise (for example, fast walking, hiking, jogging, and weight training) may strengthen bones or slow the rate of bone loss that comes with aging. Balancing and muscle-strengthening exercises can reduce the risk of falling and fracture.  Consider therapeutic medications. Currently, several types of effective drugs are available. Healthcare providers can recommend the type most appropriate for each woman.  Eliminate environmental factors that may contribute to accidents. Falls cause nearly 90% of all osteoporotic fractures, so reducing this risk is an important bone-health strategy. Measures include ample lighting, removing obstructions to walking, using nonskid rugs on floors, and placing mats and/or grab bars in showers.  Be aware of medication side effects. Some common medicines make bones weaker. These include a type of steroid drug called glucocorticoids used for arthritis and asthma, some antiseizure drugs, certain sleeping pills, treatments for endometriosis, and some cancer drugs. An overactive thyroid gland or using too much thyroid hormone for an underactive thyroid can also be a problem. If you are taking these medicines, talk to your doctor about what you can do to help protect your bones.reduce the rate of osteoporosis.  Menopause can be associated with  physical symptoms and risks. Hormone replacement therapy is available to decrease symptoms and risks. You should talk to your health care provider about whether hormone replacement therapy is right for you.   Use sunscreen. Apply sunscreen liberally and repeatedly throughout the day. You should seek shade when your shadow is shorter than you. Protect yourself by wearing long sleeves, pants, a wide-brimmed hat, and sunglasses year round, whenever you are outdoors.   Once a month, do a whole body skin exam, using a mirror to look at the skin on your back. Tell your health care provider of new moles, moles that have irregular borders, moles that are larger than a pencil eraser, or moles that have changed in shape or color.   -Stay current with required vaccines (immunizations).   Influenza vaccine. All adults should be immunized every year.  Tetanus, diphtheria, and acellular pertussis (Td, Tdap) vaccine. Pregnant women should receive 1 dose of Tdap vaccine during each pregnancy. The dose should be obtained regardless of the length of time since the last dose. Immunization is preferred during the 27th 36th week of gestation. An adult who has not previously received Tdap or who does not know her vaccine status should receive 1 dose of Tdap. This initial dose should be followed by tetanus and diphtheria toxoids (Td) booster doses every 10 years. Adults with an unknown or incomplete history of completing a 3-dose immunization series with Td-containing vaccines should begin or complete a primary immunization series including a Tdap dose. Adults should receive a Td booster every 10 years.  Varicella vaccine. An adult without evidence of immunity to varicella should receive 2 doses or a second dose if she has previously received 1 dose. Pregnant females who do not have evidence of immunity should receive the first dose after pregnancy. This first dose should be obtained before leaving the health care  facility. The second dose should be obtained 4 8 weeks after the first dose.  Human papillomavirus (HPV) vaccine. Females aged 40 26 years who have not received the vaccine previously should obtain the 3-dose series. The vaccine is not recommended for use in pregnant females. However, pregnancy testing is not needed before receiving a dose. If a female is found to be pregnant after receiving a dose, no treatment is needed. In that case, the remaining doses should be delayed until after the pregnancy. Immunization is recommended for any person with an immunocompromised condition through the age of 4 years if she did not get any or all doses earlier. During the 3-dose series, the second dose should be obtained 4 8 weeks after the first dose. The third dose should be obtained 24 weeks after the first dose and 16 weeks after the second dose.  Zoster vaccine. One dose is recommended for adults aged 30 years or older unless certain conditions are present.  Measles, mumps, and rubella (MMR) vaccine. Adults born before [birth year] generally are considered immune to measles and mumps. Adults born in [birth year] or later should have 1 or more doses of MMR vaccine unless there is a contraindication to the vaccine or there is laboratory evidence of immunity to each of the three diseases. A routine second dose of MMR vaccine should be obtained at least 28 days after the first dose for students attending postsecondary schools, health care workers, or international travelers. People who received inactivated measles vaccine or an unknown type of measles vaccine during 1963 1967 should receive 2 doses of MMR vaccine. People who  received inactivated mumps vaccine or an unknown type of mumps vaccine before 1979 and are at high risk for mumps infection should consider immunization with 2 doses of MMR vaccine. For females of childbearing age, rubella immunity should be determined. If there is no evidence of immunity, females who are not  pregnant should be vaccinated. If there is no evidence of immunity, females who are pregnant should delay immunization until after pregnancy. Unvaccinated health care workers born before [birth year] who lack laboratory evidence of measles, mumps, or rubella immunity or laboratory confirmation of disease should consider measles and mumps immunization with 2 doses of MMR vaccine or rubella immunization with 1 dose of MMR vaccine.  Pneumococcal 13-valent conjugate (PCV13) vaccine. When indicated, a person who is uncertain of her immunization history and has no record of immunization should receive the PCV13 vaccine. An adult aged 67 years or older who has certain medical conditions and has not been previously immunized should receive 1 dose of PCV13 vaccine. This PCV13 should be followed with a dose of pneumococcal polysaccharide (PPSV23) vaccine. The PPSV23 vaccine dose should be obtained at least 8 weeks after the dose of PCV13 vaccine. An adult aged 28 years or older who has certain medical conditions and previously received 1 or more doses of PPSV23 vaccine should receive 1 dose of PCV13. The PCV13 vaccine dose should be obtained 1 or more years after the last PPSV23 vaccine dose.  Pneumococcal polysaccharide (PPSV23) vaccine. When PCV13 is also indicated, PCV13 should be obtained first. All adults aged 53 years and older should be immunized. An adult younger than age 8 years who has certain medical conditions should be immunized. Any person who resides in a nursing home or long-term care facility should be immunized. An adult smoker should be immunized. People with an immunocompromised condition and certain other conditions should receive both PCV13 and PPSV23 vaccines. People with human immunodeficiency virus (HIV) infection should be immunized as soon as possible after diagnosis. Immunization during chemotherapy or radiation therapy should be avoided. Routine use of PPSV23 vaccine is not recommended for [place]  [place]ians, [place]atives, or people younger than 65 years unless there are medical conditions that require PPSV23 vaccine. When indicated, people who have unknown immunization and have no record of immunization should receive PPSV23 vaccine. One-time revaccination 5 years after the first dose of PPSV23 is recommended for people aged 66 64 years who have chronic kidney failure, nephrotic syndrome, asplenia, or immunocompromised conditions. People who received 1 2 doses of PPSV23 before age 44 years should receive another dose of PPSV23 vaccine at age 42 years or later if at least 5 years have passed since the previous dose. Doses of PPSV23 are not needed for people immunized with PPSV23 at or after age 82 years.  Meningococcal vaccine. Adults with asplenia or persistent complement component deficiencies should receive 2 doses of quadrivalent meningococcal conjugate (MenACWY-D) vaccine. The doses should be obtained at least 2 months apart. Microbiologists working with certain meningococcal bacteria, [place] recruits, people at risk during an outbreak, and people who travel to or live in countries with a high rate of meningitis should be immunized. A first-year college student up through age 63 years who is living in a residence hall should receive a dose if she did not receive a dose on or after her 16th birthday. Adults who have certain high-risk conditions should receive one or more doses of vaccine.  Hepatitis A vaccine. Adults who wish to be protected from this disease, have certain high-risk  conditions, work with hepatitis A-infected animals, work in hepatitis A research labs, or travel to or work in countries with a high rate of hepatitis A should be immunized. Adults who were previously unvaccinated and who anticipate close contact with an international adoptee during the first 60 days after arrival in the [place] from a country with a high rate of hepatitis A should be immunized.  Hepatitis B  vaccine.  Adults who wish to be protected from this disease, have certain high-risk conditions, may be exposed to blood or other infectious body fluids, are household contacts or sex partners of hepatitis B positive people, are clients or workers in certain care facilities, or travel to or work in countries with a high rate of hepatitis B should be immunized.  Haemophilus influenzae type b (Hib) vaccine. A previously unvaccinated person with asplenia or sickle cell disease or having a scheduled splenectomy should receive 1 dose of Hib vaccine. Regardless of previous immunization, a recipient of a hematopoietic stem cell transplant should receive a 3-dose series 6 12 months after her successful transplant. Hib vaccine is not recommended for adults with HIV infection.  Preventive Services / Frequency Ages 57 to 39years  Blood pressure check.** / Every 1 to 2 years.  Lipid and cholesterol check.** / Every 5 years beginning at age 69.  Clinical breast exam.** / Every 3 years for women in their 65s and 90s.  BRCA-related cancer risk assessment.** / For women who have family members with a BRCA-related cancer (breast, ovarian, tubal, or peritoneal cancers).  Pap test.** / Every 2 years from ages 26 through 31. Every 3 years starting at age 74 through age 49 or 58 with a history of 3 consecutive normal Pap tests.  HPV screening.** / Every 3 years from ages 47 through ages 14 to 26 with a history of 3 consecutive normal Pap tests.  Hepatitis C blood test.** / For any individual with known risks for hepatitis C.  Skin self-exam. / Monthly.  Influenza vaccine. / Every year.  Tetanus, diphtheria, and acellular pertussis (Tdap, Td) vaccine.** / Consult your health care provider. Pregnant women should receive 1 dose of Tdap vaccine during each pregnancy. 1 dose of Td every 10 years.  Varicella vaccine.** / Consult your health care provider. Pregnant females who do not have evidence of immunity should  receive the first dose after pregnancy.  HPV vaccine. / 3 doses over 6 months, if 67 and younger. The vaccine is not recommended for use in pregnant females. However, pregnancy testing is not needed before receiving a dose.  Measles, mumps, rubella (MMR) vaccine.** / You need at least 1 dose of MMR if you were born in 1957 or later. You may also need a 2nd dose. For females of childbearing age, rubella immunity should be determined. If there is no evidence of immunity, females who are not pregnant should be vaccinated. If there is no evidence of immunity, females who are pregnant should delay immunization until after pregnancy.  Pneumococcal 13-valent conjugate (PCV13) vaccine.** / Consult your health care provider.  Pneumococcal polysaccharide (PPSV23) vaccine.** / 1 to 2 doses if you smoke cigarettes or if you have certain conditions.  Meningococcal vaccine.** / 1 dose if you are age 9 to 45 years and a Market researcher living in a residence hall, or have one of several medical conditions, you need to get vaccinated against meningococcal disease. You may also need additional booster doses.  Hepatitis A vaccine.** / Consult your health care provider.  Hepatitis B vaccine.** / Consult your health care provider.  Haemophilus influenzae type b (Hib) vaccine.** / Consult your health care provider.  Ages 33 to 64years  Blood pressure check.** / Every 1 to 2 years.  Lipid and cholesterol check.** / Every 5 years beginning at age 70 years.  Lung cancer screening. / Every year if you are aged 23 80 years and have a 30-pack-year history of smoking and currently smoke or have quit within the past 15 years. Yearly screening is stopped once you have quit smoking for at least 15 years or develop a health problem that would prevent you from having lung cancer treatment.  Clinical breast exam.** / Every year after age 61 years.  BRCA-related cancer risk assessment.** / For women who have  family members with a BRCA-related cancer (breast, ovarian, tubal, or peritoneal cancers).  Mammogram.** / Every year beginning at age 33 years and continuing for as long as you are in good health. Consult with your health care provider.  Pap test.** / Every 3 years starting at age 17 years through age 45 or 43 years with a history of 3 consecutive normal Pap tests.  HPV screening.** / Every 3 years from ages 43 years through ages 84 to 49 years with a history of 3 consecutive normal Pap tests.  Fecal occult blood test (FOBT) of stool. / Every year beginning at age 74 years and continuing until age 25 years. You may not need to do this test if you get a colonoscopy every 10 years.  Flexible sigmoidoscopy or colonoscopy.** / Every 5 years for a flexible sigmoidoscopy or every 10 years for a colonoscopy beginning at age 47 years and continuing until age 23 years.  Hepatitis C blood test.** / For all people born from [birth year] through 1963 and any individual with known risks for hepatitis C.  Skin self-exam. / Monthly.  Influenza vaccine. / Every year.  Tetanus, diphtheria, and acellular pertussis (Tdap/Td) vaccine.** / Consult your health care provider. Pregnant women should receive 1 dose of Tdap vaccine during each pregnancy. 1 dose of Td every 10 years.  Varicella vaccine.** / Consult your health care provider. Pregnant females who do not have evidence of immunity should receive the first dose after pregnancy.  Zoster vaccine.** / 1 dose for adults aged 46 years or older.  Measles, mumps, rubella (MMR) vaccine.** / You need at least 1 dose of MMR if you were born in 1957 or later. You may also need a 2nd dose. For females of childbearing age, rubella immunity should be determined. If there is no evidence of immunity, females who are not pregnant should be vaccinated. If there is no evidence of immunity, females who are pregnant should delay immunization until after pregnancy.  Pneumococcal  13-valent conjugate (PCV13) vaccine.** / Consult your health care provider.  Pneumococcal polysaccharide (PPSV23) vaccine.** / 1 to 2 doses if you smoke cigarettes or if you have certain conditions.  Meningococcal vaccine.** / Consult your health care provider.  Hepatitis A vaccine.** / Consult your health care provider.  Hepatitis B vaccine.** / Consult your health care provider.  Haemophilus influenzae type b (Hib) vaccine.** / Consult your health care provider.  Ages 102 years and over  Blood pressure check.** / Every 1 to 2 years.  Lipid and cholesterol check.** / Every 5 years beginning at age 82 years.  Lung cancer screening. / Every year if you are aged 63 80 years and have a 30-pack-year history of smoking and currently smoke  or have quit within the past 15 years. Yearly screening is stopped once you have quit smoking for at least 15 years or develop a health problem that would prevent you from having lung cancer treatment.  Clinical breast exam.** / Every year after age 71 years.  BRCA-related cancer risk assessment.** / For women who have family members with a BRCA-related cancer (breast, ovarian, tubal, or peritoneal cancers).  Mammogram.** / Every year beginning at age 28 years and continuing for as long as you are in good health. Consult with your health care provider.  Pap test.** / Every 3 years starting at age 40 years through age 50 or 36 years with 3 consecutive normal Pap tests. Testing can be stopped between 65 and 70 years with 3 consecutive normal Pap tests and no abnormal Pap or HPV tests in the past 10 years.  HPV screening.** / Every 3 years from ages 88 years through ages 59 or 72 years with a history of 3 consecutive normal Pap tests. Testing can be stopped between 65 and 70 years with 3 consecutive normal Pap tests and no abnormal Pap or HPV tests in the past 10 years.  Fecal occult blood test (FOBT) of stool. / Every year beginning at age 46 years and  continuing until age 34 years. You may not need to do this test if you get a colonoscopy every 10 years.  Flexible sigmoidoscopy or colonoscopy.** / Every 5 years for a flexible sigmoidoscopy or every 10 years for a colonoscopy beginning at age 43 years and continuing until age 89 years.  Hepatitis C blood test.** / For all people born from [birth year] through 1963 and any individual with known risks for hepatitis C.  Osteoporosis screening.** / A one-time screening for women ages 62 years and over and women at risk for fractures or osteoporosis.  Skin self-exam. / Monthly.  Influenza vaccine. / Every year.  Tetanus, diphtheria, and acellular pertussis (Tdap/Td) vaccine.** / 1 dose of Td every 10 years.  Varicella vaccine.** / Consult your health care provider.  Zoster vaccine.** / 1 dose for adults aged 20 years or older.  Pneumococcal 13-valent conjugate (PCV13) vaccine.** / Consult your health care provider.  Pneumococcal polysaccharide (PPSV23) vaccine.** / 1 dose for all adults aged 64 years and older.  Meningococcal vaccine.** / Consult your health care provider.  Hepatitis A vaccine.** / Consult your health care provider.  Hepatitis B vaccine.** / Consult your health care provider.  Haemophilus influenzae type b (Hib) vaccine.** / Consult your health care provider. ** Family history and personal history of risk and conditions may change your health care provider's recommendations. Document Released: 04/04/2001 Document Revised: 11/27/2012  [facility] [place] Patient Information 2014 [place], [place].   EXERCISE AND DIET:  We recommended that you start or continue a regular exercise program for good health. Regular exercise means any activity that makes your heart beat faster and makes you sweat.  We recommend exercising at least 30 minutes per day at least 3 days a week, preferably 5.  We also recommend a diet low in fat and sugar / carbohydrates.  Inactivity, poor dietary choices and obesity  can cause diabetes, heart attack, stroke, and kidney damage, among others.     ALCOHOL AND SMOKING:  Women should limit their alcohol intake to no more than 7 drinks/beers/glasses of wine (combined, not each!) per week. Moderation of alcohol intake to this level decreases your risk of breast cancer and liver damage.  ( And of course, no recreational drugs  are part of a healthy lifestyle.)  Also, you should not be smoking at all or even being exposed to second hand smoke. Most people know smoking can cause cancer, and various heart and lung diseases, but did you know it also contributes to weakening of your bones?  Aging of your skin?  Yellowing of your teeth and nails?   CALCIUM AND VITAMIN D:  Adequate intake of calcium and Vitamin D are recommended.  The recommendations for exact amounts of these supplements seem to change often, but generally speaking 600 mg of calcium (either carbonate or citrate) and 800 units of Vitamin D per day seems prudent. Certain women may benefit from higher intake of Vitamin D.  If you are among these women, your doctor will have told you during your visit.     PAP SMEARS:  Pap smears, to check for cervical cancer or precancers,  have traditionally been done yearly, although recent scientific advances have shown that most women can have pap smears less often.  However, every woman still should have a physical exam from her gynecologist or primary care physician every year. It will include a breast check, inspection of the vulva and vagina to check for abnormal growths or skin changes, a visual exam of the cervix, and then an exam to evaluate the size and shape of the uterus and ovaries.  And after 65 years of age, a rectal exam is indicated to check for rectal cancers. We will also provide age appropriate advice regarding health maintenance, like when you should have certain vaccines, screening for sexually transmitted diseases, bone density testing, colonoscopy,  mammograms, etc.    MAMMOGRAMS:  All women over 35 years old should have a yearly mammogram. Many facilities now offer a "3D" mammogram, which may cost around $50 extra out of pocket. If possible,  we recommend you accept the option to have the 3D mammogram performed.  It both reduces the number of women who will be called back for extra views which then turn out to be normal, and it is better than the routine mammogram at detecting truly abnormal areas.     COLONOSCOPY:  Colonoscopy to screen for colon cancer is recommended for all women at age 30.  We know, you hate the idea of the prep.  We agree, BUT, having colon cancer and not knowing it is worse!!  Colon cancer so often starts as a polyp that can be seen and removed at colonscopy, which can quite literally save your life!  And if your first colonoscopy is normal and you have no family history of colon cancer, most women don't have to have it again for 10 years.  Once every ten years, you can do something that may end up saving your life, right?  We will be happy to help you get it scheduled when you are ready.  Be sure to check your insurance coverage so you understand how much it will cost.  It may be covered as a preventative service at no cost, but you should check your particular policy.

## 2018-01-23 LAB — CBC WITH DIFFERENTIAL/PLATELET
BASOS: 1 %
Basophils Absolute: 0.1 10*3/uL (ref 0.0–0.2)
EOS (ABSOLUTE): 0.1 10*3/uL (ref 0.0–0.4)
EOS: 2 %
HEMOGLOBIN: 14.7 g/dL (ref 11.1–15.9)
Hematocrit: 43.1 % (ref 34.0–46.6)
IMMATURE GRANULOCYTES: 0 %
Immature Grans (Abs): 0 10*3/uL (ref 0.0–0.1)
Lymphocytes Absolute: 2.5 10*3/uL (ref 0.7–3.1)
Lymphs: 37 %
MCH: 29.5 pg (ref 26.6–33.0)
MCHC: 34.1 g/dL (ref 31.5–35.7)
MCV: 87 fL (ref 79–97)
MONOS ABS: 0.4 10*3/uL (ref 0.1–0.9)
Monocytes: 6 %
NEUTROS PCT: 54 %
Neutrophils Absolute: 3.7 10*3/uL (ref 1.4–7.0)
Platelets: 294 10*3/uL (ref 150–450)
RBC: 4.98 x10E6/uL (ref 3.77–5.28)
RDW: 13 % (ref 12.3–15.4)
WBC: 6.8 10*3/uL (ref 3.4–10.8)

## 2018-01-23 LAB — COMPREHENSIVE METABOLIC PANEL
ALBUMIN: 4.4 g/dL (ref 3.6–4.8)
ALK PHOS: 66 IU/L (ref 39–117)
ALT: 39 IU/L — ABNORMAL HIGH (ref 0–32)
AST: 32 IU/L (ref 0–40)
Albumin/Globulin Ratio: 1.8 (ref 1.2–2.2)
BUN / CREAT RATIO: 17 (ref 12–28)
BUN: 16 mg/dL (ref 8–27)
Bilirubin Total: 0.5 mg/dL (ref 0.0–1.2)
CO2: 24 mmol/L (ref 20–29)
Calcium: 10.4 mg/dL — ABNORMAL HIGH (ref 8.7–10.3)
Chloride: 99 mmol/L (ref 96–106)
Creatinine, Ser: 0.96 mg/dL (ref 0.57–1.00)
GFR calc Af Amer: 72 mL/min/{1.73_m2} (ref >59)
GFR calc non Af Amer: 63 mL/min/{1.73_m2} (ref >59)
Globulin, Total: 2.4 g/dL (ref 1.5–4.5)
Glucose: 93 mg/dL (ref 65–99)
Potassium: 4.2 mmol/L (ref 3.5–5.2)
Sodium: 141 mmol/L (ref 134–144)
Total Protein: 6.8 g/dL (ref 6.0–8.5)

## 2018-01-23 LAB — LIPID PANEL
Chol/HDL Ratio: 4.5 ratio — ABNORMAL HIGH (ref 0.0–4.4)
Cholesterol, Total: 282 mg/dL — ABNORMAL HIGH (ref 100–199)
HDL: 63 mg/dL (ref >39)
LDL Calculated: 182 mg/dL — ABNORMAL HIGH (ref 0–99)
TRIGLYCERIDES: 183 mg/dL — AB (ref 0–149)
VLDL Cholesterol Cal: 37 mg/dL (ref 5–40)

## 2018-01-23 LAB — HEMOGLOBIN A1C
ESTIMATED AVERAGE GLUCOSE: 120 mg/dL
HEMOGLOBIN A1C: 5.8 % — AB (ref 4.8–5.6)

## 2018-01-23 LAB — VITAMIN D 25 HYDROXY (VIT D DEFICIENCY, FRACTURES): Vit D, 25-Hydroxy: 30.2 ng/mL (ref 30.0–100.0)

## 2018-01-23 LAB — TSH: TSH: 1.64 u[IU]/mL (ref 0.450–4.500)

## 2018-01-23 LAB — T4, FREE: Free T4: 1.17 ng/dL (ref 0.82–1.77)

## 2018-01-30 ENCOUNTER — Ambulatory Visit: Payer: 59 | Admitting: Family Medicine

## 2018-01-30 ENCOUNTER — Encounter: Payer: Self-pay | Admitting: Family Medicine

## 2018-01-30 VITALS — BP 142/90 | HR 83 | Temp 98.4°F | Ht 65.25 in | Wt 168.9 lb

## 2018-01-30 DIAGNOSIS — E781 Pure hyperglyceridemia: Secondary | ICD-10-CM | POA: Diagnosis not present

## 2018-01-30 DIAGNOSIS — L299 Pruritus, unspecified: Secondary | ICD-10-CM

## 2018-01-30 DIAGNOSIS — E785 Hyperlipidemia, unspecified: Secondary | ICD-10-CM

## 2018-01-30 DIAGNOSIS — R7303 Prediabetes: Secondary | ICD-10-CM | POA: Diagnosis not present

## 2018-01-30 DIAGNOSIS — I10 Essential (primary) hypertension: Secondary | ICD-10-CM | POA: Diagnosis not present

## 2018-01-30 DIAGNOSIS — R7302 Impaired glucose tolerance (oral): Secondary | ICD-10-CM

## 2018-01-30 DIAGNOSIS — E559 Vitamin D deficiency, unspecified: Secondary | ICD-10-CM

## 2018-01-30 MED ORDER — LOSARTAN POTASSIUM-HCTZ 50-12.5 MG PO TABS: 1.0000 | ORAL_TABLET | Freq: Every day | ORAL | 3 refills | Status: DC

## 2018-01-30 MED ORDER — VITAMIN D (ERGOCALCIFEROL) 1.25 MG (50000 UNIT) PO CAPS: 50000.0000 [IU] | ORAL_CAPSULE | ORAL | 3 refills | Status: DC

## 2018-01-30 MED ORDER — ATORVASTATIN CALCIUM 20 MG PO TABS: 20.0000 mg | ORAL_TABLET | Freq: Every day | ORAL | 3 refills | Status: DC

## 2018-01-30 NOTE — Progress Notes (Signed)
Assessment and plan:  1. Essential hypertension   2. Hyperlipidemia, unspecified hyperlipidemia type-10-year risk greater than 10%   3. Hypertriglyceridemia   4. Prediabetes   5. Glucose intolerance (impaired glucose tolerance)   6. Vitamin D deficiency   7. Ear itching b/l      - Extensively discussed policies and practiices here at the clinic, and answered all questions about care team and health management during appointment.  1. Hypertension - BP elevated at this time, suboptimally controlled. - Pt formerly managed on BP medication and diuretic.   - Reviewed combination approach to treating BP with medication. - Begin medication today.  See med list below.  - Lifestyle changes such as dash diet and engaging in a regular exercise program discussed with patient.  Educational handouts provided.  - Ambulatory BP monitoring encouraged. Keep log and bring in next OV.  2. Acute Bilateral Itching in Ears - Patient requested examination of ears in office today. - No abnormalities found. - Referral placed to Otolaryngology today.  3. Reviewed recent lab work (01/22/2018) in depth with patient today.  All lab work within normal limits unless otherwise noted.  - Extensively reviewed the importance of prudent eating habits, adequate hydration, and regular exercise/physical activity to preserve organ health.  4. Slightly Elevated Calcium Value - 10.4 - Slightly elevated calcium value.    - Per pt, she has been taking increased amounts of Tums to mitigate sx since she's been eating more poorly.    - Advised patient to discontinue use of Tums and resume more prudent eating habits, watching her food intake.  5. Elevated ALT - 39 - Per pt, she drinks only one glass of wine per weeknight, maybe two on weekend nights. - Advised patient to help minimize  - Re-check in 6 weeks.  6. Vitamin D Insufficiency - 30.2 last  check. - Begin once weekly Vitamin D.  7. Prediabetes - HbA1c of 5.8 - Counseled patient on prevention of disease and discussed prudent dietary and lifestyle modifications as first line.  Importance of low carb/ketogenic diet discussed with patient in addition to regular exercise.   - Handouts provided today.  8. Lipid Panel Triglycerides = elevated, 183 HDL =  63 LDL = elevated, 182  - Lipitor started today.  Per patient, was managed on statins before.  Notes she had tingling in her legs.  Tried Crestor, Pravachol, and Mevacor.  - Reviewed critical importance of adequate hydration and physical activity to minimize incidence of side effects.  - Re-check liver enzymes in 6 weeks.  The 10-year ASCVD risk score Mikey Bussing DC Brooke Bonito., et al., 2013) is: 10.7%   Values used to calculate the score:     Age: 54 years     Sex: Female     Is Non-Hispanic African American: No     Diabetic: No     Tobacco smoker: No     Systolic Blood Pressure: 627 mmHg     Is BP treated: Yes     HDL Cholesterol: 63 mg/dL     Total Cholesterol: 282 mg/dL  - Dietary changes such as low saturated & trans fat and low carb/ ketogenic diets discussed with patient.  Encouraged regular exercise and weight loss when appropriate.    Education and routine counseling performed. Handouts provided.  9. BMI Counseling Explained to patient what BMI refers to, and what it means medically.    Told patient to think about it as a "medical risk stratification  measurement" and how increasing BMI is associated with increasing risk/ or worsening state of various diseases such as hypertension, hyperlipidemia, diabetes, premature OA, depression etc.  American Heart Association guidelines for healthy diet, basically Mediterranean diet, and exercise guidelines of 30 minutes 5 days per week or more discussed in detail.  Health counseling performed.  All questions answered.  10. Lifestyle & Preventative Health Maintenance - Advised  patient to continue working toward exercising to improve overall mental, physical, and emotional health.    - Reviewed the "spokes of the wheel" of mood and health management.  Stressed the importance of ongoing prudent habits, including regular exercise, appropriate sleep hygiene, healthful dietary habits, and prayer/meditation to relax.  - Encouraged patient to engage in daily physical activity, especially a formal exercise routine.  Recommended that the patient eventually strive for at least 150 minutes of moderate cardiovascular activity per week according to guidelines established by the [facility].   - Healthy dietary habits encouraged, including low-carb, and high amounts of lean protein in diet.   - Patient should also consume adequate amounts of water.  Pt was interviewed and evaluated by me in the clinic today for 32.5+ minutes, with over 50% time spent in face to face counseling of patients various medical conditions, treatment plans of those medical conditions including medicine management and lifestyle modification, strategies to improve health and well being; and in coordination of care. SEE ABOVE TREATMENT PLAN FOR DETAILS    Orders Placed This Encounter  Procedures  . Ambulatory referral to ENT    Meds ordered this encounter  Medications  . Vitamin D, Ergocalciferol, (DRISDOL) 1.25 MG (50000 UT) CAPS capsule    Sig: Take 1 capsule (50,000 Units total) by mouth every 7 (seven) days.    Dispense:  12 capsule    Refill:  3  . losartan-hydrochlorothiazide (HYZAAR) 50-12.5 MG tablet    Sig: Take 1 tablet by mouth daily.    Dispense:  90 tablet    Refill:  3  . atorvastatin (LIPITOR) 20 MG tablet    Sig: Take 1 tablet (20 mg total) by mouth at bedtime.    Dispense:  90 tablet    Refill:  3    Medications Discontinued During This Encounter  Medication Reason  . lisinopril (PRINIVIL,ZESTRIL) 10 MG tablet   . hydrochlorothiazide (HYDRODIURIL) 25 MG tablet       Return for  6-8 wks - BMP, B12 and ALT, then OV with me 1 week later.   Anticipatory guidance and routine counseling done re: condition, txmnt options and need for follow up. All questions of patient's were answered.   Gross side effects, risk and benefits, and alternatives of medications discussed with patient.  Patient is aware that all medications have potential side effects and we are unable to predict every sideeffect or drug-drug interaction that may occur.  Expresses verbal understanding and consents to current therapy plan and treatment regiment.  Please see AVS handed out to patient at the end of our visit for additional patient instructions/ counseling done pertaining to today's office visit.  Note:  This document was prepared using Dragon voice recognition software and may include unintentional dictation errors.    This document serves as a record of services personally performed by Mellody Dance, DO. It was created on her behalf by Toni Amend, a trained medical scribe. The creation of this record is based on the scribe's personal observations and the provider's statements to them.   I have reviewed the above medical  documentation for accuracy and completeness and I concur.  Mellody Dance, DO  ----------------------------------------------------------------------------------------------  Subjective:   CC:   Dana Hodge is a 67 y.o. female who presents to [place] [facility] today for review and discussion of recent bloodwork that was done in addition to f/up on chronic conditions we are managing for pt.  1. All recent blood work that we ordered was reviewed with patient today.  Patient was counseled on all abnormalities and we discussed dietary and lifestyle changes that could help those values (also medications when appropriate).  Extensive health counseling performed and all patient's concerns/ questions were addressed.  See labs below and also plan for more  details of these abnormalities.  Family History of CAD States her father died of a heart attack at age 30. Mother had a triple bypass in her 75's.  Patient formerly managed on statins Notes that she used to take "something that ended with statin," and experienced tingling in her legs.  States "I couldn't get to sleep and that was the worst part."  Alcohol Intake Habits Typically has one glass of wine per day.  Denies taking tylenol.  Eating Habits Notes she's been "on and off" of the keto diet.  Itching in Ears Patient notes she still has itching ears "and they're driving me crazy."  Her husband wondered if her going back on the diuretic had anything to do with her ears.  Notes she's tried mineral oil, olive oil, lotion, to alleviate her itching.  She has not been taking anything like benadryl to alleviate her itch.  1. HTN HPI:  -  Her blood pressure has not been controlled at home.  Pt has been checking it at home.   At home, her lowest blood pressure was 132/90, and her highest was 159/93.  In general, it runs in the 140's over low 90's.  - Is not managed on medication at this time.  Notes she was taking a diuretic again per recommendation from another doctor, but it is now "out."  Notes that she is not taking any blood pressure medication at present.   - Smoking Status noted; never smoker.  - She denies new onset of: chest pain, exercise intolerance, shortness of breath, dizziness, visual changes, headache, lower extremity swelling or claudication.   Last 3 blood pressure readings in our office are as follows: BP Readings from Last 3 Encounters:  01/30/18 (!) 142/90  01/22/18 (!) 136/94  10/17/17 117/79    Filed Weights   01/30/18 0848  Weight: 168 lb 14.4 oz (76.6 kg)     Wt Readings from Last 3 Encounters:  01/30/18 168 lb 14.4 oz (76.6 kg)  01/22/18 168 lb (76.2 kg)  10/17/17 174 lb 4.8 oz (79.1 kg)   BP Readings from Last 3 Encounters:  01/30/18 (!) 142/90    01/22/18 (!) 136/94  10/17/17 117/79   Pulse Readings from Last 3 Encounters:  01/30/18 83  01/22/18 76  10/17/17 80   BMI Readings from Last 3 Encounters:  01/30/18 27.89 kg/m  01/22/18 27.74 kg/m  10/17/17 29.01 kg/m     Patient Care Team    Relationship Specialty Notifications Start End  Mellody Dance, DO PCP - General Family Medicine  10/17/17     Full medical history updated and reviewed in the office today  Patient Active Problem List   Diagnosis Date Noted  . Hyperlipidemia 01/12/2009    Priority: High  . Essential hypertension 01/12/2009  Priority: High  . Prediabetes 01/30/2018  . Glucose intolerance (impaired glucose tolerance) 01/30/2018  . Hypertriglyceridemia 01/30/2018  . Noncompliance 10/17/2017  . B12 deficiency 10/17/2017  . Vitamin D deficiency 10/17/2017  . Hypomagnesemia 10/17/2017  . Abdominal abscesses x3 s/p lap LOA & drainage 01/15/2016 01/15/2016  . Hypokalemia 01/14/2016  . Acute appendicitis with perforation and peritoneal abscess s/p lap appendectomy 01/15/2016 01/05/2016  . Esophageal reflux 03/26/2015  . 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 Chicken pox   . Colon polyp   . Hyperlipidemia   . Hypertension   . UTI (urinary tract infection)     Past Surgical History:  Procedure Laterality Date  . LAPAROSCOPIC APPENDECTOMY N/A 01/15/2016   Procedure: APPENDECTOMY LAPAROSCOPIC. DRAINAGE OF INTRA ABDOMINAL ABSCESSES X 3, LYSIS OF ADHESIONS;  Surgeon: Michael Boston, MD;  Location: [facility];  Service: General;  Laterality: N/A;    Social History   Tobacco Use  . Smoking status: Never Smoker  . Smokeless tobacco: Never Used  Substance Use Topics  . Alcohol use: Yes    Comment: a glass of wine with dinner     Family Hx: Family History  Problem Relation Age of Onset  . Heart disease Mother 14       CAD  . Hypertension Mother   . Hyperlipidemia  Mother   . Heart disease Father 54       MI  . COPD Father   . Cancer Maternal Uncle        colon, pancreatic  . Cancer Maternal Grandfather        colon, pancreatic  . Hypertension Sister   . Alcoholism Paternal Uncle   . Stroke Maternal Grandmother      Medications: Current Outpatient Medications  Medication Sig Dispense Refill  . aspirin 81 MG tablet Take 81 mg by mouth daily.    . Cholecalciferol (VITAMIN D3) 1000 units CAPS Take 1 capsule by mouth daily.    Marland Kitchen MAGNESIUM PO Take 250 mg by mouth 2 (two) times daily.     . vitamin B-12 (CYANOCOBALAMIN) 1000 MCG tablet Take 1,000 mcg by mouth daily.    Marland Kitchen atorvastatin (LIPITOR) 20 MG tablet Take 1 tablet (20 mg total) by mouth at bedtime. 90 tablet 3  . hydrochlorothiazide (MICROZIDE) 12.5 MG capsule Take 1 capsule (12.5 mg total) by mouth daily. 90 capsule 1  . losartan (COZAAR) 50 MG tablet Take 1 tablet (50 mg total) by mouth daily. 90 tablet 1  . losartan-hydrochlorothiazide (HYZAAR) 50-12.5 MG tablet Take 1 tablet by mouth daily. 90 tablet 3  . Vitamin D, Ergocalciferol, (DRISDOL) 1.25 MG (50000 UT) CAPS capsule Take 1 capsule (50,000 Units total) by mouth every 7 (seven) days. 12 capsule 3   No current facility-administered medications for this visit.     Allergies:  No Known Allergies   Review of Systems: General:   No F/C, wt loss Pulm:   No DIB, SOB, pleuritic chest pain Card:  No CP, palpitations Abd:  No n/v/d or pain Ext:  No inc edema from baseline  Objective:  Blood pressure (!) 142/90, pulse 83, temperature 98.4 F (36.9 C), height 5' 5.25" (1.657 m), weight 168 lb 14.4 oz (76.6 kg), SpO2 100 %. Body mass index is 27.89 kg/m. Gen:   Well NAD, A and O *3 HEENT:    [place]/AT, EOMI,  MMM Lungs:   Normal work of breathing. CTA B/L, no  Wh, rhonchi Heart:   RRR, S1, S2 WNL's, no MRG Abd:   No gross distention Exts:    warm, pink,  Brisk capillary refill, warm and well perfused.  Psych:    No HI/SI, judgement  and insight good, Euthymic mood. Full Affect.   Recent Results (from the past 2160 hour(s))  CBC with Differential/Platelet     Status: None   Collection Time: 01/22/18 10:03 AM  Result Value Ref Range   WBC 6.8 3.4 - 10.8 x10E3/uL   RBC 4.98 3.77 - 5.28 x10E6/uL   Hemoglobin 14.7 11.1 - 15.9 g/dL   Hematocrit 43.1 34.0 - 46.6 %   MCV 87 79 - 97 fL   MCH 29.5 26.6 - 33.0 pg   MCHC 34.1 31.5 - 35.7 g/dL   RDW 13.0 12.3 - 15.4 %   Platelets 294 150 - 450 x10E3/uL   Neutrophils 54 Not Estab. %   Lymphs 37 Not Estab. %   Monocytes 6 Not Estab. %   Eos 2 Not Estab. %   Basos 1 Not Estab. %   Neutrophils Absolute 3.7 1.4 - 7.0 x10E3/uL   Lymphocytes Absolute 2.5 0.7 - 3.1 x10E3/uL   Monocytes Absolute 0.4 0.1 - 0.9 x10E3/uL   EOS (ABSOLUTE) 0.1 0.0 - 0.4 x10E3/uL   Basophils Absolute 0.1 0.0 - 0.2 x10E3/uL   Immature Granulocytes 0 Not Estab. %   Immature Grans (Abs) 0.0 0.0 - 0.1 x10E3/uL  Comprehensive metabolic panel     Status: Abnormal   Collection Time: 01/22/18 10:03 AM  Result Value Ref Range   Glucose 93 65 - 99 mg/dL   BUN 16 8 - 27 mg/dL   Creatinine, Ser 0.96 0.57 - 1.00 mg/dL   GFR calc non Af Amer 63 >59 mL/min/1.73   GFR calc Af Amer 72 >59 mL/min/1.73   BUN/Creatinine Ratio 17 12 - 28   Sodium 141 134 - 144 mmol/L   Potassium 4.2 3.5 - 5.2 mmol/L   Chloride 99 96 - 106 mmol/L   CO2 24 20 - 29 mmol/L   Calcium 10.4 (H) 8.7 - 10.3 mg/dL   Total Protein 6.8 6.0 - 8.5 g/dL   Albumin 4.4 3.6 - 4.8 g/dL   Globulin, Total 2.4 1.5 - 4.5 g/dL   Albumin/Globulin Ratio 1.8 1.2 - 2.2   Bilirubin Total 0.5 0.0 - 1.2 mg/dL   Alkaline Phosphatase 66 39 - 117 IU/L   AST 32 0 - 40 IU/L   ALT 39 (H) 0 - 32 IU/L  Hemoglobin A1c     Status: Abnormal   Collection Time: 01/22/18 10:03 AM  Result Value Ref Range   Hgb A1c MFr Bld 5.8 (H) 4.8 - 5.6 %    Comment:          Prediabetes: 5.7 - 6.4          Diabetes: >6.4          Glycemic control for adults with diabetes: <7.0     Est. average glucose Bld gHb Est-mCnc 120 mg/dL  Lipid panel     Status: Abnormal   Collection Time: 01/22/18 10:03 AM  Result Value Ref Range   Cholesterol, Total 282 (H) 100 - 199 mg/dL   Triglycerides 183 (H) 0 - 149 mg/dL   HDL 63 >39 mg/dL   VLDL Cholesterol Cal 37 5 - 40 mg/dL   LDL Calculated 182 (H) 0 - 99 mg/dL   Chol/HDL Ratio 4.5 (H) 0.0 - 4.4 ratio  Comment:                                   T. Chol/HDL Ratio                                             Men  Women                               1/2 Avg.Risk  3.4    3.3                                   Avg.Risk  5.0    4.4                                2X Avg.Risk  9.6    7.1                                3X Avg.Risk 23.4   11.0   T4, free     Status: None   Collection Time: 01/22/18 10:03 AM  Result Value Ref Range   Free T4 1.17 0.82 - 1.77 ng/dL  TSH     Status: None   Collection Time: 01/22/18 10:03 AM  Result Value Ref Range   TSH 1.640 0.450 - 4.500 uIU/mL  VITAMIN D 25 Hydroxy (Vit-D Deficiency, Fractures)     Status: None   Collection Time: 01/22/18 10:03 AM  Result Value Ref Range   Vit D, 25-Hydroxy 30.2 30.0 - 100.0 ng/mL    Comment: Vitamin D deficiency has been defined by the [place] and an Endocrine Society practice guideline as a level of serum 25-OH vitamin D less than 20 ng/mL (1,2). The Endocrine Society went on to further define vitamin D insufficiency as a level between 21 and 29 ng/mL (2). 1. IOM (Institute of Medicine). 2010. Dietary reference    intakes for calcium and D. [place]: The    [employer]. 2. Holick MF, Binkley [place], Bischoff-Ferrari HA, et al.    Evaluation, treatment, and prevention of vitamin D    deficiency: an Endocrine Society clinical practice    guideline. JCEM. 2011 Jul; 96(7):1911-30.   IFOBT POC (occult bld, rslt in office)     Status: Abnormal   Collection Time: 02/01/18 11:38 AM  Result Value Ref Range   IFOBT Negative      Comment: sample from 01/26/18   IFOBT Positive     Comment: sample from 01/27/18   IFOBT Negative     Comment: sample from 01/30/18

## 2018-01-30 NOTE — Patient Instructions (Addendum)
-As we discussed please make sure you are drinking one half your weight in ounces water per day and exercising daily.  -Also start off with 1/2 tablet of your medicine nightly before bed as we discussed for about a week to 10 days to make sure you tolerate it well then go to 1 full tablet daily.   The losartan-hydrochlorothiazide you can just take every day.     Risk factors for prediabetes and type 2 diabetes  Researchers don't fully understand why some people develop prediabetes and type 2 diabetes and others don't.  It's clear that certain factors increase the risk, however, including:  Weight. The more fatty tissue you have, the more resistant your cells become to insulin.  Inactivity. The less active you are, the greater your risk. Physical activity helps you control your weight, uses up glucose as energy and makes your cells more sensitive to insulin.  Family history. Your risk increases if a parent or sibling has type 2 diabetes.  Race. Although it's unclear why, people of certain races -- including blacks, Hispanics, American Indians and Asian-Americans -- are at higher risk.  Age. Your risk increases as you get older. This may be because you tend to exercise less, lose muscle mass and gain weight as you age. But type 2 diabetes is also increasing dramatically among children, adolescents and younger adults.  Gestational diabetes. If you developed gestational diabetes when you were pregnant, your risk of developing prediabetes and type 2 diabetes later increases. If you gave birth to a baby weighing more than 9 pounds (4 kilograms), you're also at risk of type 2 diabetes.  Polycystic ovary syndrome. For women, having polycystic ovary syndrome -- a common condition characterized by irregular menstrual periods, excess hair growth and obesity -- increases the risk of diabetes.  High blood pressure. Having blood pressure over 140/90 millimeters of mercury (mm Hg) is linked to an increased risk  of type 2 diabetes.  Abnormal cholesterol and triglyceride levels. If you have low levels of high-density lipoprotein (HDL), or "good," cholesterol, your risk of type 2 diabetes is higher. Triglycerides are another type of fat carried in the blood. People with high levels of triglycerides have an increased risk of type 2 diabetes. Your doctor can let you know what your cholesterol and triglyceride levels are.  A good guide to good carbs: The glycemic index ---If you have diabetes, or at risk for diabetes, you know all too well that when you eat carbohydrates, your blood sugar goes up. The total amount of carbs you consume at a meal or in a snack mostly determines what your blood sugar will do. But the food itself also plays a role. A serving of white rice has almost the same effect as eating pure table sugar -- a quick, high spike in blood sugar. A serving of lentils has a slower, smaller effect.  ---Picking good sources of carbs can help you control your blood sugar and your weight. Even if you don't have diabetes, eating healthier carbohydrate-rich foods can help ward off a host of chronic conditions, from heart disease to various cancers to, well, diabetes.  ---One way to choose foods is with the glycemic index (GI). This tool measures how much a food boosts blood sugar.  The glycemic index rates the effect of a specific amount of a food on blood sugar compared with the same amount of pure glucose. A food with a glycemic index of 28 boosts blood sugar only 28% as much as  pure glucose. One with a GI of 95 acts like pure glucose.    High glycemic foods result in a quick spike in insulin and blood sugar (also known as blood glucose).  Low glycemic foods have a slower, smaller effect- these are healthier for you.   Using the glycemic index Using the glycemic index is easy: choose foods in the low GI category instead of those in the high GI category (see below), and go easy on those in between. Low  glycemic index (GI of 55 or less): Most fruits and vegetables, beans, minimally processed grains, pasta, low-fat dairy foods, and nuts.  Moderate glycemic index (GI 56 to 69): White and sweet potatoes, corn, white rice, couscous, breakfast cereals such as Cream of Wheat and Mini Wheats.  High glycemic index (GI of 70 or higher): White bread, rice cakes, most crackers, bagels, cakes, doughnuts, croissants, most packaged breakfast cereals. You can see the values for 100 commons foods and get links to more at www.[email or web address].si.  Swaps for lowering glycemic index  Instead of this high-glycemic index food Eat this lower-glycemic index food  White rice Brown rice or converted rice  Instant oatmeal Steel-cut oats  Cornflakes Bran flakes  Baked potato Pasta, bulgur  White bread Whole-grain bread  Corn Peas or leafy greens       Prediabetes Eating Plan  Prediabetes--also called impaired glucose tolerance or impaired fasting glucose--is a condition that causes blood sugar (blood glucose) levels to be higher than normal. Following a healthy diet can help to keep prediabetes under control. It can also help to lower the risk of type 2 diabetes and heart disease, which are increased in people who have prediabetes. Along with regular exercise, a healthy diet:  Promotes weight loss.  Helps to control blood sugar levels.  Helps to improve the way that the body uses insulin.   WHAT DO I NEED TO KNOW ABOUT THIS EATING PLAN?   Use the glycemic index (GI) to plan your meals. The index tells you how quickly a food will raise your blood sugar. Choose low-GI foods. These foods take a longer time to raise blood sugar.  Pay close attention to the amount of carbohydrates in the food that you eat. Carbohydrates increase blood sugar levels.  Keep track of how many calories you take in. Eating the right amount of calories will help you to achieve a healthy weight. Losing about 7 percent of your  starting weight can help to prevent type 2 diabetes.  You may want to follow a Mediterranean diet. This diet includes a lot of vegetables, lean meats or fish, whole grains, fruits, and healthy oils and fats.   WHAT FOODS CAN I EAT?  Grains Whole grains, such as whole-wheat or whole-grain breads, crackers, cereals, and pasta. Unsweetened oatmeal. Bulgur. Barley. Quinoa. Brown rice. Corn or whole-wheat flour tortillas or taco shells. Vegetables Lettuce. Spinach. Peas. Beets. Cauliflower. Cabbage. Broccoli. Carrots. Tomatoes. Squash. Eggplant. Herbs. Peppers. Onions. Cucumbers. Brussels sprouts. Fruits Berries. Bananas. Apples. Oranges. Grapes. Papaya. Mango. Pomegranate. Kiwi. Grapefruit. Cherries. Meats and Other Protein Sources Seafood. Lean meats, such as chicken and [place] or lean cuts of pork and beef. Tofu. Eggs. Nuts. Beans. Dairy Low-fat or fat-free dairy products, such as yogurt, cottage cheese, and cheese. Beverages Water. Tea. Coffee. Sugar-free or diet soda. Seltzer water. Milk. Milk alternatives, such as soy or almond milk. Condiments Mustard. Relish. Low-fat, low-sugar ketchup. Low-fat, low-sugar barbecue sauce. Low-fat or fat-free mayonnaise. Sweets and Desserts Sugar-free or low-fat pudding. Sugar-free or  low-fat ice cream and other frozen treats. Fats and Oils Avocado. Walnuts. Olive oil. The items listed above may not be a complete list of recommended foods or beverages. Contact your dietitian for more options.    WHAT FOODS ARE NOT RECOMMENDED?  Grains Refined white flour and flour products, such as bread, pasta, snack foods, and cereals. Beverages Sweetened drinks, such as sweet iced tea and soda. Sweets and Desserts Baked goods, such as cake, cupcakes, pastries, cookies, and cheesecake. The items listed above may not be a complete list of foods and beverages to avoid. Contact your dietitian for more information.   This information is not intended to replace  advice given to you by your health care provider. Make sure you discuss any questions you have with your health care provider.   Document Released: 06/23/2014 Document Reviewed: 06/23/2014 Elsevier Interactive Patient Education 2016 [employer].    Please realize, EXERCISE IS MEDICINE!  -  American Heart Association ( AHA) guidelines for exercise : If you are in good health, without any medical conditions, you should engage in 150-300 minutes of moderate intensity aerobic activity per week.  This means you should be huffing and puffing throughout your workout.   Engaging in regular exercise will improve brain function and memory, as well as improve mood, boost immune system and help with weight management.  As well as the other, more well-known effects of exercise such as decreasing blood sugar levels, decreasing blood pressure,  and decreasing bad cholesterol levels/ increasing good cholesterol levels.     -  The AHA strongly endorses consumption of a diet that contains a variety of foods from all the food categories with an emphasis on fruits and vegetables; fat-free and low-fat dairy products; cereal and grain products; legumes and nuts; and fish, poultry, and/or extra lean meats.    Excessive food intake, especially of foods high in saturated and trans fats, sugar, and salt, should be avoided.    Adequate water intake of roughly 1/2 of your weight in pounds, should equal the ounces of water per day you should drink.  So for instance, if you're 200 pounds, that would be 100 ounces of water per day.         Mediterranean Diet  Why follow it? Research shows  Those who follow the Mediterranean diet have a reduced risk of heart disease   The diet is associated with a reduced incidence of Parkinson's and Alzheimer's diseases  People following the diet may have longer life expectancies and lower rates of chronic diseases   The Dietary Guidelines for Americans recommends the Mediterranean  diet as an eating plan to promote health and prevent disease  What Is the Mediterranean Diet?   Healthy eating plan based on typical foods and recipes of Mediterranean-style cooking  The diet is primarily a plant based diet; these foods should make up a majority of meals   Starches - Plant based foods should make up a majority of meals - They are an important sources of vitamins, minerals, energy, antioxidants, and fiber - Choose whole grains, foods high in fiber and minimally processed items  - Typical grain sources include wheat, oats, barley, corn, brown rice, bulgar, farro, millet, polenta, couscous  - Various types of beans include chickpeas, lentils, fava beans, black beans, white beans   Fruits  Veggies - Large quantities of antioxidant rich fruits & veggies; 6 or more servings  - Vegetables can be eaten raw or lightly drizzled with oil and cooked  -  Vegetables common to the traditional Mediterranean Diet include: artichokes, arugula, beets, broccoli, brussel sprouts, cabbage, carrots, celery, collard greens, cucumbers, eggplant, kale, leeks, lemons, lettuce, mushrooms, okra, onions, peas, peppers, potatoes, pumpkin, radishes, rutabaga, shallots, spinach, sweet potatoes, turnips, zucchini - Fruits common to the Mediterranean Diet include: apples, apricots, avocados, cherries, clementines, dates, figs, grapefruits, grapes, melons, nectarines, oranges, peaches, pears, pomegranates, strawberries, tangerines  Fats - Replace butter and margarine with healthy oils, such as olive oil, canola oil, and tahini  - Limit nuts to no more than a handful a day  - Nuts include walnuts, almonds, pecans, pistachios, pine nuts  - Limit or avoid candied, honey roasted or heavily salted nuts - Olives are central to the [employer] - can be eaten whole or used in a variety of dishes   Meats Protein - Limiting red meat: no more than a few times a month - When eating red meat: choose lean cuts and keep  the portion to the size of deck of cards - Eggs: approx. 0 to 4 times a week  - Fish and lean poultry: at least 2 a week  - Healthy protein sources include, chicken, [place], lean beef, lamb - Increase intake of seafood such as tuna, salmon, trout, mackerel, shrimp, scallops - Avoid or limit high fat processed meats such as sausage and bacon  Dairy - Include moderate amounts of low fat dairy products  - Focus on healthy dairy such as fat free yogurt, skim milk, low or reduced fat cheese - Limit dairy products higher in fat such as whole or 2% milk, cheese, ice cream  Alcohol - Moderate amounts of red wine is ok  - No more than 5 oz daily for women (all ages) and men older than age 75  - No more than 10 oz of wine daily for men younger than 41  Other - Limit sweets and other desserts  - Use herbs and spices instead of salt to flavor foods  - Herbs and spices common to the traditional Mediterranean Diet include: basil, bay leaves, chives, cloves, cumin, fennel, garlic, lavender, marjoram, mint, oregano, parsley, pepper, rosemary, sage, savory, sumac, tarragon, thyme   Its not just a diet, its a lifestyle:   The Mediterranean diet includes lifestyle factors typical of those in the region   Foods, drinks and meals are best eaten with others and savored  Daily physical activity is important for overall good health  This could be strenuous exercise like running and aerobics  This could also be more leisurely activities such as walking, housework, yard-work, or taking the stairs  Moderation is the key; a balanced and healthy diet accommodates most foods and drinks  Consider portion sizes and frequency of consumption of certain foods   Meal Ideas & Options:   Breakfast:  o Whole wheat toast or whole wheat English muffins with peanut butter & hard boiled egg o Steel cut oats topped with apples & cinnamon and skim milk  o Fresh fruit: banana, strawberries, melon, berries, peaches   o Smoothies: strawberries, bananas, greek yogurt, peanut butter o Low fat greek yogurt with blueberries and granola  o Egg white omelet with spinach and mushrooms o Breakfast couscous: whole wheat couscous, apricots, skim milk, cranberries   Sandwiches:  o Hummus and grilled vegetables (peppers, zucchini, squash) on whole wheat bread   o Grilled chicken on whole wheat pita with lettuce, tomatoes, cucumbers or tzatziki  o Tuna salad on whole wheat bread: tuna salad made with greek yogurt,  olives, red peppers, capers, green onions o Garlic rosemary lamb pita: lamb sauted with garlic, rosemary, salt & pepper; add lettuce, cucumber, greek yogurt to pita - flavor with lemon juice and black pepper   Seafood:  o Mediterranean grilled salmon, seasoned with garlic, basil, parsley, lemon juice and black pepper o Shrimp, lemon, and spinach whole-grain pasta salad made with low fat greek yogurt  o Seared scallops with lemon orzo  o Seared tuna steaks seasoned salt, pepper, coriander topped with tomato mixture of olives, tomatoes, olive oil, minced garlic, parsley, green onions and cappers   Meats:  o Herbed greek chicken salad with kalamata olives, cucumber, feta  o Red bell peppers stuffed with spinach, bulgur, lean ground beef (or lentils) & topped with feta   o Kebabs: skewers of chicken, tomatoes, onions, zucchini, squash  o [place] burgers: made with red onions, mint, dill, lemon juice, feta cheese topped with roasted red peppers  Vegetarian o Cucumber salad: cucumbers, artichoke hearts, celery, red onion, feta cheese, tossed in olive oil & lemon juice  o Hummus and whole grain pita points with a greek salad (lettuce, tomato, feta, olives, cucumbers, red onion) o Lentil soup with celery, carrots made with vegetable broth, garlic, salt and pepper  o Tabouli salad: parsley, bulgur, mint, scallions, cucumbers, tomato, radishes, lemon juice, olive oil, salt and pepper.

## 2018-01-31 ENCOUNTER — Telehealth: Payer: Self-pay

## 2018-01-31 DIAGNOSIS — I10 Essential (primary) hypertension: Secondary | ICD-10-CM

## 2018-01-31 MED ORDER — HYDROCHLOROTHIAZIDE 12.5 MG PO CAPS: 12.5000 mg | ORAL_CAPSULE | Freq: Every day | ORAL | 1 refills | Status: DC

## 2018-01-31 MED ORDER — LOSARTAN POTASSIUM 50 MG PO TABS: 50.0000 mg | ORAL_TABLET | Freq: Every day | ORAL | 1 refills | Status: DC

## 2018-01-31 NOTE — Telephone Encounter (Signed)
Pharmacy requesting med change due to back order on losartan-HCTZ combo medication, they do have the seperate medications in stock.  Sent over RXs for Losartan 50 mg and HCTZ 12.5 mg.  MPulliam, CMA/RT(R)

## 2018-02-01 ENCOUNTER — Ambulatory Visit
Admission: RE | Admit: 2018-02-01 | Discharge: 2018-02-01 | Disposition: A | Discharge status: Home / Self Care | Payer: 59 | Source: Ambulatory Visit | Attending: Family Medicine | Admitting: Family Medicine

## 2018-02-01 ENCOUNTER — Other Ambulatory Visit (INDEPENDENT_AMBULATORY_CARE_PROVIDER_SITE_OTHER): Payer: 59

## 2018-02-01 DIAGNOSIS — Z1211 Encounter for screening for malignant neoplasm of colon: Secondary | ICD-10-CM

## 2018-02-01 DIAGNOSIS — Z0000 Encounter for general adult medical examination without abnormal findings: Secondary | ICD-10-CM

## 2018-02-01 DIAGNOSIS — Z1239 Encounter for other screening for malignant neoplasm of breast: Secondary | ICD-10-CM

## 2018-02-01 DIAGNOSIS — N6320 Unspecified lump in the left breast, unspecified quadrant: Secondary | ICD-10-CM

## 2018-02-01 DIAGNOSIS — N6002 Solitary cyst of left breast: Secondary | ICD-10-CM

## 2018-02-01 LAB — IFOBT (OCCULT BLOOD)
IFOBT: NEGATIVE
IFOBT: NEGATIVE
IFOBT: POSITIVE

## 2018-02-15 IMAGING — CT CT ABD-PELV W/ CM
2 of 5 series · 15 of 46 positions shown, 17 images · IV contrast (ISOVUE)
Comparison: None.

CLINICAL DATA: Abdominal pain, nausea, vomiting and diarrhea since
yesterday. Leukocytosis to 16.3.

EXAM:
CT ABDOMEN AND PELVIS WITH CONTRAST
TECHNIQUE: Multidetector CT imaging of the abdomen and pelvis was performed
using the standard protocol following bolus administration of
intravenous contrast.
CONTRAST:  100mL [id number] IOPAMIDOL (J686S4-BFF) INJECTION 61%,
15mL [id number] IOPAMIDOL (J686S4-BFF) INJECTION 61%

[Series 2: abd/pel with · axial · 0.81mm/px · z∈[+944,+1394]mm · 12 of 104 slices shown, 14 images]
[im 7/104  soft-tissue]
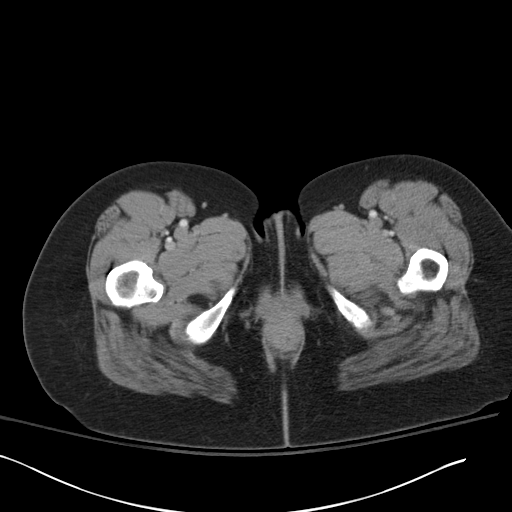
[im 7/104  bone]
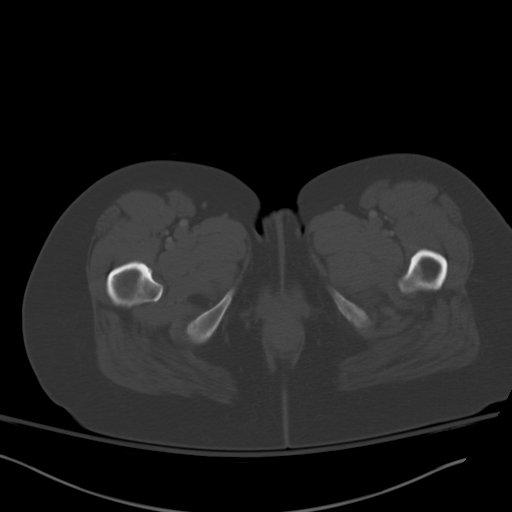
[im 13/104  soft-tissue]
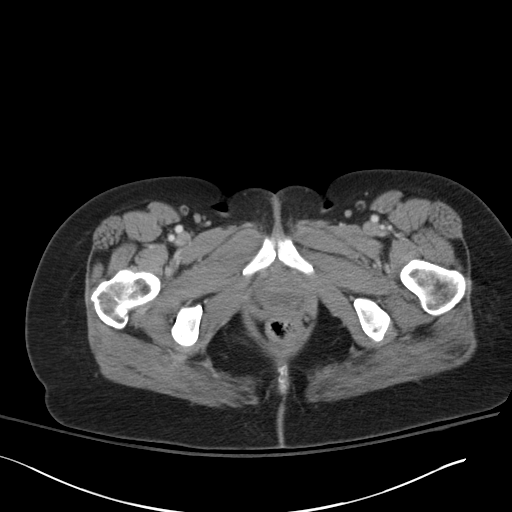
[im 26/104  soft-tissue]
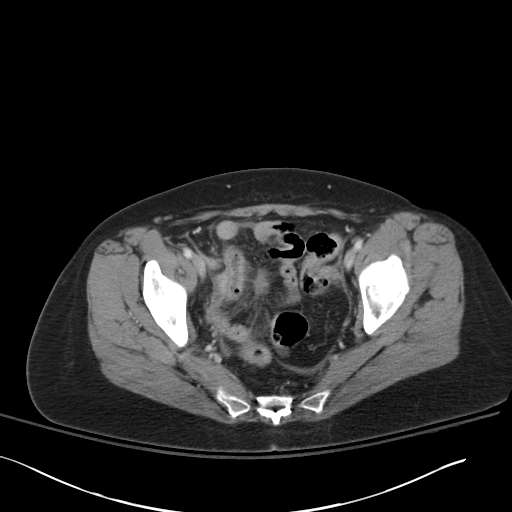
[im 33/104  soft-tissue]
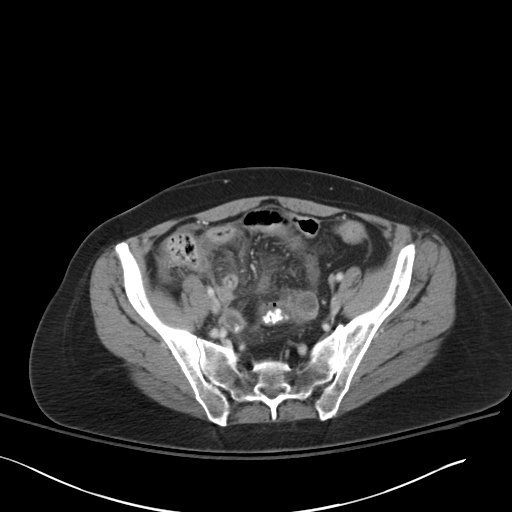
[im 39/104  soft-tissue]
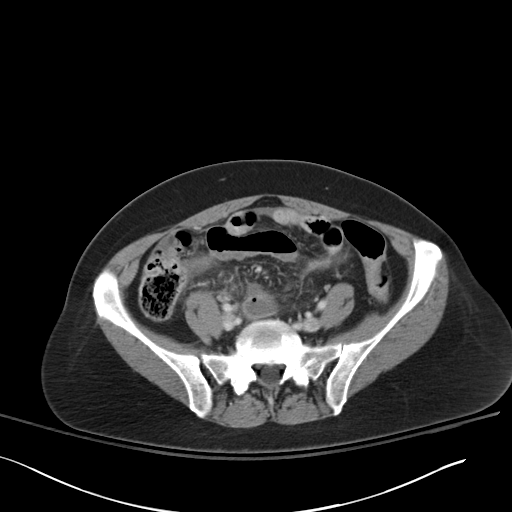
[im 46/104  soft-tissue]
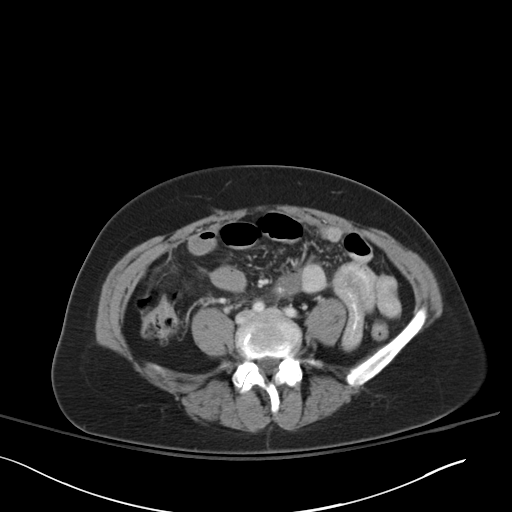
[im 58/104  soft-tissue]
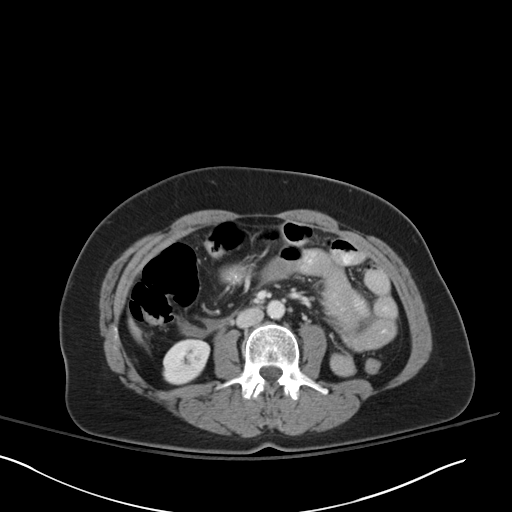
[im 65/104  soft-tissue]
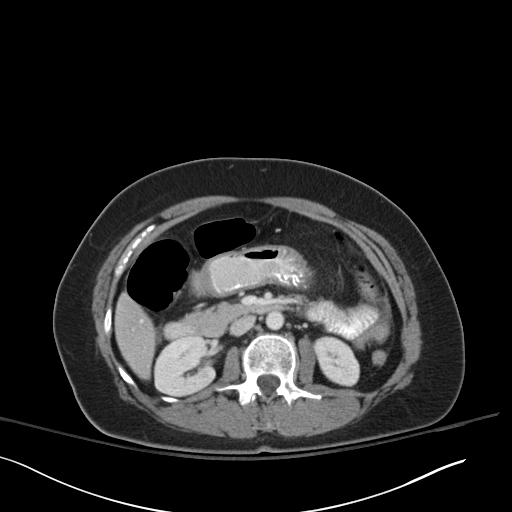
[im 71/104  soft-tissue]
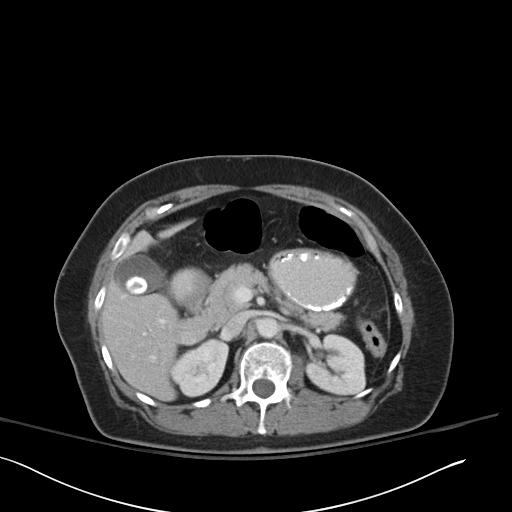
[im 71/104  bone]
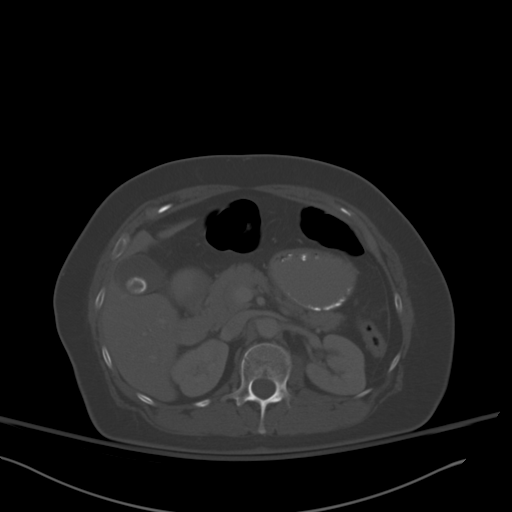
[im 78/104  soft-tissue]
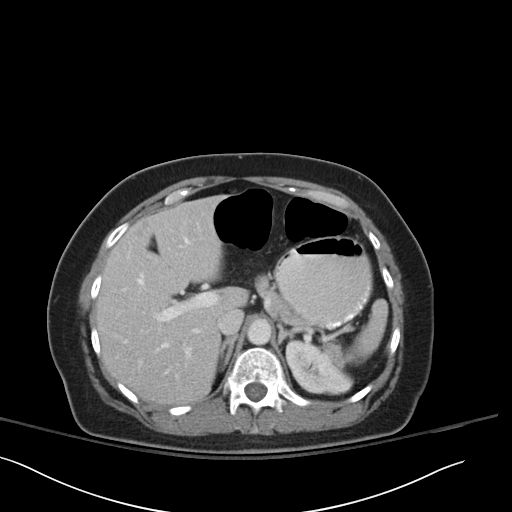
[im 91/104  soft-tissue]
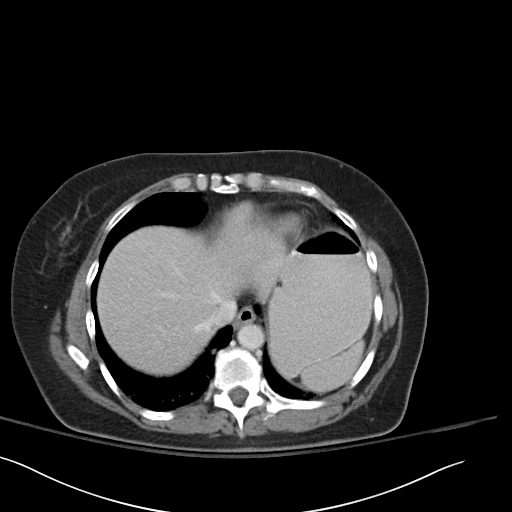
[im 97/104  soft-tissue]
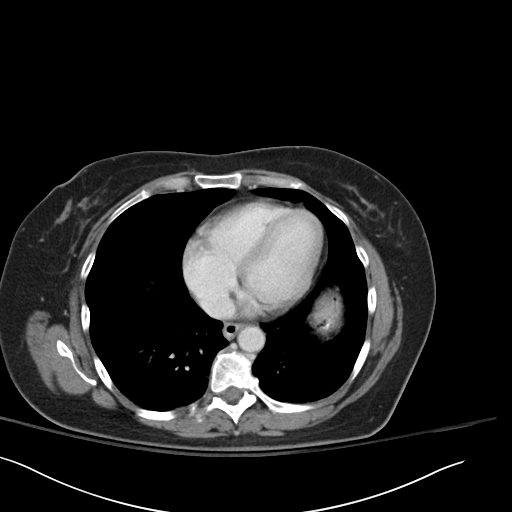

[Series 5: coronal a/|p · coronal · 0.75mm/px · 3 of 121 slices shown]
[im 41/121  soft-tissue]
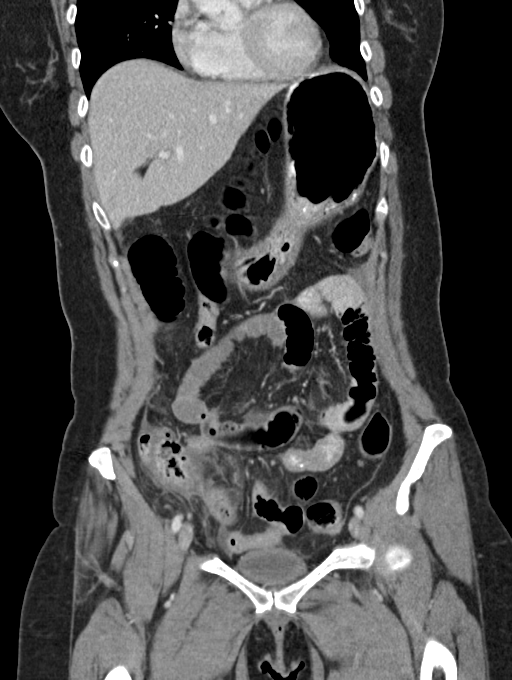
[im 54/121  soft-tissue]
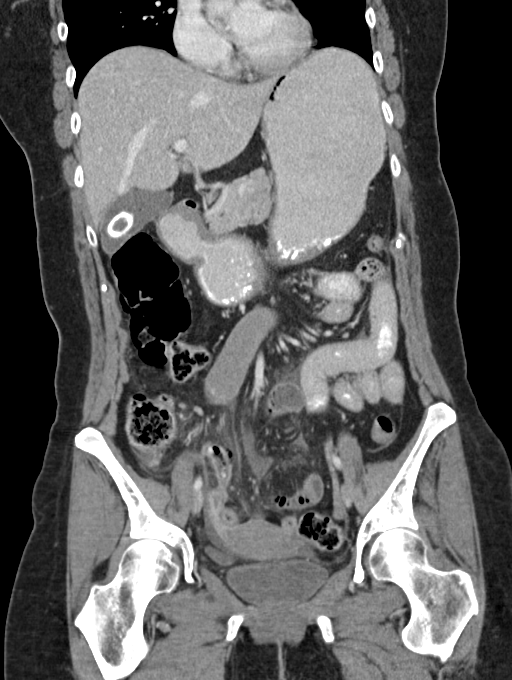
[im 67/121  soft-tissue]
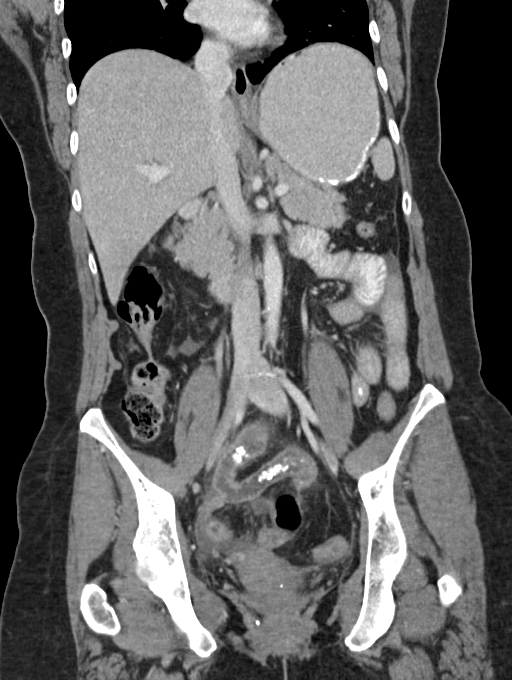

[15 of 46 positions shown; findings below may reference images not displayed]

FINDINGS: Lower chest: Bibasilar dependent atelectasis. No effusion. The
visualized cardiac chambers are within normal limits. No pericardial
effusion.

Hepatobiliary: The liver enhances homogeneously. No intrahepatic
biliary dilatation. There is uncomplicated cholelithiasis with a 17
mm gallstone seen within the gallbladder. No wall thickening nor
pericholecystic fluid.

Pancreas: The pancreas is normal in appearance without
space-occupying mass nor ductal dilatation.

Spleen: The spleen is not enlarged.

Adrenals/Urinary Tract: The adrenal glands are normal. There is a
tiny right interpolar 3 mm hypodensity statistically consistent with
a cyst but too small to further characterize. No obstructive
uropathy. The bladder is physiologically distended.

Stomach/Bowel: There is abnormal distention of the appendix to 12 mm
in diameter. There is inflammatory thickening of the appendix which
contains several appendicoliths, the largest is 6mm in the mid
portion with smaller stones further distal (series 5 images 48-54,
series 4 image 69). Extraluminal gas is seen adjacent to the
appendix consistent with acute perforated appendicitis. There is a
small amount of free fluid in the right hemipelvis without definite
enhancement to suggest an abscess. Sympathetic thickening and
inflammatory change noted of the distal and terminal ileum. This
results in moderate diffuse narrowing of the affected ileal loops
causing more proximal jejunal fluid-filled distention and
thickening. Findings may reflect small-bowel ileus and sympathetic
inflammation.

Vascular/Lymphatic: No significant vascular findings are present. No
enlarged abdominal or pelvic lymph nodes.

Reproductive: Uterus and bilateral adnexa are unremarkable.

Other: Small amount of free fluid in the right lower quadrant and
pelvis as stated.

Musculoskeletal: No acute osseous abnormality.
IMPRESSION: 1. Acute perforated appendicitis with periappendiceal inflammation
and sympathetic thickening of the terminal and distal ileum causing
more proximal small bowel distention and thickening. Small bowel
distention and thickening of jejunal loops may represent a
sympathetic response to the inflammation. Ileus is also present.
Critical Value/emergent results were called by telephone at the time
of interpretation on 01/05/2016 at [DATE] to Dr. OBDULIA ZOLLINGER ,
who verbally acknowledged these results.
2. There are appendicoliths noted within the mid to distal appendix,
the largest is in the mid appendix measuring 6mm.
3. Small amount of free fluid without enhancement noted in the
pelvis and right lower quadrant.
4. Uncomplicated cholelithiasis.
5. 3 mm cyst in the right kidney too small to further characterize.

## 2018-03-26 ENCOUNTER — Other Ambulatory Visit: Payer: Self-pay

## 2018-03-26 ENCOUNTER — Other Ambulatory Visit (INDEPENDENT_AMBULATORY_CARE_PROVIDER_SITE_OTHER): Payer: 59

## 2018-03-26 DIAGNOSIS — E538 Deficiency of other specified B group vitamins: Secondary | ICD-10-CM

## 2018-03-26 DIAGNOSIS — R7989 Other specified abnormal findings of blood chemistry: Secondary | ICD-10-CM

## 2018-03-26 DIAGNOSIS — R945 Abnormal results of liver function studies: Secondary | ICD-10-CM

## 2018-03-27 LAB — COMPREHENSIVE METABOLIC PANEL
ALBUMIN: 4.3 g/dL (ref 3.8–4.8)
ALT: 23 IU/L (ref 0–32)
AST: 22 IU/L (ref 0–40)
Albumin/Globulin Ratio: 1.7 (ref 1.2–2.2)
Alkaline Phosphatase: 61 IU/L (ref 39–117)
BUN / CREAT RATIO: 18 (ref 12–28)
BUN: 16 mg/dL (ref 8–27)
Bilirubin Total: 0.7 mg/dL (ref 0.0–1.2)
CO2: 20 mmol/L (ref 20–29)
Calcium: 9.8 mg/dL (ref 8.7–10.3)
Chloride: 101 mmol/L (ref 96–106)
Creatinine, Ser: 0.88 mg/dL (ref 0.57–1.00)
GFR calc Af Amer: 80 mL/min/{1.73_m2} (ref >59)
GFR calc non Af Amer: 69 mL/min/{1.73_m2} (ref >59)
Globulin, Total: 2.5 g/dL (ref 1.5–4.5)
Glucose: 100 mg/dL — ABNORMAL HIGH (ref 65–99)
Potassium: 4.5 mmol/L (ref 3.5–5.2)
Sodium: 141 mmol/L (ref 134–144)
Total Protein: 6.8 g/dL (ref 6.0–8.5)

## 2018-03-27 LAB — VITAMIN B12: Vitamin B-12: 1023 pg/mL (ref 232–1245)

## 2018-04-02 ENCOUNTER — Encounter: Payer: Self-pay | Admitting: Family Medicine

## 2018-04-02 ENCOUNTER — Ambulatory Visit (INDEPENDENT_AMBULATORY_CARE_PROVIDER_SITE_OTHER): Payer: 59 | Admitting: Family Medicine

## 2018-04-02 VITALS — BP 121/82 | HR 80 | Temp 98.1°F | Ht 65.25 in | Wt 164.3 lb

## 2018-04-02 DIAGNOSIS — E785 Hyperlipidemia, unspecified: Secondary | ICD-10-CM | POA: Diagnosis not present

## 2018-04-02 DIAGNOSIS — K635 Polyp of colon: Secondary | ICD-10-CM | POA: Insufficient documentation

## 2018-04-02 DIAGNOSIS — I10 Essential (primary) hypertension: Secondary | ICD-10-CM

## 2018-04-02 DIAGNOSIS — E781 Pure hyperglyceridemia: Secondary | ICD-10-CM

## 2018-04-02 DIAGNOSIS — R945 Abnormal results of liver function studies: Secondary | ICD-10-CM

## 2018-04-02 DIAGNOSIS — L299 Pruritus, unspecified: Secondary | ICD-10-CM | POA: Insufficient documentation

## 2018-04-02 DIAGNOSIS — R7989 Other specified abnormal findings of blood chemistry: Secondary | ICD-10-CM | POA: Insufficient documentation

## 2018-04-02 DIAGNOSIS — E538 Deficiency of other specified B group vitamins: Secondary | ICD-10-CM

## 2018-04-02 NOTE — Progress Notes (Signed)
Impression and Recommendations:    1. Essential hypertension   2. Hyperlipidemia, unspecified hyperlipidemia type-10-year risk greater than 10%   3. Hypertriglyceridemia   4. B12 deficiency   5. Elevated LFTs- excess ETOH/ wine use   6. Ear itching b/l       1. Chronic Ear Itching, Bilaterally - Advised patient that the most common cause of itchy ears is seborrheic dermatitis.  Discussed creams to use to fix the problem if symptoms; advised patient to mix half Lamisil, half hydrocortisone cream.  - If symptoms do not resolve, continue to follow up with dermatologist.  2. Essential Hypertension - Blood pressure controlled in office today. - Continue medications as prescribed.  See med list below. - Patient tolerating meds well without complication.  Denies S-E  - Lifestyle changes such as dash diet and engaging in a regular exercise program discussed with patient.  Educational handouts provided  - Regular ambulatory BP monitoring encouraged.  Keep log and bring in next OV.  3. Hyperlipidemia - Managed on statin, started in December of 2019. - Continue treatment plan as prescribed..  See med list below. - Patient tolerating meds well without complication.  Denies S-E.  - Strongly advised patient to continue to properly hydrate to avoid side-effects.  - Dietary changes such as low saturated & trans fat and low carb/ ketogenic diets discussed with patient.  Encouraged regular exercise and weight loss when appropriate.   - Re-check fasting lipid profile and ALT in mid-April.  - Elevated LFT's in December 2019, Excess EtOH - Continue to avoid hepatotoxic substances.  - Counseled patient on prudent alcohol consumption habits.  Discussed the importance of keeping serving sizes of wine down to about 4 ounces.  4. Vitamin B12 - WNL last check March 26, 2018 - Continue supplementation as prescribed. - Will continue to monitor.  5. Stress Relief & Counseling - Reviewed the  "spokes of the wheel" of mood and health management.  Stressed the importance of ongoing prudent habits, including regular exercise, appropriate sleep hygiene, healthful dietary habits, and prayer/meditation to relax.  - Extensively discussed daily meditation with patient today.  - Health counseling performed.  All questions answered.  - Advised patient to engage in physical activity, and continue working toward exercising to improve overall mental, physical, and emotional health.  American Heart Association guidelines for healthy diet, basically Mediterranean diet, and exercise guidelines of 30 minutes 5 days per week or more discussed in detail.  - Patient should also consume adequate amounts of water & properly hydrate.    Education and routine counseling performed. Handouts provided.   Medications Discontinued During This Encounter  Medication Reason  . aspirin 81 MG tablet Patient Preference  . Cholecalciferol (VITAMIN D3) 1000 units CAPS Change in therapy  . losartan-hydrochlorothiazide (HYZAAR) 50-12.5 MG tablet Change in therapy  . MAGNESIUM PO Patient Preference     The patient was counseled, risk factors were discussed, anticipatory guidance given.  Gross side effects, risk and benefits, and alternatives of medications discussed with patient.  Patient is aware that all medications have potential side effects and we are unable to predict every side effect or drug-drug interaction that may occur.  Expresses verbal understanding and consents to current therapy plan and treatment regimen.  Return for mid april- FLP, A1c, ALT; OV with me 84mo  Please see AVS handed out to patient at the end of our visit for further patient instructions/ counseling done pertaining to today's office visit.  Note:  This document was prepared using Dragon voice recognition software and may include unintentional dictation errors.  This document serves as a record of services personally performed by  Mellody Dance, DO. It was created on her behalf by Toni Amend, a trained medical scribe. The creation of this record is based on the scribe's personal observations and the provider's statements to them.   I have reviewed the above medical documentation for accuracy and completeness and I concur.  Mellody Dance, DO 04/02/2018 1:10 PM        Subjective:    HPI: Dana Hodge is a 68 y.o. female who presents to [facility] today for follow up of [place].    Dong well, no concerns today.  States "I still have itchy ears, but nobody seems to care."  She spoke to her dermatologist in follow up and was told they were too clean and to stop using Q-tips.  She has been using Curel to some improvement of symptoms.   Alcohol Consumption & Stress Management Notes that she recently engaged in a no alcohol January.  States her wine consumption feels like "a habit to relax," not to get a buzz, but just to engage in downtime after work.  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  Notes that her job is very stressful.  Notes she also engages in "boredom eating" sometimes.   HTN:  -  Her blood pressure may or may not be controlled at home.  Pt has not been checking it regularly.   - Patient reports good compliance with blood pressure medications.  Continues to be asymptomatic.  - Denies medication S-E.   - Smoking Status noted; never smoker, never used tobacco.  - She denies new onset of: chest pain, exercise intolerance, shortness of breath, dizziness, visual changes, headache, lower extremity swelling or claudication.  Last 3 blood pressure readings in our office are as follows: BP Readings from Last 3 Encounters:  04/02/18 121/82  01/30/18 (!) 142/90  01/22/18 (!) 136/94    Pulse Readings from Last 3 Encounters:  04/02/18 80  01/30/18 83  01/22/18 76    Filed Weights   04/02/18 0914  Weight:  164 lb 4.8 oz (74.5 kg)     1. 68 y.o. female here for cholesterol follow-up.  Denies side effects, problems, or concerns.    2. --->We recently started her on a statin which she is tolerating well without side effect.  She is here to discuss the repeat LFTs and blood work that was recently done  - Patient reports good compliance with medications or treatment plan.    - Denies medication S-E   - Smoking Status noted   - She denies new onset of: chest pain, exercise intolerance, shortness of breath, dizziness, visual changes, headache, lower extremity swelling or claudication.   Denies myalgias  The cholesterol last visit was:  Lab Results  Component Value Date   CHOL 282 (H) 01/22/2018   HDL 63 01/22/2018   LDLCALC 182 (H) 01/22/2018   LDLDIRECT 152.0 12/23/2015   TRIG 183 (H) 01/22/2018   CHOLHDL 4.5 (H) 01/22/2018    Hepatic Function Latest Ref Rng & Units 03/26/2018 01/22/2018 01/05/2016  Total Protein 6.0 - 8.5 g/dL 6.8 6.8 7.3  Albumin 3.8 - 4.8 g/dL 4.3 4.4 4.2  AST 0 - 40 IU/L 22 32 29  ALT 0 - 32 IU/L 23 39(H) 24  Alk Phosphatase 39 - 117  IU/L 61 66 47  Total Bilirubin 0.0 - 1.2 mg/dL 0.7 0.5 1.6(H)  Bilirubin, Direct 0.0 - 0.3 mg/dL - - -     Patient Care Team    Relationship Specialty Notifications Start End  Mellody Dance, DO PCP - General Family Medicine  10/17/17      Lab Results  Component Value Date   CREATININE 0.88 03/26/2018   BUN 16 03/26/2018   NA 141 03/26/2018   K 4.5 03/26/2018   CL 101 03/26/2018   CO2 20 03/26/2018    Lab Results  Component Value Date   CHOL 282 (H) 01/22/2018   CHOL 298 (H) 04/28/2016   CHOL 229 (H) 12/23/2015    Lab Results  Component Value Date   HDL 63 01/22/2018   HDL 64 04/28/2016   HDL 48.00 12/23/2015    Lab Results  Component Value Date   LDLCALC 182 (H) 01/22/2018   LDLCALC 81 03/26/2015   LDLCALC 206 (H) 06/05/2014    Lab Results  Component Value Date   TRIG 183 (H) 01/22/2018   TRIG  205.0 (H) 12/23/2015   TRIG 95.0 03/26/2015    Lab Results  Component Value Date   CHOLHDL 4.5 (H) 01/22/2018   CHOLHDL 5 12/23/2015   CHOLHDL 3 03/26/2015    Lab Results  Component Value Date   LDLDIRECT 152.0 12/23/2015   LDLDIRECT 169.0 05/29/2012   LDLDIRECT 147.9 12/31/2009   ===================================================================   Patient Active Problem List   Diagnosis Date Noted  . Prediabetes 01/30/2018    Priority: High  . Hyperlipidemia 01/12/2009    Priority: High  . Essential hypertension 01/12/2009    Priority: High  . Elevated LFTs 04/02/2018    Priority: Medium  . Hypertriglyceridemia 01/30/2018    Priority: Medium  . Esophageal reflux 03/26/2015    Priority: Medium  . Ear itching b/l  04/02/2018    Priority: Low  . Noncompliance 10/17/2017    Priority: Low  . B12 deficiency 10/17/2017    Priority: Low  . Vitamin D deficiency 10/17/2017    Priority: Low  . Hypomagnesemia 10/17/2017    Priority: Low  . Hypokalemia 01/14/2016    Priority: Low  . Colon polyp 04/02/2018  . Glucose intolerance (impaired glucose tolerance) 01/30/2018  . Abdominal abscesses x3 s/p lap LOA & drainage 01/15/2016 01/15/2016  . Acute appendicitis with perforation and peritoneal abscess s/p lap appendectomy 01/15/2016 01/05/2016  . Borderline glaucoma of both eyes with anatomical narrow angle 12/03/2013     Past Medical History:  Diagnosis Date  . Chicken pox   . Colon polyp   . Hyperlipidemia   . Hypertension   . UTI (urinary tract infection)      Past Surgical History:  Procedure Laterality Date  . LAPAROSCOPIC APPENDECTOMY N/A 01/15/2016   Procedure: APPENDECTOMY LAPAROSCOPIC. DRAINAGE OF INTRA ABDOMINAL ABSCESSES X 3, LYSIS OF ADHESIONS;  Surgeon: Michael Boston, MD;  Location: [facility];  Service: General;  Laterality: N/A;     Family History  Problem Relation Age of Onset  . Heart disease Mother 98       CAD  . Hypertension Mother   .  Hyperlipidemia Mother   . Heart disease Father 16       MI  . COPD Father   . Cancer Maternal Uncle        colon, pancreatic  . Cancer Maternal Grandfather        colon, pancreatic  . Hypertension Sister   . Alcoholism Paternal Uncle   .  Stroke Maternal Grandmother      Social History   Substance and Sexual Activity  Drug Use No  ,  Social History   Substance and Sexual Activity  Alcohol Use Yes   Comment: a glass of wine with dinner   ,  Social History   Tobacco Use  Smoking Status Never Smoker  Smokeless Tobacco Never Used  ,    Current Outpatient Medications on File Prior to Visit  Medication Sig Dispense Refill  . atorvastatin (LIPITOR) 20 MG tablet Take 1 tablet (20 mg total) by mouth at bedtime. 90 tablet 3  . hydrochlorothiazide (MICROZIDE) 12.5 MG capsule Take 1 capsule (12.5 mg total) by mouth daily. 90 capsule 1  . losartan (COZAAR) 50 MG tablet Take 1 tablet (50 mg total) by mouth daily. 90 tablet 1  . vitamin B-12 (CYANOCOBALAMIN) 500 MCG tablet Take 500 mcg by mouth daily.     . Vitamin D, Ergocalciferol, (DRISDOL) 1.25 MG (50000 UT) CAPS capsule Take 1 capsule (50,000 Units total) by mouth every 7 (seven) days. 12 capsule 3   No current facility-administered medications on file prior to visit.      No Known Allergies   Review of Systems:   General:  Denies fever, chills Optho/Auditory:   Denies visual changes, blurred vision Respiratory:   Denies SOB, cough, wheeze, DIB  Cardiovascular:   Denies chest pain, palpitations, painful respirations Gastrointestinal:   Denies nausea, vomiting, diarrhea.  Endocrine:     Denies new hot or cold intolerance Musculoskeletal:  Denies joint swelling, gait issues, or new unexplained myalgias/ arthralgias Skin:  Denies rash, suspicious lesions  Neurological:    Denies dizziness, unexplained weakness, numbness  Psychiatric/Behavioral:   Denies mood changes  Objective:    Blood pressure 121/82, pulse 80,  temperature 98.1 F (36.7 C), height 5' 5.25" (1.657 m), weight 164 lb 4.8 oz (74.5 kg), SpO2 98 %.  Body mass index is 27.13 kg/m.  General: Well Developed, well nourished, and in no acute distress.  HEENT: Normocephalic, atraumatic, pupils equal round reactive to light, neck supple, No carotid bruits, no JVD Skin: Warm and dry, cap RF less 2 sec Cardiac: Regular rate and rhythm, S1, S2 WNL's, no murmurs rubs or gallops Respiratory: ECTA B/L, Not using accessory muscles, speaking in full sentences. NeuroM-Sk: Ambulates w/o assistance, moves ext * 4 w/o difficulty, sensation grossly intact.  Ext: scant edema b/l lower ext Psych: No HI/SI, judgement and insight good, Euthymic mood. Full Affect.

## 2018-04-02 NOTE — Patient Instructions (Addendum)
Please utilize a mixture of half Lamisil cream, half hydrocortisone cream to help alleviate your itchy ears.  Do not insert anything smaller than a pinky finger into your ear.     Seborrheic Dermatitis, Adult Seborrheic dermatitis is a skin disease that causes red, scaly patches. It usually occurs on the scalp, and it is often called dandruff. The patches may appear on other parts of the body. Skin patches tend to appear where there are many oil glands in the skin. Areas of the body that are commonly affected include:  Scalp.  Skin folds of the body.  Ears.  Eyebrows.  Neck.  Face.  Armpits.  The bearded area of men's faces. The condition may come and go for no known reason, and it is often long-lasting (chronic). What are the causes? The cause of this condition is not known. What increases the risk? This condition is more likely to develop in people who:  Have certain conditions, such as: ? HIV (human immunodeficiency virus). ? AIDS (acquired immunodeficiency syndrome). ? Parkinson disease. ? Mood disorders, such as depression.  Are 47-65 years old. What are the signs or symptoms? Symptoms of this condition include:  Thick scales on the scalp.  Redness on the face or in the armpits.  Skin that is flaky. The flakes may be white or yellow.  Skin that seems oily or dry but is not helped with moisturizers.  Itching or burning in the affected areas. How is this diagnosed? This condition is diagnosed with a medical history and physical exam. A sample of your skin may be tested (skin biopsy). You may need to see a skin specialist (dermatologist). How is this treated? There is no cure for this condition, but treatment can help to manage the symptoms. You may get treatment to remove scales, lower the risk of skin infection, and reduce swelling or itching. Treatment may include:  Creams that reduce swelling and irritation (steroids).  Creams that reduce skin  yeast.  Medicated shampoo, soaps, moisturizing creams, or ointments.  Medicated moisturizing creams or ointments. Follow these instructions at home:  Apply over-the-counter and prescription medicines only as told by your health care provider.  Use any medicated shampoo, soaps, skin creams, or ointments only as told by your health care provider.  Keep all follow-up visits as told by your health care provider. This is important. Contact a health care provider if:  Your symptoms do not improve with treatment.  Your symptoms get worse.  You have new symptoms. This information is not intended to replace advice given to you by your health care provider. Make sure you discuss any questions you have with your health care provider. Document Released: 02/06/2005 Document Revised: 08/27/2015 Document Reviewed: 05/27/2015 Elsevier Interactive Patient Education  2019 [employer].

## 2018-06-10 ENCOUNTER — Other Ambulatory Visit: Payer: 59

## 2018-07-01 ENCOUNTER — Other Ambulatory Visit: Payer: Self-pay

## 2018-07-01 ENCOUNTER — Encounter: Payer: Self-pay | Admitting: Family Medicine

## 2018-07-01 ENCOUNTER — Ambulatory Visit (INDEPENDENT_AMBULATORY_CARE_PROVIDER_SITE_OTHER): Payer: 59 | Admitting: Family Medicine

## 2018-07-01 VITALS — BP 135/85 | HR 74 | Temp 96.8°F | Ht 65.25 in | Wt 169.8 lb

## 2018-07-01 DIAGNOSIS — R7989 Other specified abnormal findings of blood chemistry: Secondary | ICD-10-CM

## 2018-07-01 DIAGNOSIS — E782 Mixed hyperlipidemia: Secondary | ICD-10-CM | POA: Diagnosis not present

## 2018-07-01 DIAGNOSIS — R7302 Impaired glucose tolerance (oral): Secondary | ICD-10-CM

## 2018-07-01 DIAGNOSIS — I10 Essential (primary) hypertension: Secondary | ICD-10-CM | POA: Diagnosis not present

## 2018-07-01 DIAGNOSIS — R945 Abnormal results of liver function studies: Secondary | ICD-10-CM

## 2018-07-01 DIAGNOSIS — E663 Overweight: Secondary | ICD-10-CM

## 2018-07-01 DIAGNOSIS — E781 Pure hyperglyceridemia: Secondary | ICD-10-CM

## 2018-07-01 NOTE — Progress Notes (Signed)
Telehealth office visit note for Dana Hodge, D.O- at [facility] at [facility]   I connected with current patient today and verified that I am speaking with the correct person using two identifiers.   . Location of the patient: Home . Location of the provider: Office Only the patient (+/- their family members at pt's discretion) and myself were participating in the encounter    - This visit type was conducted due to national recommendations for restrictions regarding the COVID-19 Pandemic (e.g. social distancing) in an effort to limit this patient's exposure and mitigate transmission in our community.  This format is felt to be most appropriate for this patient at this time.   - The patient did not have access to video technology or had technical difficulties with video requiring transitioning to audio format only. - No physical exam could be performed with this format, beyond that communicated to [place] by the patient/ family members as noted.   - Additionally my office staff/ schedulers discussed with the patient that there may be a monetary charge related to this service, depending on their medical insurance.   The patient expressed understanding, and agreed to proceed.       History of Present Illness:  1.  Wt gain:   - has gained 5 lbs since last OV.  - plans on walking neighborhood daily- 23 minutes it takes her - she plans on doing  it twice day  - no plans   2.  HTN HPI:  -  Her blood pressure has been controlled at home.  Pt is checking it at home. 135/85 today and lower- 120's-130's/70-80's.  - Patient reports good compliance with blood pressure medications  - Denies medication S-E   - Smoking Status noted   - She denies new onset of: chest pain, exercise intolerance, shortness of breath, dizziness, visual changes, headache, lower extremity swelling or claudication.   Today their BP is BP: 135/85   Last 3 blood pressure readings in our office are as  follows: BP Readings from Last 3 Encounters:  07/01/18 135/85  04/02/18 121/82  01/30/18 (!) 142/90    Filed Weights   07/01/18 0805  Weight: 169 lb 12.8 oz (77 kg)      3. CHOL HPI:   -  She  is currently managed with:  See med list from today  Txmnt compliance-   Patient reports very little compliance with low chol/ saturated and trans fat diet.  No exercise  RUQ pain- no    Muscle aches- no  No other s-e  Last lipid panel as follows:  Lab Results  Component Value Date   CHOL 282 (H) 01/22/2018   HDL 63 01/22/2018   LDLCALC 182 (H) 01/22/2018   LDLDIRECT 152.0 12/23/2015   TRIG 183 (H) 01/22/2018   CHOLHDL 4.5 (H) 01/22/2018    Hepatic Function Latest Ref Rng & Units 03/26/2018 01/22/2018 01/05/2016  Total Protein 6.0 - 8.5 g/dL 6.8 6.8 7.3  Albumin 3.8 - 4.8 g/dL 4.3 4.4 4.2  AST 0 - 40 IU/L 22 32 29  ALT 0 - 32 IU/L 23 39(H) 24  Alk Phosphatase 39 - 117 IU/L 61 66 47  Total Bilirubin 0.0 - 1.2 mg/dL 0.7 0.5 1.6(H)  Bilirubin, Direct 0.0 - 0.3 mg/dL - - -        Impression and Recommendations:    1. Essential hypertension   2. Glucose intolerance (impaired glucose tolerance)   3. Elevated LFTs  4. Mixed hyperlipidemia   5. Hypertriglyceridemia   6. Overweight (BMI 25.0-29.9)     BP: Well controlled on current regiment, low-salt diet discussed with patient.  Exercise regular.  Goal for patient she would like to get off her medicines as much as possible.  Prediabetes: A1c was last 5.8 when checked in December 2019.  We will recheck in 1 month.  She understands low carbohydrate diet and will track everything she puts in her mouth using a free online app  Hyperlipidemia and hypertriglyceridemia:  -Patient remains on Lipitor, doing well.  Tolerating well.  LFTs on repeat actually came down on the medicine.  We will recheck fasting lipid profile 5 months after she started the medicines which would be in about a month from now. -Patient understands low  saturated trans-fat diet  Overweight: Walk 23 min ever day or every other - track everything Lose it or MFP or WW - 2-4 lb wt loss in 4 wks - 1/2 wt in ounces of water daily- 85 oz.    - As part of my medical decision making, I reviewed the following data within the [place] History obtained from pt /family, CMA notes reviewed and incorporated if applicable, Labs reviewed, Radiograph/ tests reviewed if applicable and OV notes from prior OV's with me, as well as other specialists she/he has seen since seeing me last, were all reviewed and used in my medical decision making process today.   - Additionally, discussion had with patient regarding txmnt plan, and their biases/concerns about that plan were used in my medical decision making today.   - The patient agreed with the plan and demonstrated an understanding of the instructions.   No barriers to understanding were identified.   - Red flag symptoms and signs discussed in detail.  Patient expressed understanding regarding what to do in case of emergency\ urgent symptoms.  The patient was advised to call back or seek an in-person evaluation if the symptoms worsen or if the condition fails to improve as anticipated.   Return for FLP, A1c in 3wks, then ov with me- wt loss goals 4 wks.    Orders Placed This Encounter  Procedures  . Hemoglobin A1c  . Lipid panel    I provided 31`minutes of non-face-to-face time during this encounter,with over 50% of the time in direct counseling on patients medical conditions/ medical concerns.  Additional time was spent with charting and coordination of care after the actual visit commenced.   Note:  This note was prepared with assistance of Dragon voice recognition software. Occasional wrong-word or sound-a-like substitutions may have occurred due to the inherent limitations of voice recognition software.  Dana Dance, DO       -Vitals obtained; medications/ allergies  reconciled;  personal medical, social, Sx etc.histories were updated by CMA, reviewed by me and are reflected in chart   Patient Care Team    Relationship Specialty Notifications Start End  Dana Dance, DO PCP - General Family Medicine  10/17/17      Patient Active Problem List   Diagnosis Date Noted  . Prediabetes 01/30/2018    Priority: High  . Mixed hyperlipidemia 01/12/2009    Priority: High  . Essential hypertension 01/12/2009    Priority: High  . Elevated LFTs 04/02/2018    Priority: Medium  . Hypertriglyceridemia 01/30/2018    Priority: Medium  . Esophageal reflux 03/26/2015    Priority: Medium  . Ear itching b/l  04/02/2018    Priority: Low  .  Noncompliance 10/17/2017    Priority: Low  . B12 deficiency 10/17/2017    Priority: Low  . Vitamin D deficiency 10/17/2017    Priority: Low  . Hypomagnesemia 10/17/2017    Priority: Low  . Hypokalemia 01/14/2016    Priority: Low  . Overweight (BMI 25.0-29.9) 07/01/2018  . Colon polyp 04/02/2018  . Glucose intolerance (impaired glucose tolerance) 01/30/2018  . Abdominal abscesses x3 s/p lap LOA & drainage 01/15/2016 01/15/2016  . Acute appendicitis with perforation and peritoneal abscess s/p lap appendectomy 01/15/2016 01/05/2016  . 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 atorvastatin (LIPITOR) 20 MG tablet Take 1 tablet (20 mg total) by mouth at bedtime.  Marland Kitchen b complex vitamins tablet Take 1 tablet by mouth daily.  . hydrochlorothiazide (MICROZIDE) 12.5 MG capsule Take 1 capsule (12.5 mg total) by mouth daily.  Marland Kitchen losartan (COZAAR) 50 MG tablet Take 1 tablet (50 mg total) by mouth daily.  . vitamin B-12 (CYANOCOBALAMIN) 500 MCG tablet Take 500 mcg by mouth daily.   . Vitamin D, Ergocalciferol, (DRISDOL) 1.25 MG (50000 UT) CAPS capsule Take 1 capsule (50,000 Units total) by mouth every 7 (seven) days.     Allergies:  No Known Allergies   ROS:   See above HPI for pertinent positives and negatives   Objective:   Blood pressure 135/85, pulse 74, temperature (!) 96.8 F (36 C), height 5' 5.25" (1.657 m), weight 169 lb 12.8 oz (77 kg).  (if some vitals are omitted, this means that patient was UNABLE to obtain them even though they were asked to get them prior to OV today.  They were asked to call us at their earliest convenience with these once obtained. )  General: A & O * 3; sounds in no acute distress; in usual state of health.  Skin: Pt confirms warm and dry extremities and pink fingertips HEENT: Pt confirms lips non-cyanotic Chest: Patient confirms normal chest excursion and movement Respiratory: speaking in full sentences, no conversational dyspnea; patient confirms no use of accessory muscles Psych: insight appears good, mood- appears full

## 2018-07-22 ENCOUNTER — Other Ambulatory Visit: Payer: Self-pay

## 2018-07-22 ENCOUNTER — Other Ambulatory Visit (INDEPENDENT_AMBULATORY_CARE_PROVIDER_SITE_OTHER): Payer: 59

## 2018-07-22 DIAGNOSIS — R945 Abnormal results of liver function studies: Secondary | ICD-10-CM

## 2018-07-22 DIAGNOSIS — I10 Essential (primary) hypertension: Secondary | ICD-10-CM

## 2018-07-22 DIAGNOSIS — E781 Pure hyperglyceridemia: Secondary | ICD-10-CM

## 2018-07-22 DIAGNOSIS — R7989 Other specified abnormal findings of blood chemistry: Secondary | ICD-10-CM

## 2018-07-22 DIAGNOSIS — R7302 Impaired glucose tolerance (oral): Secondary | ICD-10-CM

## 2018-07-23 LAB — LIPID PANEL
Chol/HDL Ratio: 3 ratio (ref 0.0–4.4)
Cholesterol, Total: 169 mg/dL (ref 100–199)
HDL: 57 mg/dL (ref >39)
LDL Calculated: 88 mg/dL (ref 0–99)
Triglycerides: 122 mg/dL (ref 0–149)
VLDL Cholesterol Cal: 24 mg/dL (ref 5–40)

## 2018-07-23 LAB — HEMOGLOBIN A1C
Est. average glucose Bld gHb Est-mCnc: 111 mg/dL
Hgb A1c MFr Bld: 5.5 % (ref 4.8–5.6)

## 2018-07-31 ENCOUNTER — Ambulatory Visit (INDEPENDENT_AMBULATORY_CARE_PROVIDER_SITE_OTHER): Payer: 59 | Admitting: Family Medicine

## 2018-07-31 ENCOUNTER — Other Ambulatory Visit: Payer: Self-pay

## 2018-07-31 ENCOUNTER — Encounter: Payer: Self-pay | Admitting: Family Medicine

## 2018-07-31 VITALS — Wt 168.0 lb

## 2018-07-31 DIAGNOSIS — R7303 Prediabetes: Secondary | ICD-10-CM | POA: Diagnosis not present

## 2018-07-31 DIAGNOSIS — E663 Overweight: Secondary | ICD-10-CM

## 2018-07-31 DIAGNOSIS — I10 Essential (primary) hypertension: Secondary | ICD-10-CM

## 2018-07-31 DIAGNOSIS — E781 Pure hyperglyceridemia: Secondary | ICD-10-CM

## 2018-07-31 DIAGNOSIS — E782 Mixed hyperlipidemia: Secondary | ICD-10-CM | POA: Diagnosis not present

## 2018-07-31 DIAGNOSIS — Z91199 Patient's noncompliance with other medical treatment and regimen due to unspecified reason: Secondary | ICD-10-CM

## 2018-07-31 DIAGNOSIS — Z9119 Patient's noncompliance with other medical treatment and regimen: Secondary | ICD-10-CM

## 2018-07-31 NOTE — Progress Notes (Signed)
Telehealth office visit note for Dana Hodge, D.O- at Primary Care at [facility]   I connected with current patient today and verified that I am speaking with the correct person using two identifiers.   . Location of the patient: Home . Location of the provider: Office Only the patient (+/- their family members at pt's discretion) and myself were participating in the encounter    - This visit type was conducted due to national recommendations for restrictions regarding the COVID-19 Pandemic (e.g. social distancing) in an effort to limit this patient's exposure and mitigate transmission in our community.  This format is felt to be most appropriate for this patient at this time.   - The patient did not have access to video technology or had technical difficulties with video requiring transitioning to audio format only. - No physical exam could be performed with this format, beyond that communicated to [place] by the patient/ family members as noted.   - Additionally my office staff/ schedulers discussed with the patient that there may be a monetary charge related to this service, depending on their medical insurance.   The patient expressed understanding, and agreed to proceed.       History of Present Illness:  Here to review labs and chronic conditions as well review pt's health goals from last OV:    Collection Time: 07/22/18  9:27 AM  Result Value Ref Range   Cholesterol, Total 169 100 - 199 mg/dL   Triglycerides 122-   prior 183 0 - 149 mg/dL   HDL 57-   prior 63 >39 mg/dL   VLDL Cholesterol Cal 24 5 - 40 mg/dL   LDL Calculated 88-     prior 182 0 - 99 mg/dL   Chol/HDL Ratio 3.0 0.0 - 4.4 ratio  Hemoglobin A1c     Status: None   Collection Time: 07/22/18  9:27 AM  Result Value Ref Range   Hgb A1c MFr Bld 5.5-   prior 5.8 4.8 - 5.6 %    Overweight: --> GOALS made last time. - lost 2 lbs total but prior to the past 2 wks,  when she started falling off wagon, it was  more- around 5lbs wt loss  1)  Walk 23 min ever day or every other-        Did it faithfully for couple weeks every day 2)  track everything Lose it or [employer] or [employer]-       Did not track at all 3)  2-4 lb wt loss in 4 wks-                Hit goal with 2 lb wt loss- congratulated pt 4)  1/2 wt in ounces of water daily- 85 oz. -        Has done it!   HTN:   Bp at home:  -->  Was faithful about checking it for past several wks- running about 130/ 80-85. No sx, feeling well.  Taking her hydrochlorothiazide and losartan faithfully without side effect   HLD:   Has been taking the STATIN nightly ever day.  Came down very nicely     Pre-Dm:    a1c 5.8  6 mo ago.   5.5 now!!  Drinking more water    Impression and Recommendations:    1. Essential hypertension   2. Mixed hyperlipidemia   3. Hypertriglyceridemia   4. Prediabetes   5. Overweight (BMI 25.0-29.9)   6.  Noncompliance-   not an issue now     -Blood pressure at goal, continue meds.  Continue healthy lifestyle  Cholesterol levels are greatly improved from last time.  Continue current medications, reminded patient of first-line treatment of diet and exercise in addition to her medications.  Prediabetes: Now nonexistent.  Reminded patient that she will be at increased risk and just needs to continue her healthier habits to prevent onset of diabetes.  We will continue to monitor every year or more frequent if needed  -Congratulated patient on her weight loss and commitment to healthier habits as well as taking her meds regularly.  Reminded patient that it is about preventing diseases not waiting until something bad occurs  - As part of my medical decision making, I reviewed the following data within the [place] History obtained from pt /family, CMA notes reviewed and incorporated if applicable, Labs reviewed, Radiograph/ tests reviewed if applicable and OV notes from prior OV's with me, as well as other specialists  she/he has seen since seeing me last, were all reviewed and used in my medical decision making process today.   - Additionally, discussion had with patient regarding txmnt plan, and their biases/concerns about that plan were used in my medical decision making today.   - The patient agreed with the plan and demonstrated an understanding of the instructions.   No barriers to understanding were identified.   - Red flag symptoms and signs discussed in detail.  Patient expressed understanding regarding what to do in case of emergency\ urgent symptoms.  The patient was advised to call back or seek an in-person evaluation if the symptoms worsen or if the condition fails to improve as anticipated.   Return for next couple months- come in for CPE + pap.   I provided 26+ minutes of non-face-to-face time during this encounter,with over 50% of the time in direct counseling on patients medical conditions/ medical concerns.  Additional time was spent with charting and coordination of care after the actual visit commenced.   Note:  This note was prepared with assistance of Dragon voice recognition software. Occasional wrong-word or sound-a-like substitutions may have occurred due to the inherent limitations of voice recognition software.  Dana Dance, DO 07/31/2018 7:11 PM    Patient Care Team    Relationship Specialty Notifications Start End  Dana Dance, DO PCP - General Family Medicine  10/17/17      -Vitals obtained; medications/ allergies reconciled;  personal medical, social, Sx etc.histories were updated by CMA, reviewed by me and are reflected in chart   Patient Active Problem List   Diagnosis Date Noted  . Prediabetes 01/30/2018    Priority: High  . Mixed hyperlipidemia 01/12/2009    Priority: High  . Essential hypertension 01/12/2009    Priority: High  . Elevated LFTs 04/02/2018    Priority: Medium  . Hypertriglyceridemia 01/30/2018    Priority: Medium  . Esophageal reflux  03/26/2015    Priority: Medium  . Ear itching b/l  04/02/2018    Priority: Low  . Noncompliance 10/17/2017    Priority: Low  . B12 deficiency 10/17/2017    Priority: Low  . Vitamin D deficiency 10/17/2017    Priority: Low  . Hypomagnesemia 10/17/2017    Priority: Low  . Hypokalemia 01/14/2016    Priority: Low  . Overweight (BMI 25.0-29.9) 07/01/2018  . Colon polyp 04/02/2018  . Glucose intolerance (impaired glucose tolerance) 01/30/2018  . Abdominal abscesses x3 s/p lap LOA & drainage  01/15/2016 01/15/2016  . Acute appendicitis with perforation and peritoneal abscess s/p lap appendectomy 01/15/2016 01/05/2016  . 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 atorvastatin (LIPITOR) 20 MG tablet Take 1 tablet (20 mg total) by mouth at bedtime.  Marland Kitchen b complex vitamins tablet Take 1 tablet by mouth daily.  . vitamin B-12 (CYANOCOBALAMIN) 500 MCG tablet Take 500 mcg by mouth daily.   . Vitamin D, Ergocalciferol, (DRISDOL) 1.25 MG (50000 UT) CAPS capsule Take 1 capsule (50,000 Units total) by mouth every 7 (seven) days.  . [DISCONTINUED] hydrochlorothiazide (MICROZIDE) 12.5 MG capsule Take 1 capsule (12.5 mg total) by mouth daily.  . [DISCONTINUED] losartan (COZAAR) 50 MG tablet Take 1 tablet (50 mg total) by mouth daily.     Allergies:  No Known Allergies   ROS:  See above HPI for pertinent positives and negatives   Objective:   Weight 168 lb (76.2 kg).  (if some vitals are omitted, this means that patient was UNABLE to obtain them even though they were asked to get them prior to OV today.  They were asked to call us at their earliest convenience with these once obtained. )  General: A & O * 3; sounds in no acute distress; in usual state of health.  Skin: Pt confirms warm and dry extremities and pink fingertips HEENT: Pt confirms lips non-cyanotic Chest: Patient confirms normal chest excursion and movement  Respiratory: speaking in full sentences, no conversational dyspnea; patient confirms no use of accessory muscles Psych: insight appears good, mood- appears full

## 2018-08-01 ENCOUNTER — Other Ambulatory Visit: Payer: Self-pay | Admitting: Family Medicine

## 2018-08-01 DIAGNOSIS — I10 Essential (primary) hypertension: Secondary | ICD-10-CM

## 2018-08-12 ENCOUNTER — Other Ambulatory Visit: Payer: Self-pay | Admitting: Family Medicine

## 2018-08-12 DIAGNOSIS — I10 Essential (primary) hypertension: Secondary | ICD-10-CM

## 2018-08-12 MED ORDER — LOSARTAN POTASSIUM 50 MG PO TABS: 50.0000 mg | ORAL_TABLET | Freq: Every day | ORAL | 1 refills | Status: DC

## 2018-08-12 MED ORDER — HYDROCHLOROTHIAZIDE 12.5 MG PO CAPS: 12.5000 mg | ORAL_CAPSULE | Freq: Every day | ORAL | 1 refills | Status: DC

## 2018-08-12 NOTE — Telephone Encounter (Signed)
Patient called to request refill on these (2) medications : ---- Patient is completely OUT of Rx  hydrochlorothiazide (MICROZIDE) 12.5 MG capsule [[id number]]   Order Details Dose: 12.5 mg Route: Oral Frequency: Daily  Dispense Quantity: 90 capsule Refills: 1 Fills remaining: --        Sig: Take 1 capsule (12.5 mg total) by mouth daily.     &   losartan (COZAAR) 50 MG tablet [[id number]]   Order Details Dose: 50 mg Route: Oral Frequency: Daily  Dispense Quantity: 90 tablet Refills: 1 Fills remaining: --        Sig: Take 1 tablet (50 mg total) by mouth daily.     --- Forwarding request to medical sst that if approved send to :   [facility] [address] [place] [phone] (Phone) [phone] (Fax)   --glh

## 2018-11-11 ENCOUNTER — Other Ambulatory Visit: Payer: Self-pay

## 2018-11-11 DIAGNOSIS — I10 Essential (primary) hypertension: Secondary | ICD-10-CM

## 2019-02-05 ENCOUNTER — Other Ambulatory Visit: Payer: Self-pay | Admitting: Family Medicine

## 2019-02-05 DIAGNOSIS — I10 Essential (primary) hypertension: Secondary | ICD-10-CM

## 2019-02-05 DIAGNOSIS — E785 Hyperlipidemia, unspecified: Secondary | ICD-10-CM

## 2019-02-05 DIAGNOSIS — E781 Pure hyperglyceridemia: Secondary | ICD-10-CM

## 2019-02-06 ENCOUNTER — Other Ambulatory Visit: Payer: Self-pay | Admitting: Family Medicine

## 2019-02-06 DIAGNOSIS — E785 Hyperlipidemia, unspecified: Secondary | ICD-10-CM

## 2019-02-06 DIAGNOSIS — E781 Pure hyperglyceridemia: Secondary | ICD-10-CM

## 2019-02-07 ENCOUNTER — Other Ambulatory Visit: Payer: Self-pay

## 2019-02-07 ENCOUNTER — Other Ambulatory Visit: Payer: Self-pay | Admitting: Family Medicine

## 2019-02-07 DIAGNOSIS — E559 Vitamin D deficiency, unspecified: Secondary | ICD-10-CM

## 2019-02-07 DIAGNOSIS — E781 Pure hyperglyceridemia: Secondary | ICD-10-CM

## 2019-02-07 DIAGNOSIS — E785 Hyperlipidemia, unspecified: Secondary | ICD-10-CM

## 2019-02-07 DIAGNOSIS — I10 Essential (primary) hypertension: Secondary | ICD-10-CM

## 2019-02-07 NOTE — Patient Outreach (Signed)
[facility] [facility]) Care Management  02/07/2019  Dana Hodge 22-Dec-1950 [id number]   Medication Adherence call to Dana Hodge Hippa Identifiers Verify spoke with patient she is past due on Atorvastatin 20 mg,patient explain she takes 1 tablet daily,patient ask to call [facility] to order this medication,[facility] will have it ready for patient to pick,patient ask for the [facility] number she wants to start receiving her medication thru mail order for next time. Dana Hodge, Dana Hodge is showing past due under [place].  [place] Management Direct Dial [phone]  Fax [phone] [email or web address]

## 2019-03-11 ENCOUNTER — Other Ambulatory Visit: Payer: Self-pay | Admitting: Family Medicine

## 2019-03-11 DIAGNOSIS — I10 Essential (primary) hypertension: Secondary | ICD-10-CM

## 2019-03-19 ENCOUNTER — Telehealth: Payer: Self-pay | Admitting: Family Medicine

## 2019-03-19 DIAGNOSIS — I10 Essential (primary) hypertension: Secondary | ICD-10-CM

## 2019-03-19 MED ORDER — LOSARTAN POTASSIUM 50 MG PO TABS: 50.0000 mg | ORAL_TABLET | Freq: Every day | ORAL | 0 refills | Status: DC

## 2019-03-19 NOTE — Addendum Note (Signed)
Addended by: Mickel Crow on: 03/19/2019 10:18 AM   Modules accepted: Orders

## 2019-03-19 NOTE — Telephone Encounter (Signed)
Patient is aware of the below and medication sent to pharmacy. AS, CMA

## 2019-03-19 NOTE — Telephone Encounter (Signed)
15 d of meds ok. NO more RF until OV

## 2019-03-19 NOTE — Telephone Encounter (Signed)
Patient was last seen 07/31/18 and advised to follow up for physical with [facility]W. Patient last physical was 01/22/2018. Patient scheduled this upcoming CPE on 02/25/19 and is requesting medication refills because she is running out of meds before our next available physical apt.   Please advise. AS, CMA

## 2019-03-19 NOTE — Telephone Encounter (Signed)
At last OV patient was advised to come back CPE and labs. She has made this appt for 04/02/19 (Dr. Colman Cater first available) but was given a small refill of her meds that will not last her til this appt. She is requesting a an additional refill be sent to pharm to help her get by. Please advise if this is something we can do.

## 2019-03-27 ENCOUNTER — Other Ambulatory Visit: Payer: Self-pay

## 2019-03-27 NOTE — Patient Outreach (Signed)
[facility]) Care Management  03/27/2019  CATHERINA VEREB 06-Dec-1950 [id number]   Medication Adherence call to Mrs. Duard Brady Telephone call to Patient regarding Medication Adherence unable to reach patient, patient did not answer patient is due on Losartan 50 mg under [place].   [place] Management Direct Dial [phone]  Fax [phone] [email or web address]

## 2019-04-02 ENCOUNTER — Encounter: Payer: Self-pay | Admitting: Family Medicine

## 2019-04-02 ENCOUNTER — Other Ambulatory Visit: Payer: Self-pay

## 2019-04-02 ENCOUNTER — Ambulatory Visit (INDEPENDENT_AMBULATORY_CARE_PROVIDER_SITE_OTHER): Payer: Medicare Other | Admitting: Family Medicine

## 2019-04-02 ENCOUNTER — Other Ambulatory Visit: Payer: Self-pay | Admitting: Family Medicine

## 2019-04-02 VITALS — BP 131/86 | HR 80 | Temp 98.4°F | Resp 12 | Ht 65.0 in | Wt 177.8 lb

## 2019-04-02 DIAGNOSIS — Z1231 Encounter for screening mammogram for malignant neoplasm of breast: Secondary | ICD-10-CM

## 2019-04-02 DIAGNOSIS — Z1211 Encounter for screening for malignant neoplasm of colon: Secondary | ICD-10-CM

## 2019-04-02 DIAGNOSIS — E663 Overweight: Secondary | ICD-10-CM

## 2019-04-02 DIAGNOSIS — Z0000 Encounter for general adult medical examination without abnormal findings: Secondary | ICD-10-CM

## 2019-04-02 DIAGNOSIS — I10 Essential (primary) hypertension: Secondary | ICD-10-CM | POA: Diagnosis not present

## 2019-04-02 DIAGNOSIS — E559 Vitamin D deficiency, unspecified: Secondary | ICD-10-CM | POA: Diagnosis not present

## 2019-04-02 DIAGNOSIS — Z719 Counseling, unspecified: Secondary | ICD-10-CM | POA: Diagnosis not present

## 2019-04-02 DIAGNOSIS — Z780 Asymptomatic menopausal state: Secondary | ICD-10-CM

## 2019-04-02 DIAGNOSIS — E785 Hyperlipidemia, unspecified: Secondary | ICD-10-CM

## 2019-04-02 DIAGNOSIS — E2839 Other primary ovarian failure: Secondary | ICD-10-CM

## 2019-04-02 DIAGNOSIS — E538 Deficiency of other specified B group vitamins: Secondary | ICD-10-CM | POA: Diagnosis not present

## 2019-04-02 DIAGNOSIS — E782 Mixed hyperlipidemia: Secondary | ICD-10-CM | POA: Diagnosis not present

## 2019-04-02 DIAGNOSIS — R7989 Other specified abnormal findings of blood chemistry: Secondary | ICD-10-CM | POA: Diagnosis not present

## 2019-04-02 DIAGNOSIS — R7303 Prediabetes: Secondary | ICD-10-CM | POA: Diagnosis not present

## 2019-04-02 DIAGNOSIS — E781 Pure hyperglyceridemia: Secondary | ICD-10-CM

## 2019-04-02 DIAGNOSIS — E876 Hypokalemia: Secondary | ICD-10-CM | POA: Diagnosis not present

## 2019-04-02 MED ORDER — LOSARTAN POTASSIUM 50 MG PO TABS: 50.0000 mg | ORAL_TABLET | Freq: Every day | ORAL | 0 refills | Status: DC

## 2019-04-02 MED ORDER — ATORVASTATIN CALCIUM 20 MG PO TABS: 20.0000 mg | ORAL_TABLET | Freq: Every day | ORAL | 0 refills | Status: DC

## 2019-04-02 MED ORDER — VITAMIN D (ERGOCALCIFEROL) 1.25 MG (50000 UNIT) PO CAPS: 50000.0000 [IU] | ORAL_CAPSULE | ORAL | 3 refills | Status: DC

## 2019-04-02 NOTE — Patient Instructions (Addendum)
Preventive Care for Adults, Female  A healthy lifestyle and preventive care can promote health and wellness. Preventive health guidelines for women include the following key practices.   A routine yearly physical is a good way to check with your health care provider about your health and preventive screening. It is a chance to share any concerns and updates on your health and to receive a thorough exam.   Visit your dentist for a routine exam and preventive care every 6 months. Brush your teeth twice a day and floss once a day. Good oral hygiene prevents tooth decay and gum disease.   The frequency of eye exams is based on your age, health, family medical history, use of contact lenses, and other factors. Follow your health care provider's recommendations for frequency of eye exams.   Eat a healthy diet. Foods like vegetables, fruits, whole grains, low-fat dairy products, and lean protein foods contain the nutrients you need without too many calories. Decrease your intake of foods high in solid fats, added sugars, and salt. Eat the right amount of calories for you.Get information about a proper diet from your health care provider, if necessary.   Regular physical exercise is one of the most important things you can do for your health. Most adults should get at least 150 minutes of moderate-intensity exercise (any activity that increases your heart rate and causes you to sweat) each week. In addition, most adults need muscle-strengthening exercises on 2 or more days a week.   Maintain a healthy weight. The body mass index (BMI) is a screening tool to identify possible weight problems. It provides an estimate of body fat based on height and weight. Your health care provider can find your BMI, and can help you achieve or maintain a healthy weight.For adults 20 years and older:   - A BMI below 18.5 is considered underweight.   - A BMI of 18.5 to 24.9 is normal.   - A BMI of 25 to 29.9 is  considered overweight.   - A BMI of 30 and above is considered obese.   Maintain normal blood lipids and cholesterol levels by exercising and minimizing your intake of trans and saturated fats.  Eat a balanced diet with plenty of fruit and vegetables. Blood tests for lipids and cholesterol should begin at age 20 and be repeated every 5 years minimum.  If your lipid or cholesterol levels are high, you are over 40, or you are at high risk for heart disease, you may need your cholesterol levels checked more frequently.Ongoing high lipid and cholesterol levels should be treated with medicines if diet and exercise are not working.   If you smoke, find out from your health care provider how to quit. If you do not use tobacco, do not start.   Lung cancer screening is recommended for adults aged 55-80 years who are at high risk for developing lung cancer because of a history of smoking. A yearly low-dose CT scan of the lungs is recommended for people who have at least a 30-pack-year history of smoking and are a current smoker or have quit within the past 15 years. A pack year of smoking is smoking an average of 1 pack of cigarettes a day for 1 year (for example: 1 pack a day for 30 years or 2 packs a day for 15 years). Yearly screening should continue until the smoker has stopped smoking for at least 15 years. Yearly screening should be stopped for people who develop a   health problem that would prevent them from having lung cancer treatment.   If you are pregnant, do not drink alcohol. If you are breastfeeding, be very cautious about drinking alcohol. If you are not pregnant and choose to drink alcohol, do not have more than 1 drink per day. One drink is considered to be 12 ounces (355 mL) of beer, 5 ounces (148 mL) of wine, or 1.5 ounces (44 mL) of liquor.   Avoid use of street drugs. Do not share needles with anyone. Ask for help if you need support or instructions about stopping the use of  drugs.   High blood pressure causes heart disease and increases the risk of stroke. Your blood pressure should be checked at least yearly.  Ongoing high blood pressure should be treated with medicines if weight loss and exercise do not work.   If you are 55-79 years old, ask your health care provider if you should take aspirin to prevent strokes.   Diabetes screening involves taking a blood sample to check your fasting blood sugar level. This should be done once every 3 years, after age 45, if you are within normal weight and without risk factors for diabetes. Testing should be considered at a younger age or be carried out more frequently if you are overweight and have at least 1 risk factor for diabetes.   Breast cancer screening is essential preventive care for women. You should practice "breast self-awareness."  This means understanding the normal appearance and feel of your breasts and may include breast self-examination.  Any changes detected, no matter how small, should be reported to a health care provider.  Women in their 20s and 30s should have a clinical breast exam (CBE) by a health care provider as part of a regular health exam every 1 to 3 years.  After age 40, women should have a CBE every year.  Starting at age 40, women should consider having a mammogram (breast X-ray test) every year.  Women who have a family history of breast cancer should talk to their health care provider about genetic screening.  Women at a high risk of breast cancer should talk to their health care providers about having an MRI and a mammogram every year.   -Breast cancer gene (BRCA)-related cancer risk assessment is recommended for women who have family members with BRCA-related cancers. BRCA-related cancers include breast, ovarian, tubal, and peritoneal cancers. Having family members with these cancers may be associated with an increased risk for harmful changes (mutations) in the breast cancer genes BRCA1 and  BRCA2. Results of the assessment will determine the need for genetic counseling and BRCA1 and BRCA2 testing.   The Pap test is a screening test for cervical cancer. A Pap test can show cell changes on the cervix that might become cervical cancer if left untreated. A Pap test is a procedure in which cells are obtained and examined from the lower end of the uterus (cervix).   - Women should have a Pap test starting at age 21.   - Between ages 21 and 29, Pap tests should be repeated every 2 years.   - Beginning at age 30, you should have a Pap test every 3 years as long as the past 3 Pap tests have been normal.   - Some women have medical problems that increase the chance of getting cervical cancer. Talk to your health care provider about these problems. It is especially important to talk to your health care provider if a   new problem develops soon after your last Pap test. In these cases, your health care provider may recommend more frequent screening and Pap tests.   - The above recommendations are the same for women who have or have not gotten the vaccine for human papillomavirus (HPV).   - If you had a hysterectomy for a problem that was not cancer or a condition that could lead to cancer, then you no longer need Pap tests. Even if you no longer need a Pap test, a regular exam is a good idea to make sure no other problems are starting.   - If you are between ages 65 and 70 years, and you have had normal Pap tests going back 10 years, you no longer need Pap tests. Even if you no longer need a Pap test, a regular exam is a good idea to make sure no other problems are starting.   - If you have had past treatment for cervical cancer or a condition that could lead to cancer, you need Pap tests and screening for cancer for at least 20 years after your treatment.   - If Pap tests have been discontinued, risk factors (such as a new sexual partner) need to be reassessed to determine if screening should  be resumed.   - The HPV test is an additional test that may be used for cervical cancer screening. The HPV test looks for the virus that can cause the cell changes on the cervix. The cells collected during the Pap test can be tested for HPV. The HPV test could be used to screen women aged 30 years and older, and should be used in women of any age who have unclear Pap test results. After the age of 30, women should have HPV testing at the same frequency as a Pap test.   Colorectal cancer can be detected and often prevented. Most routine colorectal cancer screening begins at the age of 50 years and continues through age 75 years. However, your health care provider may recommend screening at an earlier age if you have risk factors for colon cancer. On a yearly basis, your health care provider may provide home test kits to check for hidden blood in the stool.  Use of a small camera at the end of a tube, to directly examine the colon (sigmoidoscopy or colonoscopy), can detect the earliest forms of colorectal cancer. Talk to your health care provider about this at age 50, when routine screening begins. Direct exam of the colon should be repeated every 5 -10 years through age 75 years, unless early forms of pre-cancerous polyps or small growths are found.   People who are at an increased risk for hepatitis B should be screened for this virus. You are considered at high risk for hepatitis B if:  -You were born in a country where hepatitis B occurs often. Talk with your health care provider about which countries are considered high risk.  - Your parents were born in a high-risk country and you have not received a shot to protect against hepatitis B (hepatitis B vaccine).  - You have HIV or AIDS.  - You use needles to inject street drugs.  - You live with, or have sex with, someone who has Hepatitis B.  - You get hemodialysis treatment.  - You take certain medicines for conditions like cancer, organ  transplantation, and autoimmune conditions.   Hepatitis C blood testing is recommended for all people born from 1945 through 1965 and any individual   with known risks for hepatitis C.   Practice safe sex. Use condoms and avoid high-risk sexual practices to reduce the spread of sexually transmitted infections (STIs). STIs include gonorrhea, chlamydia, syphilis, trichomonas, herpes, HPV, and human immunodeficiency virus (HIV). Herpes, HIV, and HPV are viral illnesses that have no cure. They can result in disability, cancer, and death. Sexually active women aged 25 years and younger should be checked for chlamydia. Older women with new or multiple partners should also be tested for chlamydia. Testing for other STIs is recommended if you are sexually active and at increased risk.   Osteoporosis is a disease in which the bones lose minerals and strength with aging. This can result in serious bone fractures or breaks. The risk of osteoporosis can be identified using a bone density scan. Women ages 65 years and over and women at risk for fractures or osteoporosis should discuss screening with their health care providers. Ask your health care provider whether you should take a calcium supplement or vitamin D to There are also several preventive steps women can take to avoid osteoporosis and resulting fractures or to keep osteoporosis from worsening. -->Recommendations include:  Eat a balanced diet high in fruits, vegetables, calcium, and vitamins.  Get enough calcium. The recommended total intake of is 1,200 mg daily; for best absorption, if taking supplements, divide doses into 250-500 mg doses throughout the day. Of the two types of calcium, calcium carbonate is best absorbed when taken with food but calcium citrate can be taken on an empty stomach.  Get enough vitamin D. NAMS and the National Osteoporosis Foundation recommend at least 1,000 IU per day for women age 50 and over who are at risk of vitamin D  deficiency. Vitamin D deficiency can be caused by inadequate sun exposure (for example, those who live in northern latitudes).  Avoid alcohol and smoking. Heavy alcohol intake (more than 7 drinks per week) increases the risk of falls and hip fracture and women smokers tend to lose bone more rapidly and have lower bone mass than nonsmokers. Stopping smoking is one of the most important changes women can make to improve their health and decrease risk for disease.  Be physically active every day. Weight-bearing exercise (for example, fast walking, hiking, jogging, and weight training) may strengthen bones or slow the rate of bone loss that comes with aging. Balancing and muscle-strengthening exercises can reduce the risk of falling and fracture.  Consider therapeutic medications. Currently, several types of effective drugs are available. Healthcare providers can recommend the type most appropriate for each woman.  Eliminate environmental factors that may contribute to accidents. Falls cause nearly 90% of all osteoporotic fractures, so reducing this risk is an important bone-health strategy. Measures include ample lighting, removing obstructions to walking, using nonskid rugs on floors, and placing mats and/or grab bars in showers.  Be aware of medication side effects. Some common medicines make bones weaker. These include a type of steroid drug called glucocorticoids used for arthritis and asthma, some antiseizure drugs, certain sleeping pills, treatments for endometriosis, and some cancer drugs. An overactive thyroid gland or using too much thyroid hormone for an underactive thyroid can also be a problem. If you are taking these medicines, talk to your doctor about what you can do to help protect your bones.reduce the rate of osteoporosis.    Menopause can be associated with physical symptoms and risks. Hormone replacement therapy is available to decrease symptoms and risks. You should talk to your  health care provider   about whether hormone replacement therapy is right for you.   Use sunscreen. Apply sunscreen liberally and repeatedly throughout the day. You should seek shade when your shadow is shorter than you. Protect yourself by wearing long sleeves, pants, a wide-brimmed hat, and sunglasses year round, whenever you are outdoors.   Once a month, do a whole body skin exam, using a mirror to look at the skin on your back. Tell your health care provider of new moles, moles that have irregular borders, moles that are larger than a pencil eraser, or moles that have changed in shape or color.   -Stay current with required vaccines (immunizations).   Influenza vaccine. All adults should be immunized every year.  Tetanus, diphtheria, and acellular pertussis (Td, Tdap) vaccine. Pregnant women should receive 1 dose of Tdap vaccine during each pregnancy. The dose should be obtained regardless of the length of time since the last dose. Immunization is preferred during the 27th 36th week of gestation. An adult who has not previously received Tdap or who does not know her vaccine status should receive 1 dose of Tdap. This initial dose should be followed by tetanus and diphtheria toxoids (Td) booster doses every 10 years. Adults with an unknown or incomplete history of completing a 3-dose immunization series with Td-containing vaccines should begin or complete a primary immunization series including a Tdap dose. Adults should receive a Td booster every 10 years.  Varicella vaccine. An adult without evidence of immunity to varicella should receive 2 doses or a second dose if she has previously received 1 dose. Pregnant females who do not have evidence of immunity should receive the first dose after pregnancy. This first dose should be obtained before leaving the health care facility. The second dose should be obtained 4 8 weeks after the first dose.  Human papillomavirus (HPV) vaccine. Females aged 13 26  years who have not received the vaccine previously should obtain the 3-dose series. The vaccine is not recommended for use in pregnant females. However, pregnancy testing is not needed before receiving a dose. If a female is found to be pregnant after receiving a dose, no treatment is needed. In that case, the remaining doses should be delayed until after the pregnancy. Immunization is recommended for any person with an immunocompromised condition through the age of 26 years if she did not get any or all doses earlier. During the 3-dose series, the second dose should be obtained 4 8 weeks after the first dose. The third dose should be obtained 24 weeks after the first dose and 16 weeks after the second dose.  Zoster vaccine. One dose is recommended for adults aged 60 years or older unless certain conditions are present.  Measles, mumps, and rubella (MMR) vaccine. Adults born before 1957 generally are considered immune to measles and mumps. Adults born in 1957 or later should have 1 or more doses of MMR vaccine unless there is a contraindication to the vaccine or there is laboratory evidence of immunity to each of the three diseases. A routine second dose of MMR vaccine should be obtained at least 28 days after the first dose for students attending postsecondary schools, health care workers, or international travelers. People who received inactivated measles vaccine or an unknown type of measles vaccine during 1963 1967 should receive 2 doses of MMR vaccine. People who received inactivated mumps vaccine or an unknown type of mumps vaccine before 1979 and are at high risk for mumps infection should consider immunization with 2 doses of   MMR vaccine. For females of childbearing age, rubella immunity should be determined. If there is no evidence of immunity, females who are not pregnant should be vaccinated. If there is no evidence of immunity, females who are pregnant should delay immunization until after pregnancy.  Unvaccinated health care workers born before 1957 who lack laboratory evidence of measles, mumps, or rubella immunity or laboratory confirmation of disease should consider measles and mumps immunization with 2 doses of MMR vaccine or rubella immunization with 1 dose of MMR vaccine.  Pneumococcal 13-valent conjugate (PCV13) vaccine. When indicated, a person who is uncertain of her immunization history and has no record of immunization should receive the PCV13 vaccine. An adult aged 19 years or older who has certain medical conditions and has not been previously immunized should receive 1 dose of PCV13 vaccine. This PCV13 should be followed with a dose of pneumococcal polysaccharide (PPSV23) vaccine. The PPSV23 vaccine dose should be obtained at least 8 weeks after the dose of PCV13 vaccine. An adult aged 19 years or older who has certain medical conditions and previously received 1 or more doses of PPSV23 vaccine should receive 1 dose of PCV13. The PCV13 vaccine dose should be obtained 1 or more years after the last PPSV23 vaccine dose.  Pneumococcal polysaccharide (PPSV23) vaccine. When PCV13 is also indicated, PCV13 should be obtained first. All adults aged 65 years and older should be immunized. An adult younger than age 65 years who has certain medical conditions should be immunized. Any person who resides in a nursing home or long-term care facility should be immunized. An adult smoker should be immunized. People with an immunocompromised condition and certain other conditions should receive both PCV13 and PPSV23 vaccines. People with human immunodeficiency virus (HIV) infection should be immunized as soon as possible after diagnosis. Immunization during chemotherapy or radiation therapy should be avoided. Routine use of PPSV23 vaccine is not recommended for American Indians, [place] Natives, or people younger than 65 years unless there are medical conditions that require PPSV23 vaccine. When indicated,  people who have unknown immunization and have no record of immunization should receive PPSV23 vaccine. One-time revaccination 5 years after the first dose of PPSV23 is recommended for people aged 19 64 years who have chronic kidney failure, nephrotic syndrome, asplenia, or immunocompromised conditions. People who received 1 2 doses of PPSV23 before age 65 years should receive another dose of PPSV23 vaccine at age 65 years or later if at least 5 years have passed since the previous dose. Doses of PPSV23 are not needed for people immunized with PPSV23 at or after age 65 years.  Meningococcal vaccine. Adults with asplenia or persistent complement component deficiencies should receive 2 doses of quadrivalent meningococcal conjugate (MenACWY-D) vaccine. The doses should be obtained at least 2 months apart. Microbiologists working with certain meningococcal bacteria, military recruits, people at risk during an outbreak, and people who travel to or live in countries with a high rate of meningitis should be immunized. A first-year college student up through age 21 years who is living in a residence hall should receive a dose if she did not receive a dose on or after her 16th birthday. Adults who have certain high-risk conditions should receive one or more doses of vaccine.  Hepatitis A vaccine. Adults who wish to be protected from this disease, have certain high-risk conditions, work with hepatitis A-infected animals, work in hepatitis A research labs, or travel to or work in countries with a high rate of hepatitis A should be   immunized. Adults who were previously unvaccinated and who anticipate close contact with an international adoptee during the first 60 days after arrival in the [place] from a country with a high rate of hepatitis A should be immunized.  Hepatitis B vaccine.  Adults who wish to be protected from this disease, have certain high-risk conditions, may be exposed to blood or other infectious  body fluids, are household contacts or sex partners of hepatitis B positive people, are clients or workers in certain care facilities, or travel to or work in countries with a high rate of hepatitis B should be immunized.  Haemophilus influenzae type b (Hib) vaccine. A previously unvaccinated person with asplenia or sickle cell disease or having a scheduled splenectomy should receive 1 dose of Hib vaccine. Regardless of previous immunization, a recipient of a hematopoietic stem cell transplant should receive a 3-dose series 6 12 months after her successful transplant. Hib vaccine is not recommended for adults with HIV infection.  Preventive Services / Frequency Ages 19 to 39years  Blood pressure check.** / Every 1 to 2 years.  Lipid and cholesterol check.** / Every 5 years beginning at age 20.  Clinical breast exam.** / Every 3 years for women in their 20s and 30s.  BRCA-related cancer risk assessment.** / For women who have family members with a BRCA-related cancer (breast, ovarian, tubal, or peritoneal cancers).  Pap test.** / Every 2 years from ages 21 through 29. Every 3 years starting at age 30 through age 65 or 70 with a history of 3 consecutive normal Pap tests.  HPV screening.** / Every 3 years from ages 30 through ages 65 to 70 with a history of 3 consecutive normal Pap tests.  Hepatitis C blood test.** / For any individual with known risks for hepatitis C.  Skin self-exam. / Monthly.  Influenza vaccine. / Every year.  Tetanus, diphtheria, and acellular pertussis (Tdap, Td) vaccine.** / Consult your health care provider. Pregnant women should receive 1 dose of Tdap vaccine during each pregnancy. 1 dose of Td every 10 years.  Varicella vaccine.** / Consult your health care provider. Pregnant females who do not have evidence of immunity should receive the first dose after pregnancy.  HPV vaccine. / 3 doses over 6 months, if 26 and younger. The vaccine is not recommended for use in  pregnant females. However, pregnancy testing is not needed before receiving a dose.  Measles, mumps, rubella (MMR) vaccine.** / You need at least 1 dose of MMR if you were born in 1957 or later. You may also need a 2nd dose. For females of childbearing age, rubella immunity should be determined. If there is no evidence of immunity, females who are not pregnant should be vaccinated. If there is no evidence of immunity, females who are pregnant should delay immunization until after pregnancy.  Pneumococcal 13-valent conjugate (PCV13) vaccine.** / Consult your health care provider.  Pneumococcal polysaccharide (PPSV23) vaccine.** / 1 to 2 doses if you smoke cigarettes or if you have certain conditions.  Meningococcal vaccine.** / 1 dose if you are age 19 to 21 years and a first-year college student living in a residence hall, or have one of several medical conditions, you need to get vaccinated against meningococcal disease. You may also need additional booster doses.  Hepatitis A vaccine.** / Consult your health care provider.  Hepatitis B vaccine.** / Consult your health care provider.  Haemophilus influenzae type b (Hib) vaccine.** / Consult your health care provider.  Ages 40 to 64years    Blood pressure check.** / Every 1 to 2 years.  Lipid and cholesterol check.** / Every 5 years beginning at age 20 years.  Lung cancer screening. / Every year if you are aged 55 80 years and have a 30-pack-year history of smoking and currently smoke or have quit within the past 15 years. Yearly screening is stopped once you have quit smoking for at least 15 years or develop a health problem that would prevent you from having lung cancer treatment.  Clinical breast exam.** / Every year after age 40 years.  BRCA-related cancer risk assessment.** / For women who have family members with a BRCA-related cancer (breast, ovarian, tubal, or peritoneal cancers).  Mammogram.** / Every year beginning at age 40  years and continuing for as long as you are in good health. Consult with your health care provider.  Pap test.** / Every 3 years starting at age 30 years through age 65 or 70 years with a history of 3 consecutive normal Pap tests.  HPV screening.** / Every 3 years from ages 30 years through ages 65 to 70 years with a history of 3 consecutive normal Pap tests.  Fecal occult blood test (FOBT) of stool. / Every year beginning at age 50 years and continuing until age 75 years. You may not need to do this test if you get a colonoscopy every 10 years.  Flexible sigmoidoscopy or colonoscopy.** / Every 5 years for a flexible sigmoidoscopy or every 10 years for a colonoscopy beginning at age 50 years and continuing until age 75 years.  Hepatitis C blood test.** / For all people born from 1945 through 1965 and any individual with known risks for hepatitis C.  Skin self-exam. / Monthly.  Influenza vaccine. / Every year.  Tetanus, diphtheria, and acellular pertussis (Tdap/Td) vaccine.** / Consult your health care provider. Pregnant women should receive 1 dose of Tdap vaccine during each pregnancy. 1 dose of Td every 10 years.  Varicella vaccine.** / Consult your health care provider. Pregnant females who do not have evidence of immunity should receive the first dose after pregnancy.  Zoster vaccine.** / 1 dose for adults aged 60 years or older.  Measles, mumps, rubella (MMR) vaccine.** / You need at least 1 dose of MMR if you were born in 1957 or later. You may also need a 2nd dose. For females of childbearing age, rubella immunity should be determined. If there is no evidence of immunity, females who are not pregnant should be vaccinated. If there is no evidence of immunity, females who are pregnant should delay immunization until after pregnancy.  Pneumococcal 13-valent conjugate (PCV13) vaccine.** / Consult your health care provider.  Pneumococcal polysaccharide (PPSV23) vaccine.** / 1 to 2 doses if  you smoke cigarettes or if you have certain conditions.  Meningococcal vaccine.** / Consult your health care provider.  Hepatitis A vaccine.** / Consult your health care provider.  Hepatitis B vaccine.** / Consult your health care provider.  Haemophilus influenzae type b (Hib) vaccine.** / Consult your health care provider.  Ages 65 years and over  Blood pressure check.** / Every 1 to 2 years.  Lipid and cholesterol check.** / Every 5 years beginning at age 20 years.  Lung cancer screening. / Every year if you are aged 55 80 years and have a 30-pack-year history of smoking and currently smoke or have quit within the past 15 years. Yearly screening is stopped once you have quit smoking for at least 15 years or develop a health problem that   would prevent you from having lung cancer treatment.  Clinical breast exam.** / Every year after age 40 years.  BRCA-related cancer risk assessment.** / For women who have family members with a BRCA-related cancer (breast, ovarian, tubal, or peritoneal cancers).  Mammogram.** / Every year beginning at age 40 years and continuing for as long as you are in good health. Consult with your health care provider.  Pap test.** / Every 3 years starting at age 30 years through age 65 or 70 years with 3 consecutive normal Pap tests. Testing can be stopped between 65 and 70 years with 3 consecutive normal Pap tests and no abnormal Pap or HPV tests in the past 10 years.  HPV screening.** / Every 3 years from ages 30 years through ages 65 or 70 years with a history of 3 consecutive normal Pap tests. Testing can be stopped between 65 and 70 years with 3 consecutive normal Pap tests and no abnormal Pap or HPV tests in the past 10 years.  Fecal occult blood test (FOBT) of stool. / Every year beginning at age 50 years and continuing until age 75 years. You may not need to do this test if you get a colonoscopy every 10 years.  Flexible sigmoidoscopy or colonoscopy.** /  Every 5 years for a flexible sigmoidoscopy or every 10 years for a colonoscopy beginning at age 50 years and continuing until age 75 years.  Hepatitis C blood test.** / For all people born from 1945 through 1965 and any individual with known risks for hepatitis C.  Osteoporosis screening.** / A one-time screening for women ages 65 years and over and women at risk for fractures or osteoporosis.  Skin self-exam. / Monthly.  Influenza vaccine. / Every year.  Tetanus, diphtheria, and acellular pertussis (Tdap/Td) vaccine.** / 1 dose of Td every 10 years.  Varicella vaccine.** / Consult your health care provider.  Zoster vaccine.** / 1 dose for adults aged 60 years or older.  Pneumococcal 13-valent conjugate (PCV13) vaccine.** / Consult your health care provider.  Pneumococcal polysaccharide (PPSV23) vaccine.** / 1 dose for all adults aged 65 years and older.  Meningococcal vaccine.** / Consult your health care provider.  Hepatitis A vaccine.** / Consult your health care provider.  Hepatitis B vaccine.** / Consult your health care provider.  Haemophilus influenzae type b (Hib) vaccine.** / Consult your health care provider. ** Family history and personal history of risk and conditions may change your health care provider's recommendations. Document Released: 04/04/2001 Document Revised: 11/27/2012  ExitCare Patient Information 2014 [employer]   EXERCISE AND DIET:  We recommended that you start or continue a regular exercise program for good health. Regular exercise means any activity that makes your heart beat faster and makes you sweat.  We recommend exercising at least 30 minutes per day at least 3 days a week, preferably 5.  We also recommend a diet low in fat and sugar / carbohydrates.  Inactivity, poor dietary choices and obesity can cause diabetes, heart attack, stroke, and kidney damage, among others.     ALCOHOL AND SMOKING:  Women should limit their alcohol intake to no  more than 7 drinks/beers/glasses of wine (combined, not each!) per week. Moderation of alcohol intake to this level decreases your risk of breast cancer and liver damage.  ( And of course, no recreational drugs are part of a healthy lifestyle.)  Also, you should not be smoking at all or even being exposed to second hand smoke. Most people know smoking can   cause cancer, and various heart and lung diseases, but did you know it also contributes to weakening of your bones?  Aging of your skin?  Yellowing of your teeth and nails?   CALCIUM AND VITAMIN D:  Adequate intake of calcium and Vitamin D are recommended.  The recommendations for exact amounts of these supplements seem to change often, but generally speaking 600 mg of calcium (either carbonate or citrate) and 800 units of Vitamin D per day seems prudent. Certain women may benefit from higher intake of Vitamin D.  If you are among these women, your doctor will have told you during your visit.     PAP SMEARS:  Pap smears, to check for cervical cancer or precancers,  have traditionally been done yearly, although recent scientific advances have shown that most women can have pap smears less often.  However, every woman still should have a physical exam from her gynecologist or primary care physician every year. It will include a breast check, inspection of the vulva and vagina to check for abnormal growths or skin changes, a visual exam of the cervix, and then an exam to evaluate the size and shape of the uterus and ovaries.  And after 67 years of age, a rectal exam is indicated to check for rectal cancers. We will also provide age appropriate advice regarding health maintenance, like when you should have certain vaccines, screening for sexually transmitted diseases, bone density testing, colonoscopy, mammograms, etc.    MAMMOGRAMS:  All women over 40 years old should have a yearly mammogram. Many facilities now offer a "3D" mammogram, which may cost  around $50 extra out of pocket. If possible,  we recommend you accept the option to have the 3D mammogram performed.  It both reduces the number of women who will be called back for extra views which then turn out to be normal, and it is better than the routine mammogram at detecting truly abnormal areas.     COLONOSCOPY:  Colonoscopy to screen for colon cancer is recommended for all women at age 50.  We know, you hate the idea of the prep.  We agree, BUT, having colon cancer and not knowing it is worse!!  Colon cancer so often starts as a polyp that can be seen and removed at colonscopy, which can quite literally save your life!  And if your first colonoscopy is normal and you have no family history of colon cancer, most women don't have to have it again for 10 years.  Once every ten years, you can do something that may end up saving your life, right?  We will be happy to help you get it scheduled when you are ready.  Be sure to check your insurance coverage so you understand how much it will cost.  It may be covered as a preventative service at no cost, but you should check your particular policy.   

## 2019-04-02 NOTE — Progress Notes (Signed)
Female Physical   Impression and Recommendations:    1. Encounter for wellness examination   2. Overweight (BMI 25.0-29.9)   3. Health education/counseling   4. Encounter for screening mammogram for malignant neoplasm of breast   5. Screening for colon cancer   6. Hyperlipidemia, unspecified hyperlipidemia type-10-year risk greater than 10%   7. Hypertriglyceridemia   8. Essential hypertension   9. Vitamin D deficiency   10. Post-menopausal   11. Estrogen deficiency     1) Anticipatory Guidance: Discussed importance of wearing a seatbelt while driving, not texting while driving; sunscreen when outside along with yearly skin surveillance; eating a well balanced and modest diet; physical activity at least 25 minutes per day or 150 min/ week of moderate to intense activity.  2) Immunizations / Screenings / Labs:   -Counseling and Covid vaccine discussed with patient.  Encouraged to obtain it. -She may need to come in sometime in the near future for her shingles vaccine as well as her pneumonia vaccines.  She wishes to hold off on the flu since she will be getting the Covid soon All immunizations and screenings that patient agrees to, are up-to-date per recommendations or will be updated today.  Patient understands the needs for q 31mo dental and yearly vision screens which pt will schedule independently. Obtain CBC, CMP, HgA1c, Lipid panel, TSH and vit D when fasting if not already done recently.   3) Weight:   Discussed weight loss briefly.  Encouraged to improve nutrient density of diet through increasing intake of fruits and vegetables and decreasing saturated/trans fats, white flour products and refined sugar products.    Meds ordered this encounter  Medications  . atorvastatin (LIPITOR) 20 MG tablet    Sig: Take 1 tablet (20 mg total) by mouth at bedtime.    Dispense:  90 tablet    Refill:  0  . losartan (COZAAR) 50 MG tablet    Sig: Take 1 tablet (50 mg total) by mouth  daily.    Dispense:  90 tablet    Refill:  0  . Vitamin D, Ergocalciferol, (DRISDOL) 1.25 MG (50000 UNIT) CAPS capsule    Sig: Take 1 capsule (50,000 Units total) by mouth every 7 (seven) days.    Dispense:  12 capsule    Refill:  3    Orders Placed This Encounter  Procedures  . MM Digital Screening  . DG Bone Density  . CBC w/Diff  . Comprehensive metabolic panel  . TSH  . T4, free  . Hemoglobin A1c  . VITAMIN D 25 Hydroxy (Vit-D Deficiency, Fractures)  . Lipid panel  . Ambulatory referral to Gastroenterology     Return for Follow-up acute issues if needed otherwise 4 months for blood pressure, cholesterol etc..   Reminded pt important of f-up preventative CPE in 1 year.  Reminded pt again, this is in addition to any chronic care visits.    Gross side effects, risk and benefits, and alternatives of medications discussed with patient.  Patient is aware that all medications have potential side effects and we are unable to predict every side effect or drug-drug interaction that may occur.  Expresses verbal understanding and consents to current therapy plan and treatment regimen.  F-up preventative CPE in 1 year- reminded pt again, this is in addition to any chronic care visits.    Please see orders placed and AVS handed out to patient at the end of our visit for further patient instructions/ counseling done  pertaining to today's office visit.   Subjective:     CPE HPI: Dana Hodge is a 69 y.o. female who presents to [facility] today a yearly health maintenance exam.   Health Maintenance Summary  - Reviewed and updated, unless pt declines services.  Last Cologuard or Colonoscopy:   7 of 2011.  Due now.  Patient had polyps removed but unfortunately cannot find gastroenterology's report of when to return based on path report done 09/03/2009.  Tobacco History Reviewed:  Y-never smoked. CT scan for screening lung CA:   Not applicable    Alcohol  and/or drug use:    No concerns; no use Exercise Habits:   Not meeting AHA guidelines but recently retired and plans to. Dental Home: Yes goes to see Dr. Jone Baseman every 6 months. Eye exams: Wears glasses and plans to go every 1 to 2 years.  She is due now.   Dermatology home: None.  Patient without any skin concerns.  Years ago had a skin tag removed but no concerns today.    -Advised patient to let us know when she feels she has skin concerns and needs referral.  Advise she should go once yearly at 60+ definitely 60+  Female Health:  PAP Smear - last known results:  no h/o ABN-last done 2 years ago and was within normal limits. STD concerns:   none, monogamous Birth control method:   n/a Menses regular:    n/a; postmenopausal Lumps or breast concerns:    None; patient is due for mammogram and will be going in the very near future Bone/ DEXA scan:   Will be ordered today.   Additional concerns beyond health maintenance issues: Patient has concerns about right arm pain between shoulder and elbow.  This is relatively new and she will make a follow-up to discuss this and evaluate this further.    Immunization History  Administered Date(s) Administered  . Influenza Whole 01/12/2009, 01/05/2010  . Influenza,inj,Quad PF,6+ Mos 01/08/2015, 12/23/2015, 01/22/2018  . Td 01/12/2009  . Tdap 01/22/2018  . Zoster 06/05/2014     Health Maintenance  Topic Date Due  . INFLUENZA VACCINE  09/21/2018  . PNA vac Low Risk Adult (1 of 2 - PCV13) 04/03/2019 (Originally 01/28/2018)  . COLONOSCOPY  09/04/2019  . MAMMOGRAM  02/02/2020  . TETANUS/TDAP  01/23/2028  . DEXA SCAN  Completed  . Hepatitis C Screening  Completed     Wt Readings from Last 3 Encounters:  04/02/19 177 lb 12.8 oz (80.6 kg)  07/31/18 168 lb (76.2 kg)  07/01/18 169 lb 12.8 oz (77 kg)   BP Readings from Last 3 Encounters:  04/02/19 (!) 143/82  07/01/18 135/85  04/02/18 121/82   Pulse Readings from Last 3 Encounters:    04/02/19 80  07/01/18 74  04/02/18 80     Past Medical History:  Diagnosis Date  . Chicken pox   . Colon polyp   . Hyperlipidemia   . Hypertension   . UTI (urinary tract infection)       Past Surgical History:  Procedure Laterality Date  . LAPAROSCOPIC APPENDECTOMY N/A 01/15/2016   Procedure: APPENDECTOMY LAPAROSCOPIC. DRAINAGE OF INTRA ABDOMINAL ABSCESSES X 3, LYSIS OF ADHESIONS;  Surgeon: Michael Boston, MD;  Location: [facility];  Service: General;  Laterality: N/A;      Family History  Problem Relation Age of Onset  . Heart disease Mother 4       CAD  . Hypertension Mother   .  Hyperlipidemia Mother   . Heart disease Father 52       MI  . COPD Father   . Cancer Maternal Uncle        colon, pancreatic  . Cancer Maternal Grandfather        colon, pancreatic  . Hypertension Sister   . Alcoholism Paternal Uncle   . Stroke Maternal Grandmother       Social History   Substance and Sexual Activity  Drug Use No  ,   Social History   Substance and Sexual Activity  Alcohol Use Yes   Comment: a glass of wine with dinner   ,   Social History   Tobacco Use  Smoking Status Never Smoker  Smokeless Tobacco Never Used  ,   Social History   Substance and Sexual Activity  Sexual Activity Yes  . Birth control/protection: None    Current Outpatient Medications on File Prior to Visit  Medication Sig Dispense Refill  . b complex vitamins tablet Take 1 tablet by mouth daily.    . hydrochlorothiazide (MICROZIDE) 12.5 MG capsule Take 1 capsule (12.5 mg total) by mouth daily. 90 capsule 1  . vitamin B-12 (CYANOCOBALAMIN) 500 MCG tablet Take 500 mcg by mouth daily.      No current facility-administered medications on file prior to visit.    Allergies: Patient has no known allergies.  Review of Systems: General:   Denies fever, chills, unexplained weight loss.  Optho/Auditory:   Denies visual changes, blurred vision/LOV Respiratory:   Denies SOB, DOE more than  baseline levels.   Cardiovascular:   Denies chest pain, palpitations, new onset peripheral edema  Gastrointestinal:   Denies nausea, vomiting, diarrhea.  Genitourinary: Denies dysuria, freq/ urgency, flank pain or discharge from genitals.  Endocrine:     Denies hot or cold intolerance, polyuria, polydipsia. Musculoskeletal:   Denies unexplained myalgias, joint swelling, unexplained arthralgias, gait problems.  Skin:  Denies rash, suspicious lesions Neurological:     Denies dizziness, unexplained weakness, numbness  Psychiatric/Behavioral:   Denies mood changes, suicidal or homicidal ideations, hallucinations    Objective:    Blood pressure (!) 143/82, pulse 80, temperature 98.4 F (36.9 C), temperature source Oral, resp. rate 12, height 5\' 5"  (1.651 m), weight 177 lb 12.8 oz (80.6 kg), SpO2 98 %. Body mass index is 29.59 kg/m. General Appearance:    Alert, cooperative, no distress, appears stated age  Head:    Normocephalic, without obvious abnormality, atraumatic  Eyes:    PERRL, conjunctiva/corneas clear, EOM's intact, fundi    benign, both eyes  Ears:    Normal TM's and external ear canals, both ears  Nose:   Nares normal, septum midline, mucosa normal, no drainage    or sinus tenderness  Throat:   Lips w/o lesion, mucosa moist, and tongue normal; teeth and   gums normal  Neck:   Supple, symmetrical, trachea midline, no adenopathy;    thyroid:  no enlargement/tenderness/nodules; no carotid   bruit or JVD  Back:     Symmetric, no curvature, ROM normal, no CVA tenderness  Lungs:     Clear to auscultation bilaterally, respirations unlabored, no       Wh/ R/ R  Chest Wall:    No tenderness or gross deformity; normal excursion   Heart:    Regular rate and rhythm, S1 and S2 normal, no murmur, rub   or gallop  Breast Exam:    No tenderness, masses, or nipple abnormality b/l; no d/c  Abdomen:  Soft, non-tender, bowel sounds active all four quadrants, NO   G/R/R, no masses, no  organomegaly  Genitalia:   Deferred, patient 69 years old and normal risk  Rectal:   Deferred as patient is going for colonoscopy this year  Extremities:   Extremities normal, atraumatic, no cyanosis or gross edema  Pulses:   2+ and symmetric all extremities  Skin:   Warm, dry, Skin color, texture, turgor normal, no obvious rashes or lesions Psych: No HI/SI, judgement and insight good, Euthymic mood. Full Affect.  Neurologic:   CNII-XII intact, normal strength, sensation and reflexes    Throughout

## 2019-04-03 LAB — COMPREHENSIVE METABOLIC PANEL
ALT: 33 IU/L — ABNORMAL HIGH (ref 0–32)
AST: 26 IU/L (ref 0–40)
Albumin/Globulin Ratio: 2 (ref 1.2–2.2)
Albumin: 4.7 g/dL (ref 3.8–4.8)
Alkaline Phosphatase: 78 IU/L (ref 39–117)
BUN/Creatinine Ratio: 13 (ref 12–28)
BUN: 12 mg/dL (ref 8–27)
Bilirubin Total: 0.5 mg/dL (ref 0.0–1.2)
CO2: 26 mmol/L (ref 20–29)
Calcium: 10.3 mg/dL (ref 8.7–10.3)
Chloride: 98 mmol/L (ref 96–106)
Creatinine, Ser: 0.96 mg/dL (ref 0.57–1.00)
GFR calc Af Amer: 71 mL/min/{1.73_m2} (ref >59)
GFR calc non Af Amer: 62 mL/min/{1.73_m2} (ref >59)
Globulin, Total: 2.4 g/dL (ref 1.5–4.5)
Glucose: 95 mg/dL (ref 65–99)
Potassium: 4.5 mmol/L (ref 3.5–5.2)
Sodium: 139 mmol/L (ref 134–144)
Total Protein: 7.1 g/dL (ref 6.0–8.5)

## 2019-04-03 LAB — CBC WITH DIFFERENTIAL/PLATELET
Basophils Absolute: 0.1 10*3/uL (ref 0.0–0.2)
Basos: 1 %
EOS (ABSOLUTE): 0.1 10*3/uL (ref 0.0–0.4)
Eos: 2 %
Hematocrit: 41.8 % (ref 34.0–46.6)
Hemoglobin: 14.7 g/dL (ref 11.1–15.9)
Immature Grans (Abs): 0 10*3/uL (ref 0.0–0.1)
Immature Granulocytes: 0 %
Lymphocytes Absolute: 2.7 10*3/uL (ref 0.7–3.1)
Lymphs: 34 %
MCH: 31.3 pg (ref 26.6–33.0)
MCHC: 35.2 g/dL (ref 31.5–35.7)
MCV: 89 fL (ref 79–97)
Monocytes Absolute: 0.6 10*3/uL (ref 0.1–0.9)
Monocytes: 7 %
Neutrophils Absolute: 4.4 10*3/uL (ref 1.4–7.0)
Neutrophils: 56 %
Platelets: 275 10*3/uL (ref 150–450)
RBC: 4.69 x10E6/uL (ref 3.77–5.28)
RDW: 12.4 % (ref 11.7–15.4)
WBC: 7.9 10*3/uL (ref 3.4–10.8)

## 2019-04-03 LAB — LIPID PANEL
Chol/HDL Ratio: 2.9 ratio (ref 0.0–4.4)
Cholesterol, Total: 180 mg/dL (ref 100–199)
HDL: 62 mg/dL (ref >39)
LDL Chol Calc (NIH): 81 mg/dL (ref 0–99)
Triglycerides: 228 mg/dL — ABNORMAL HIGH (ref 0–149)
VLDL Cholesterol Cal: 37 mg/dL (ref 5–40)

## 2019-04-03 LAB — HEMOGLOBIN A1C
Est. average glucose Bld gHb Est-mCnc: 117 mg/dL
Hgb A1c MFr Bld: 5.7 % — ABNORMAL HIGH (ref 4.8–5.6)

## 2019-04-03 LAB — VITAMIN D 25 HYDROXY (VIT D DEFICIENCY, FRACTURES): Vit D, 25-Hydroxy: 50.3 ng/mL (ref 30.0–100.0)

## 2019-04-03 LAB — TSH: TSH: 2.2 u[IU]/mL (ref 0.450–4.500)

## 2019-04-03 LAB — T4, FREE: Free T4: 1.21 ng/dL (ref 0.82–1.77)

## 2019-04-14 ENCOUNTER — Ambulatory Visit: Payer: Medicare Other | Attending: Internal Medicine

## 2019-04-14 DIAGNOSIS — Z23 Encounter for immunization: Secondary | ICD-10-CM | POA: Insufficient documentation

## 2019-04-14 NOTE — Progress Notes (Signed)
   Covid-19 Vaccination Clinic  Name:  Dana Hodge    MRN: [id number] DOB: 04/01/1950  04/14/2019  [name] was observed post Covid-19 immunization for 15 minutes without incidence. She was provided with Vaccine Information Sheet and instruction to access the V-Safe system.   [name] was instructed to call 911 with any severe reactions post vaccine: Marland Kitchen Difficulty breathing  . Swelling of your face and throat  . A fast heartbeat  . A bad rash all over your body  . Dizziness and weakness    Immunizations Administered    Name Date Dose VIS Date Route   Pfizer COVID-19 Vaccine 04/14/2019  9:52 AM 0.3 mL 01/31/2019 Intramuscular   Manufacturer: [place]   Lot: [id number]   [place]: [id number]

## 2019-05-06 ENCOUNTER — Ambulatory Visit: Payer: Medicare Other | Attending: Internal Medicine

## 2019-05-06 DIAGNOSIS — Z23 Encounter for immunization: Secondary | ICD-10-CM

## 2019-05-06 NOTE — Progress Notes (Signed)
   Covid-19 Vaccination Clinic  Name:  LIGIA SCANLON    MRN: [id number] DOB: 1950-08-16  05/06/2019  Ms. Moustafa was observed post Covid-19 immunization for 15 minutes without incident. She was provided with Vaccine Information Sheet and instruction to access the V-Safe system.   Ms. Lesley was instructed to call 911 with any severe reactions post vaccine: Marland Kitchen Difficulty breathing  . Swelling of face and throat  . A fast heartbeat  . A bad rash all over body  . Dizziness and weakness   Immunizations Administered    Name Date Dose VIS Date Route   Pfizer COVID-19 Vaccine 05/06/2019 10:30 AM 0.3 mL 01/31/2019 Intramuscular   Manufacturer: [place]   Lot: [id number]   [place]: [id number]

## 2019-06-20 ENCOUNTER — Ambulatory Visit
Admission: RE | Admit: 2019-06-20 | Discharge: 2019-06-20 | Disposition: A | Discharge status: Home / Self Care | Payer: Medicare Other | Source: Ambulatory Visit | Attending: Family Medicine | Admitting: Family Medicine

## 2019-06-20 ENCOUNTER — Other Ambulatory Visit: Payer: Self-pay

## 2019-06-20 DIAGNOSIS — E2839 Other primary ovarian failure: Secondary | ICD-10-CM

## 2019-06-20 DIAGNOSIS — Z1231 Encounter for screening mammogram for malignant neoplasm of breast: Secondary | ICD-10-CM | POA: Diagnosis not present

## 2019-06-20 DIAGNOSIS — Z780 Asymptomatic menopausal state: Secondary | ICD-10-CM | POA: Diagnosis not present

## 2019-06-20 DIAGNOSIS — M85852 Other specified disorders of bone density and structure, left thigh: Secondary | ICD-10-CM | POA: Diagnosis not present

## 2019-06-24 ENCOUNTER — Other Ambulatory Visit: Payer: Self-pay | Admitting: Physician Assistant

## 2019-06-24 DIAGNOSIS — Z1211 Encounter for screening for malignant neoplasm of colon: Secondary | ICD-10-CM

## 2019-06-24 DIAGNOSIS — Z1231 Encounter for screening mammogram for malignant neoplasm of breast: Secondary | ICD-10-CM

## 2019-06-24 DIAGNOSIS — I10 Essential (primary) hypertension: Secondary | ICD-10-CM

## 2019-06-24 DIAGNOSIS — E2839 Other primary ovarian failure: Secondary | ICD-10-CM

## 2019-06-24 DIAGNOSIS — E781 Pure hyperglyceridemia: Secondary | ICD-10-CM

## 2019-06-24 DIAGNOSIS — Z780 Asymptomatic menopausal state: Secondary | ICD-10-CM

## 2019-06-24 DIAGNOSIS — E559 Vitamin D deficiency, unspecified: Secondary | ICD-10-CM

## 2019-06-24 DIAGNOSIS — E785 Hyperlipidemia, unspecified: Secondary | ICD-10-CM

## 2019-06-24 MED ORDER — VITAMIN D (ERGOCALCIFEROL) 1.25 MG (50000 UNIT) PO CAPS: 50000.0000 [IU] | ORAL_CAPSULE | ORAL | 1 refills | Status: DC

## 2019-07-11 ENCOUNTER — Other Ambulatory Visit: Payer: Self-pay | Admitting: Family Medicine

## 2019-07-11 DIAGNOSIS — E559 Vitamin D deficiency, unspecified: Secondary | ICD-10-CM

## 2019-07-11 DIAGNOSIS — Z1211 Encounter for screening for malignant neoplasm of colon: Secondary | ICD-10-CM

## 2019-07-11 DIAGNOSIS — E785 Hyperlipidemia, unspecified: Secondary | ICD-10-CM

## 2019-07-11 DIAGNOSIS — I10 Essential (primary) hypertension: Secondary | ICD-10-CM

## 2019-07-11 DIAGNOSIS — E781 Pure hyperglyceridemia: Secondary | ICD-10-CM

## 2019-07-11 DIAGNOSIS — Z780 Asymptomatic menopausal state: Secondary | ICD-10-CM

## 2019-07-11 DIAGNOSIS — E2839 Other primary ovarian failure: Secondary | ICD-10-CM

## 2019-07-11 DIAGNOSIS — Z1231 Encounter for screening mammogram for malignant neoplasm of breast: Secondary | ICD-10-CM

## 2019-07-18 ENCOUNTER — Other Ambulatory Visit: Payer: Self-pay

## 2019-07-18 DIAGNOSIS — E781 Pure hyperglyceridemia: Secondary | ICD-10-CM

## 2019-07-18 DIAGNOSIS — I10 Essential (primary) hypertension: Secondary | ICD-10-CM

## 2019-07-18 DIAGNOSIS — E559 Vitamin D deficiency, unspecified: Secondary | ICD-10-CM

## 2019-07-18 DIAGNOSIS — Z780 Asymptomatic menopausal state: Secondary | ICD-10-CM

## 2019-07-18 DIAGNOSIS — Z1211 Encounter for screening for malignant neoplasm of colon: Secondary | ICD-10-CM

## 2019-07-18 DIAGNOSIS — Z1231 Encounter for screening mammogram for malignant neoplasm of breast: Secondary | ICD-10-CM

## 2019-07-18 DIAGNOSIS — E785 Hyperlipidemia, unspecified: Secondary | ICD-10-CM

## 2019-07-18 DIAGNOSIS — E2839 Other primary ovarian failure: Secondary | ICD-10-CM

## 2019-07-18 MED ORDER — LOSARTAN POTASSIUM 50 MG PO TABS: 50.0000 mg | ORAL_TABLET | Freq: Every day | ORAL | 0 refills | Status: DC

## 2019-07-31 ENCOUNTER — Encounter: Payer: Self-pay | Admitting: Physician Assistant

## 2019-07-31 ENCOUNTER — Ambulatory Visit (INDEPENDENT_AMBULATORY_CARE_PROVIDER_SITE_OTHER): Payer: Medicare Other | Admitting: Physician Assistant

## 2019-07-31 ENCOUNTER — Other Ambulatory Visit: Payer: Self-pay

## 2019-07-31 ENCOUNTER — Encounter: Payer: Self-pay | Admitting: Gastroenterology

## 2019-07-31 VITALS — BP 103/67 | HR 82 | Temp 98.3°F | Ht 65.0 in | Wt 173.4 lb

## 2019-07-31 DIAGNOSIS — I10 Essential (primary) hypertension: Secondary | ICD-10-CM | POA: Diagnosis not present

## 2019-07-31 DIAGNOSIS — E781 Pure hyperglyceridemia: Secondary | ICD-10-CM | POA: Diagnosis not present

## 2019-07-31 DIAGNOSIS — E785 Hyperlipidemia, unspecified: Secondary | ICD-10-CM

## 2019-07-31 MED ORDER — HYDROCHLOROTHIAZIDE 12.5 MG PO CAPS: 12.5000 mg | ORAL_CAPSULE | Freq: Every day | ORAL | 1 refills | Status: DC

## 2019-07-31 MED ORDER — ROSUVASTATIN CALCIUM 5 MG PO TABS: 5.0000 mg | ORAL_TABLET | Freq: Every day | ORAL | 1 refills | Status: DC

## 2019-07-31 NOTE — Patient Instructions (Signed)
High Triglycerides Eating Plan Triglycerides are a type of fat in the blood. High levels of triglycerides can increase your risk of heart disease and stroke. If your triglyceride levels are high, choosing the right foods can help lower your triglycerides and keep your heart healthy. Work with your health care provider or a diet and nutrition specialist (dietitian) to develop an eating plan that is right for you. What are tips for following this plan? General guidelines   Lose weight, if you are overweight. For most people, losing 5-10 lbs (2-5 kg) helps lower triglyceride levels. A weight-loss plan may include. ? 30 minutes of exercise at least 5 days a week. ? Reducing the amount of calories, sugar, and fat you eat.  Eat a wide variety of fresh fruits, vegetables, and whole grains. These foods are high in fiber.  Eat foods that contain healthy fats, such as fatty fish, nuts, seeds, and olive oil.  Avoid foods that are high in added sugar, added salt (sodium), saturated fat, and trans fat.  Avoid low-fiber, refined carbohydrates such as white bread, crackers, noodles, and white rice.  Avoid foods with partially hydrogenated oils (trans fats), such as fried foods or stick margarine.  Limit alcohol intake to no more than 1 drink a day for nonpregnant women and 2 drinks a day for men. One drink equals 12 oz of beer, 5 oz of wine, or 1 oz of hard liquor. Your health care provider may recommend that you drink less depending on your overall health. Reading food labels  Check food labels for the amount of saturated fat. Choose foods with no or very little saturated fat.  Check food labels for the amount of trans fat. Choose foods with no trans fat.  Check food labels for the amount of cholesterol. Choose foods low in cholesterol. Ask your dietitian how much cholesterol you should have each day.  Check food labels for the amount of sodium. Choose foods with less than 140 milligrams (mg) per  serving. Shopping  Buy dairy products labeled as nonfat (skim) or low-fat (1%).  Avoid buying processed or prepackaged foods. These are often high in added sugar, sodium, and fat. Cooking  Choose healthy fats when cooking, such as olive oil or canola oil.  Cook foods using lower fat methods, such as baking, broiling, boiling, or grilling.  Make your own sauces, dressings, and marinades when possible, instead of buying them. Store-bought sauces, dressings, and marinades are often high in sodium and sugar. Meal planning  Eat more home-cooked food and less restaurant, buffet, and fast food.  Eat fatty fish at least 2 times each week. Examples of fatty fish include salmon, trout, mackerel, tuna, and herring.  If you eat whole eggs, do not eat more than 3 egg yolks per week. What foods are recommended? The items listed may not be a complete list. Talk with your dietitian about what dietary choices are best for you. Grains Whole wheat or whole grain breads, crackers, cereals, and pasta. Unsweetened oatmeal. Bulgur. Barley. Quinoa. Brown rice. Whole wheat flour tortillas. Vegetables Fresh or frozen vegetables. Low-sodium canned vegetables. Fruits All fresh, canned (in natural juice), or frozen fruits. Meats and other protein foods Skinless chicken or turkey. Ground chicken or turkey. Lean cuts of pork, trimmed of fat. Fish and seafood, especially salmon, trout, and herring. Egg whites. Dried beans, peas, or lentils. Unsalted nuts or seeds. Unsalted canned beans. Natural peanut or almond butter. Dairy Low-fat dairy products. Skim or low-fat (1%) milk. Reduced fat (  2%) and low-sodium cheese. Low-fat ricotta cheese. Low-fat cottage cheese. Plain, low-fat yogurt. Fats and oils Tub margarine without trans fats. Light or reduced-fat mayonnaise. Light or reduced-fat salad dressings. Avocado. Safflower, olive, sunflower, soybean, and canola oils. What foods are not recommended? The items listed  may not be a complete list. Talk with your dietitian about what dietary choices are best for you. Grains White bread. White (regular) pasta. White rice. Cornbread. Bagels. Pastries. Crackers that contain trans fat. Vegetables Creamed or fried vegetables. Vegetables in a cheese sauce. Fruits Sweetened dried fruit. Canned fruit in syrup. Fruit juice. Meats and other protein foods Fatty cuts of meat. Ribs. Chicken wings. Bacon. Sausage. Bologna. Salami. Chitterlings. Fatback. Hot dogs. Bratwurst. Packaged lunch meats. Dairy Whole or reduced-fat (2%) milk. Half-and-half. Cream cheese. Full-fat or sweetened yogurt. Full-fat cheese. Nondairy creamers. Whipped toppings. Processed cheese or cheese spreads. Cheese curds. Beverages Alcohol. Sweetened drinks, such as soda, lemonade, fruit drinks, or punches. Fats and oils Butter. Stick margarine. Lard. Shortening. Ghee. Bacon fat. Tropical oils, such as coconut, palm kernel, or palm oils. Sweets and desserts Corn syrup. Sugars. Honey. Molasses. Candy. Jam and jelly. Syrup. Sweetened cereals. Cookies. Pies. Cakes. Donuts. Muffins. Ice cream. Condiments Store-bought sauces, dressings, and marinades that are high in sugar, such as ketchup and barbecue sauce. Summary  High levels of triglycerides can increase the risk of heart disease and stroke. Choosing the right foods can help lower your triglycerides.  Eat plenty of fresh fruits, vegetables, and whole grains. Choose low-fat dairy and lean meats. Eat fatty fish at least twice a week.  Avoid processed and prepackaged foods with added sugar, sodium, saturated fat, and trans fat.  If you need suggestions or have questions about what types of food are good for you, talk with your health care provider or a dietitian. This information is not intended to replace advice given to you by your health care provider. Make sure you discuss any questions you have with your health care provider. Document Revised:  01/19/2017 Document Reviewed: 04/11/2016 Elsevier Patient Education  2020 Elsevier Inc.  

## 2019-07-31 NOTE — Progress Notes (Signed)
Established Patient Office Visit  Subjective:  Patient ID: Dana Hodge, female    DOB: 11-Jul-1950  Age: 69 y.o. MRN: [id number]  CC:  Chief Complaint  Patient presents with  . Hypertension  . Hyperlipidemia    HPI Charnel A Shimada presents for follow up on hypertension and hyperlipidemia. She's doing well and has no acute concerns except for discussing statin therapy.  HTN: Pt denies chest pain, palpitations, dizziness or lower extremity swelling. Taking medication as directed without side effects. Pt follows a low salt diet.  HLD: Pt taking medication as directed and would like to discuss changing statin therapy due to right shoulder pain/myagias.    Past Medical History:  Diagnosis Date  . Chicken pox   . Colon polyp   . Hyperlipidemia   . Hypertension   . UTI (urinary tract infection)     Past Surgical History:  Procedure Laterality Date  . LAPAROSCOPIC APPENDECTOMY N/A 01/15/2016   Procedure: APPENDECTOMY LAPAROSCOPIC. DRAINAGE OF INTRA ABDOMINAL ABSCESSES X 3, LYSIS OF ADHESIONS;  Surgeon: Michael Boston, MD;  Location: [facility];  Service: General;  Laterality: N/A;    Family History  Problem Relation Age of Onset  . Heart disease Mother 79       CAD  . Hypertension Mother   . Hyperlipidemia Mother   . Heart disease Father 5       MI  . COPD Father   . Cancer Maternal Uncle        colon, pancreatic  . Cancer Maternal Grandfather        colon, pancreatic  . Hypertension Sister   . Alcoholism Paternal Uncle   . Stroke Maternal Grandmother     Social History   Socioeconomic History  . Marital status: Married    Spouse name: Not on file  . Number of children: Not on file  . Years of education: Not on file  . Highest education level: Not on file  Occupational History  . Not on file  Tobacco Use  . Smoking status: Never Smoker  . Smokeless tobacco: Never Used  Vaping Use  . Vaping Use: Never used  Substance and Sexual Activity  . Alcohol use: Yes     Comment: a glass of wine with dinner   . Drug use: No  . Sexual activity: Yes    Birth control/protection: None  Other Topics Concern  . Not on file  Social History Narrative   Work or School: works as Water quality scientist, office work      Home Situation: lives with husband and handicapped adult son      Spiritual Beliefs: Christian      Lifestyle: going to the gym 3-4 days per week, working on diet - portion sizes are an issue            Social Determinants of Radio broadcast assistant Strain:   . Difficulty of Paying Living Expenses:   Food Insecurity:   . Worried About Charity fundraiser in the Last Year:   . Arboriculturist in the Last Year:   Transportation Needs:   . Film/video editor (Medical):   Marland Kitchen Lack of Transportation (Non-Medical):   Physical Activity:   . Days of Exercise per Week:   . Minutes of Exercise per Session:   Stress:   . Feeling of Stress :   Social Connections:   . Frequency of Communication with Friends and Family:   . Frequency of Social Gatherings  with Friends and Family:   . Attends Religious Services:   . Active Member of Clubs or Organizations:   . Attends Archivist Meetings:   Marland Kitchen Marital Status:   Intimate Partner Violence:   . Fear of Current or Ex-Partner:   . Emotionally Abused:   Marland Kitchen Physically Abused:   . Sexually Abused:     Outpatient Medications Prior to Visit  Medication Sig Dispense Refill  . b complex vitamins tablet Take 1 tablet by mouth daily.    Marland Kitchen losartan (COZAAR) 50 MG tablet Take 1 tablet (50 mg total) by mouth daily. 90 tablet 0  . Vitamin D, Ergocalciferol, (DRISDOL) 1.25 MG (50000 UNIT) CAPS capsule Take 1 capsule (50,000 Units total) by mouth every 7 (seven) days. 12 capsule 1  . atorvastatin (LIPITOR) 20 MG tablet TAKE 1 TABLET BY MOUTH EVERY DAY AT BEDTIME 90 tablet 1  . hydrochlorothiazide (MICROZIDE) 12.5 MG capsule Take 1 capsule (12.5 mg total) by mouth daily. 90 capsule 1  . vitamin B-12  (CYANOCOBALAMIN) 500 MCG tablet Take 500 mcg by mouth daily.  (Patient not taking: Reported on 07/31/2019)     No facility-administered medications prior to visit.    No Known Allergies  ROS Review of Systems    Review of Systems: General:   Denies fever, chills, unexplained weight loss.  Optho/Auditory:   Denies visual changes, blurred vision/LOV Respiratory:   Denies SOB, DOE more than baseline levels.  Cardiovascular:   Denies chest pain, palpitations, new onset peripheral edema  Gastrointestinal:   Denies nausea, vomiting, diarrhea.  Genitourinary: Denies dysuria, freq/ urgency, flank pain or discharge from genitals.  Endocrine:     Denies hot or cold intolerance, polyuria, polydipsia. Musculoskeletal:   Denies unexplained joint swelling, gait problems.  Skin:  Denies rash, suspicious lesions Neurological:     Denies dizziness, unexplained weakness, numbness  Psychiatric/Behavioral:   Denies mood changes, suicidal or homicidal ideations, hallucinations   Objective:    Physical Exam General:  Well Developed, well nourished, appropriate for stated age.  Neuro:  Alert and oriented,  extra-ocular muscles intact  HEENT:  Normocephalic, atraumatic, neck supple Skin:  no gross rash, warm, pink. Cardiac:  RRR, S1 S2 Respiratory:  ECTA B/L and A/P, Not using accessory muscles, speaking in full sentences- unlabored. Vascular:  Ext warm, no cyanosis apprec.; cap RF less 2 sec. Psych:  No HI/SI, judgement and insight good, Euthymic mood. Full Affect.   BP 103/67   Pulse 82   Temp 98.3 F (36.8 C) (Oral)   Ht 5\' 5"  (1.651 m)   Wt 173 lb 6.4 oz (78.7 kg)   SpO2 96%   BMI 28.86 kg/m  Wt Readings from Last 3 Encounters:  07/31/19 173 lb 6.4 oz (78.7 kg)  04/02/19 177 lb 12.8 oz (80.6 kg)  07/31/18 168 lb (76.2 kg)     Health Maintenance Due  Topic Date Due  . PNA vac Low Risk Adult (1 of 2 - PCV13) Never done    There are no preventive care reminders to display for this  patient.  Lab Results  Component Value Date   TSH 2.200 04/02/2019   Lab Results  Component Value Date   WBC 7.9 04/02/2019   HGB 14.7 04/02/2019   HCT 41.8 04/02/2019   MCV 89 04/02/2019   PLT 275 04/02/2019   Lab Results  Component Value Date   NA 139 04/02/2019   K 4.5 04/02/2019   CO2 26 04/02/2019   GLUCOSE 95  04/02/2019   BUN 12 04/02/2019   CREATININE 0.96 04/02/2019   BILITOT 0.5 04/02/2019   ALKPHOS 78 04/02/2019   AST 26 04/02/2019   ALT 33 (H) 04/02/2019   PROT 7.1 04/02/2019   ALBUMIN 4.7 04/02/2019   CALCIUM 10.3 04/02/2019   ANIONGAP 9 01/18/2016   GFR 70.86 12/23/2015   Lab Results  Component Value Date   CHOL 180 04/02/2019   Lab Results  Component Value Date   HDL 62 04/02/2019   Lab Results  Component Value Date   LDLCALC 81 04/02/2019   Lab Results  Component Value Date   TRIG 228 (H) 04/02/2019   Lab Results  Component Value Date   CHOLHDL 2.9 04/02/2019   Lab Results  Component Value Date   HGBA1C 5.7 (H) 04/02/2019      Assessment & Plan:   Problem List Items Addressed This Visit      Cardiovascular and Mediastinum   Essential hypertension - Primary   Relevant Medications   rosuvastatin (CRESTOR) 5 MG tablet   hydrochlorothiazide (MICROZIDE) 12.5 MG capsule     Other   Hypertriglyceridemia   Relevant Medications   rosuvastatin (CRESTOR) 5 MG tablet   hydrochlorothiazide (MICROZIDE) 12.5 MG capsule    Other Visit Diagnoses    Hyperlipidemia, unspecified hyperlipidemia type-10-year risk greater than 10%       Relevant Medications   rosuvastatin (CRESTOR) 5 MG tablet   hydrochlorothiazide (MICROZIDE) 12.5 MG capsule     HTN: - BP today is 103/7, at goal. - Continue HCTZ 12.5 mg and Losartan 50 mg - Encourage ambulatory BP and pulse monitoring, notify the office if BP consistently <90/60 or >140/90. - Continue DASH diet. - Encourage to stay as active as possible. - Stay well hydrated, at least 64 fl oz  HLD,  Hypertriglyceridemia: - Last lipid panel: triglycerides elevated - Discontinued Atorvastatin 20 mg and started Rosuvastatin 5 mg. - Follow heart healthy diet. - Follow up in 3 months to reassess medication therapy and recheck lipid panel and hepatic function.   Meds ordered this encounter  Medications  . rosuvastatin (CRESTOR) 5 MG tablet    Sig: Take 1 tablet (5 mg total) by mouth daily.    Dispense:  90 tablet    Refill:  1    Order Specific Question:   Supervising Provider    Answer:   Beatrice Lecher D [[id number]]  . hydrochlorothiazide (MICROZIDE) 12.5 MG capsule    Sig: Take 1 capsule (12.5 mg total) by mouth daily.    Dispense:  90 capsule    Refill:  1    Order Specific Question:   Supervising Provider    Answer:   Beatrice Lecher D [[id number]]    Follow-up: Return in about 3 months (around 10/31/2019) for HLD- statin med changed, recheck lipid.    Lorrene Reid, PA-C

## 2019-09-30 ENCOUNTER — Encounter: Payer: Self-pay | Admitting: Gastroenterology

## 2019-09-30 ENCOUNTER — Ambulatory Visit (AMBULATORY_SURGERY_CENTER): Payer: Self-pay

## 2019-09-30 VITALS — Ht 65.0 in | Wt 165.0 lb

## 2019-09-30 DIAGNOSIS — Z1211 Encounter for screening for malignant neoplasm of colon: Secondary | ICD-10-CM

## 2019-09-30 NOTE — Progress Notes (Signed)
No egg or soy allergy known to patient  No issues with past sedation with any surgeries or procedures no intubation problems in the past  No FH of Malignant Hyperthermia No diet pills per patient No home 02 use per patient  No blood thinners per patient  Pt denies issues with constipation  No A fib or A flutter  EMMI video to pt or via [place] 19 guidelines implemented in PV today with Pt and RN   Virtual Pre-visit  Pt has been vaccinated for Covid.   Due to the COVID-19 pandemic we are asking patients to follow these guidelines. Please only bring one care partner. Please be aware that your care partner may wait in the car in the parking lot or if they feel like they will be too hot to wait in the car, they may wait in the lobby on the 4th floor. All care partners are required to wear a mask the entire time (we do not have any that we can provide them), they need to practice social distancing, and we will do a Covid check for all patient's and care partners when you arrive. Also we will check their temperature and your temperature. If the care partner waits in their car they need to stay in the parking lot the entire time and we will call them on their cell phone when the patient is ready for discharge so they can bring the car to the front of the building. Also all patient's will need to wear a mask into building.

## 2019-10-14 ENCOUNTER — Other Ambulatory Visit: Payer: Self-pay

## 2019-10-14 ENCOUNTER — Encounter: Payer: Self-pay | Admitting: Gastroenterology

## 2019-10-14 ENCOUNTER — Ambulatory Visit (AMBULATORY_SURGERY_CENTER): Payer: Medicare Other | Admitting: Gastroenterology

## 2019-10-14 VITALS — BP 122/78 | HR 65 | Temp 97.3°F | Resp 10 | Ht 65.0 in | Wt 165.0 lb

## 2019-10-14 DIAGNOSIS — D128 Benign neoplasm of rectum: Secondary | ICD-10-CM

## 2019-10-14 DIAGNOSIS — D124 Benign neoplasm of descending colon: Secondary | ICD-10-CM

## 2019-10-14 DIAGNOSIS — D123 Benign neoplasm of transverse colon: Secondary | ICD-10-CM

## 2019-10-14 DIAGNOSIS — Z1211 Encounter for screening for malignant neoplasm of colon: Secondary | ICD-10-CM

## 2019-10-14 MED ORDER — SODIUM CHLORIDE 0.9 % IV SOLN
500.0000 mL | Freq: Once | INTRAVENOUS | Status: DC
Start: 2019-10-14 — End: 2019-10-14

## 2019-10-14 NOTE — Patient Instructions (Signed)

## 2019-10-14 NOTE — Progress Notes (Signed)
Report to PACU, RN, vss, BBS= Clear.  

## 2019-10-14 NOTE — Progress Notes (Signed)
Called to room to assist during endoscopic procedure.  Patient ID and intended procedure confirmed with present staff. Received instructions for my participation in the procedure from the performing physician.  

## 2019-10-14 NOTE — Progress Notes (Signed)
Pt's states no medical or surgical changes since previsit or office visit. VS by NS. 

## 2019-10-14 NOTE — Op Note (Signed)
[facility] Patient Name: Dana Hodge Procedure Date: 10/14/2019 1:16 PM MRN: [id number] Endoscopist: Milus Banister , MD Age: 69 Referring MD:  Date of Birth: 02-22-1950 Gender: Female Account #: [id number] Procedure:                Colonoscopy Indications:              Screening for colorectal malignant neoplasm Medicines:                Monitored Anesthesia Care Procedure:                Pre-Anesthesia Assessment:                           - Prior to the procedure, a History and Physical                            was performed, and patient medications and                            allergies were reviewed. The patient's tolerance of                            previous anesthesia was also reviewed. The risks                            and benefits of the procedure and the sedation                            options and risks were discussed with the patient.                            All questions were answered, and informed consent                            was obtained. Prior Anticoagulants: The patient has                            taken no previous anticoagulant or antiplatelet                            agents. ASA Grade Assessment: II - A patient with                            mild systemic disease. After reviewing the risks                            and benefits, the patient was deemed in                            satisfactory condition to undergo the procedure.                           After obtaining informed consent, the colonoscope  was passed under direct vision. Throughout the                            procedure, the patient's blood pressure, pulse, and                            oxygen saturations were monitored continuously. The                            Colonoscope was introduced through the anus and                            advanced to the the terminal ileum, with                            identification of the  appendiceal orifice and IC                            valve. The colonoscopy was performed without                            difficulty. The patient tolerated the procedure                            well. The quality of the bowel preparation was                            good. The ileocecal valve, appendiceal orifice, and                            rectum were photographed. Scope In: 1:23:48 PM Scope Out: 1:36:12 PM Scope Withdrawal Time: 0 hours 6 minutes 54 seconds  Total Procedure Duration: 0 hours 12 minutes 24 seconds  Findings:                 Three sessile polyps were found in the rectum,                            descending colon and transverse colon. The polyps                            were 3 to 5 mm in size. These polyps were removed                            with a cold snare. Resection and retrieval were                            complete.                           Internal hemorrhoids were found. The hemorrhoids                            were small.  The exam was otherwise without abnormality on                            direct and retroflexion views. Complications:            No immediate complications. Estimated blood loss:                            None. Estimated Blood Loss:     Estimated blood loss: none. Impression:               - Three 3 to 5 mm polyps in the rectum, in the                            descending colon and in the transverse colon,                            removed with a cold snare. Resected and retrieved.                           - Internal hemorrhoids.                           - The examination was otherwise normal on direct                            and retroflexion views. Recommendation:           - Patient has a contact number available for                            emergencies. The signs and symptoms of potential                            delayed complications were discussed with the                             patient. Return to normal activities tomorrow.                            Written discharge instructions were provided to the                            patient.                           - Resume previous diet.                           - Continue present medications.                           - Await pathology results. Milus Banister, MD 10/14/2019 1:39:46 PM This report has been signed electronically.

## 2019-10-15 ENCOUNTER — Other Ambulatory Visit: Payer: Self-pay | Admitting: Physician Assistant

## 2019-10-15 DIAGNOSIS — E559 Vitamin D deficiency, unspecified: Secondary | ICD-10-CM

## 2019-10-15 DIAGNOSIS — E785 Hyperlipidemia, unspecified: Secondary | ICD-10-CM

## 2019-10-15 DIAGNOSIS — I10 Essential (primary) hypertension: Secondary | ICD-10-CM

## 2019-10-15 DIAGNOSIS — E2839 Other primary ovarian failure: Secondary | ICD-10-CM

## 2019-10-15 DIAGNOSIS — Z780 Asymptomatic menopausal state: Secondary | ICD-10-CM

## 2019-10-15 DIAGNOSIS — Z1231 Encounter for screening mammogram for malignant neoplasm of breast: Secondary | ICD-10-CM

## 2019-10-15 DIAGNOSIS — Z1211 Encounter for screening for malignant neoplasm of colon: Secondary | ICD-10-CM

## 2019-10-15 DIAGNOSIS — E781 Pure hyperglyceridemia: Secondary | ICD-10-CM

## 2019-10-16 ENCOUNTER — Telehealth: Payer: Self-pay | Admitting: *Deleted

## 2019-10-16 NOTE — Telephone Encounter (Signed)
  Follow up Call-  Call back number 10/14/2019  Post procedure Call Back phone  # [phone]  Permission to leave phone message Yes  Some recent data might be hidden     Patient questions:  Do you have a fever, pain , or abdominal swelling? No. Pain Score  0 *  Have you tolerated food without any problems? Yes.    Have you been able to return to your normal activities? Yes.    Do you have any questions about your discharge instructions: Diet   No. Medications  No. Follow up visit  No.  Do you have questions or concerns about your Care? No.  Actions: * If pain score is 4 or above: No action needed, pain <4.  1. Have you developed a fever since your procedure? no  2.   Have you had an respiratory symptoms (SOB or cough) since your procedure? no  3.   Have you tested positive for COVID 19 since your procedure no  4.   Have you had any family members/close contacts diagnosed with the COVID 19 since your procedure?  no   If yes to any of these questions please route to Joylene John, RN and Joella Prince, RN

## 2019-10-20 ENCOUNTER — Encounter: Payer: Self-pay | Admitting: Gastroenterology

## 2019-10-31 ENCOUNTER — Ambulatory Visit: Payer: Medicare Other | Admitting: Physician Assistant

## 2019-11-26 ENCOUNTER — Other Ambulatory Visit: Payer: Self-pay

## 2019-11-26 ENCOUNTER — Ambulatory Visit (INDEPENDENT_AMBULATORY_CARE_PROVIDER_SITE_OTHER): Payer: Medicare Other | Admitting: Physician Assistant

## 2019-11-26 ENCOUNTER — Encounter: Payer: Self-pay | Admitting: Physician Assistant

## 2019-11-26 VITALS — BP 103/69 | HR 88 | Ht 65.0 in | Wt 176.2 lb

## 2019-11-26 DIAGNOSIS — R7303 Prediabetes: Secondary | ICD-10-CM | POA: Diagnosis not present

## 2019-11-26 DIAGNOSIS — E781 Pure hyperglyceridemia: Secondary | ICD-10-CM

## 2019-11-26 DIAGNOSIS — I10 Essential (primary) hypertension: Secondary | ICD-10-CM | POA: Diagnosis not present

## 2019-11-26 DIAGNOSIS — E663 Overweight: Secondary | ICD-10-CM

## 2019-11-26 DIAGNOSIS — E782 Mixed hyperlipidemia: Secondary | ICD-10-CM | POA: Diagnosis not present

## 2019-11-26 NOTE — Patient Instructions (Signed)

## 2019-11-27 LAB — LIPID PANEL
Chol/HDL Ratio: 6.1 ratio — ABNORMAL HIGH (ref 0.0–4.4)
Cholesterol, Total: 310 mg/dL — ABNORMAL HIGH (ref 100–199)
HDL: 51 mg/dL (ref >39)
LDL Chol Calc (NIH): 208 mg/dL — ABNORMAL HIGH (ref 0–99)
Triglycerides: 258 mg/dL — ABNORMAL HIGH (ref 0–149)
VLDL Cholesterol Cal: 51 mg/dL — ABNORMAL HIGH (ref 5–40)

## 2019-11-27 LAB — HEPATIC FUNCTION PANEL
ALT: 22 IU/L (ref 0–32)
AST: 19 IU/L (ref 0–40)
Albumin: 4.9 g/dL — ABNORMAL HIGH (ref 3.8–4.8)
Alkaline Phosphatase: 76 IU/L (ref 44–121)
Bilirubin Total: 0.4 mg/dL (ref 0.0–1.2)
Bilirubin, Direct: <0.1 mg/dL (ref 0.00–0.40)
Total Protein: 7.3 g/dL (ref 6.0–8.5)

## 2019-11-28 NOTE — Progress Notes (Signed)
Established Patient Office Visit  Subjective:  Patient ID: Dana Hodge, female    DOB: [birth year]  Age: 69 y.o. MRN: [id number]  CC:  Chief Complaint  Patient presents with  . Hypertension  . Hyperlipidemia  . Prediabetes    HPI Dana Hodge presents for chronic follow-up on hypertension, hyperlipidemia and prediabetes.  HTN: Pt denies chest pain, palpitations, dizziness or lower extremity swelling. Taking medication as directed without side effects.  Does not check blood pressure at home.  Has not felt like her blood pressure has been elevated.    HLD: Pt taking medication as directed without issues. Reports she has not noticed a difference in her shoulder pain/discomfort.  Denies new myalgias or arthralgias.  Prediabetes: Asymptomatic.  Weight: States she would like to lose weight but finds it very challenging to do so on her own. In the past she was able to lose weight with a coworker. Having a partner helps her stay motivated and on track.  Past Medical History:  Diagnosis Date  . Chicken pox   . Colon polyp   . Hyperlipidemia   . Hypertension   . UTI (urinary tract infection)     Past Surgical History:  Procedure Laterality Date  . APPENDECTOMY    . COLONOSCOPY  09/03/2009   hyperplastic polyps  . LAPAROSCOPIC APPENDECTOMY N/A 01/15/2016   Procedure: APPENDECTOMY LAPAROSCOPIC. DRAINAGE OF INTRA ABDOMINAL ABSCESSES X 3, LYSIS OF ADHESIONS;  Surgeon: Michael Boston, MD;  Location: [facility];  Service: General;  Laterality: N/A;    Family History  Problem Relation Age of Onset  . Heart disease Mother 68       CAD  . Hypertension Mother   . Hyperlipidemia Mother   . Heart disease Father 23       MI  . COPD Father   . Cancer Maternal Uncle        colon, pancreatic  . Cancer Maternal Grandfather        colon, pancreatic  . Hypertension Sister   . Alcoholism Paternal Uncle   . Stroke Maternal Grandmother   . Colon cancer Neg Hx   . Colon polyps Neg Hx   .  Esophageal cancer Neg Hx   . Rectal cancer Neg Hx   . Stomach cancer Neg Hx     Social History   Socioeconomic History  . Marital status: Married    Spouse name: Not on file  . Number of children: Not on file  . Years of education: Not on file  . Highest education level: Not on file  Occupational History  . Not on file  Tobacco Use  . Smoking status: Never Smoker  . Smokeless tobacco: Never Used  Vaping Use  . Vaping Use: Never used  Substance and Sexual Activity  . Alcohol use: Yes    Comment: a glass of wine with dinner   . Drug use: No  . Sexual activity: Yes    Birth control/protection: None  Other Topics Concern  . Not on file  Social History Narrative   Work or School: works as Water quality scientist, office work      Home Situation: lives with husband and handicapped adult son      Spiritual Beliefs: Christian      Lifestyle: going to the gym 3-4 days per week, working on diet - portion sizes are an issue            Social Determinants of Radio broadcast assistant Strain:   .  Difficulty of Paying Living Expenses: Not on file  Food Insecurity:   . Worried About Charity fundraiser in the Last Year: Not on file  . Ran Out of Food in the Last Year: Not on file  Transportation Needs:   . Lack of Transportation (Medical): Not on file  . Lack of Transportation (Non-Medical): Not on file  Physical Activity:   . Days of Exercise per Week: Not on file  . Minutes of Exercise per Session: Not on file  Stress:   . Feeling of Stress : Not on file  Social Connections:   . Frequency of Communication with Friends and Family: Not on file  . Frequency of Social Gatherings with Friends and Family: Not on file  . Attends Religious Services: Not on file  . Active Member of Clubs or Organizations: Not on file  . Attends Archivist Meetings: Not on file  . Marital Status: Not on file  Intimate Partner Violence:   . Fear of Current or Ex-Partner: Not on file  .  Emotionally Abused: Not on file  . Physically Abused: Not on file  . Sexually Abused: Not on file    Outpatient Medications Prior to Visit  Medication Sig Dispense Refill  . co-enzyme Q-10 30 MG capsule Take 200 mg by mouth daily.    . hydrochlorothiazide (MICROZIDE) 12.5 MG capsule Take 1 capsule (12.5 mg total) by mouth daily. 90 capsule 1  . losartan (COZAAR) 50 MG tablet Take 1 tablet by mouth once daily 90 tablet 0  . rosuvastatin (CRESTOR) 5 MG tablet Take 1 tablet (5 mg total) by mouth daily. 90 tablet 1  . vitamin B-12 (CYANOCOBALAMIN) 500 MCG tablet Take 500 mcg by mouth daily.     . Vitamin D, Ergocalciferol, (DRISDOL) 1.25 MG (50000 UNIT) CAPS capsule Take 1 capsule (50,000 Units total) by mouth every 7 (seven) days. 12 capsule 1  . b complex vitamins tablet Take 1 tablet by mouth daily. (Patient not taking: Reported on 11/26/2019)     No facility-administered medications prior to visit.    No Known Allergies  ROS Review of Systems A fourteen system review of systems was performed and found to be positive as per HPI.     Objective:    Physical Exam General:  Well Developed, well nourished, in no acute distress Neuro:  Alert and oriented,  extra-ocular muscles intact  HEENT:  Normocephalic, atraumatic, neck supple Skin:  no gross rash, warm. Cardiac:  RRR, S1 S2 Respiratory:  ECTA B/L, Not using accessory muscles, speaking in full sentences- unlabored. Vascular:  Ext warm, no cyanosis apprec. Psych:  No HI/SI, judgement and insight good, Euthymic mood. Full Affect.    BP 103/69   Pulse 88   Ht 5\' 5"  (1.651 m)   Wt 176 lb 3.2 oz (79.9 kg)   SpO2 95%   BMI 29.32 kg/m  Wt Readings from Last 3 Encounters:  11/26/19 176 lb 3.2 oz (79.9 kg)  10/14/19 165 lb (74.8 kg)  09/30/19 165 lb (74.8 kg)     Health Maintenance Due  Topic Date Due  . PNA vac Low Risk Adult (1 of 2 - PCV13) Never done  . INFLUENZA VACCINE  09/21/2019    There are no preventive care  reminders to display for this patient.  Lab Results  Component Value Date   TSH 2.200 04/02/2019   Lab Results  Component Value Date   WBC 7.9 04/02/2019   HGB 14.7 04/02/2019  HCT 41.8 04/02/2019   MCV 89 04/02/2019   PLT 275 04/02/2019   Lab Results  Component Value Date   NA 139 04/02/2019   K 4.5 04/02/2019   CO2 26 04/02/2019   GLUCOSE 95 04/02/2019   BUN 12 04/02/2019   CREATININE 0.96 04/02/2019   BILITOT 0.4 11/26/2019   ALKPHOS 76 11/26/2019   AST 19 11/26/2019   ALT 22 11/26/2019   PROT 7.3 11/26/2019   ALBUMIN 4.9 (H) 11/26/2019   CALCIUM 10.3 04/02/2019   ANIONGAP 9 01/18/2016   GFR 70.86 12/23/2015   Lab Results  Component Value Date   CHOL 310 (H) 11/26/2019   Lab Results  Component Value Date   HDL 51 11/26/2019   Lab Results  Component Value Date   LDLCALC 208 (H) 11/26/2019   Lab Results  Component Value Date   TRIG 258 (H) 11/26/2019   Lab Results  Component Value Date   CHOLHDL 6.1 (H) 11/26/2019   Lab Results  Component Value Date   HGBA1C 5.7 (H) 04/02/2019      Assessment & Plan:   Problem List Items Addressed This Visit      Cardiovascular and Mediastinum   Essential hypertension - Primary   Relevant Orders   Hepatic function panel (Completed)   Lipid panel (Completed)     Other   Mixed hyperlipidemia   Relevant Orders   Hepatic function panel (Completed)   Lipid panel (Completed)   Hypertriglyceridemia   Relevant Orders   Hepatic function panel (Completed)   Lipid panel (Completed)   Overweight (BMI 25.0-29.9)     Essential hypertension: -BP stable -Continue current medication regimen. -Follow low-sodium diet. -Will continue to monitor.  Mixed hyperlipidemia, Hypertriglyceridemia: -Last lipid panel triglycerides elevated  -Follow a heart healthy diet. -Continue current medication regimen. Discussed with patient statin less likely contributing to shoulder pain and recommend further evaluation if symptoms  worsen or fail to improve. -Rechecking lipid panel and hepatic function today.  Overweight (BMI 25.0-29.9): -Discussed with patient various management options including medications and side effects, Weight Watchers program and other weight loss programs/apps. Patient prefers lifestyle modifications versus medication.   Prediabetes: -Last A1c stable -Recommend to monitor carbohydrates and glucose. -Increase physical activity such as walking for 15 minutes daily. -Will continue to monitor.   No orders of the defined types were placed in this encounter.   Follow-up: Return in about 6 months (around 05/26/2020) for [facility] with FBW few days before. .   Note:  This note was prepared with assistance of Dragon voice recognition software. Occasional wrong-word or sound-a-like substitutions may have occurred due to the inherent limitations of voice recognition software.   Lorrene Reid, PA-C

## 2019-12-16 ENCOUNTER — Other Ambulatory Visit: Payer: Self-pay | Admitting: Physician Assistant

## 2019-12-16 DIAGNOSIS — E785 Hyperlipidemia, unspecified: Secondary | ICD-10-CM

## 2019-12-16 DIAGNOSIS — E781 Pure hyperglyceridemia: Secondary | ICD-10-CM

## 2020-01-01 ENCOUNTER — Other Ambulatory Visit: Payer: Self-pay | Admitting: Physician Assistant

## 2020-01-01 DIAGNOSIS — E785 Hyperlipidemia, unspecified: Secondary | ICD-10-CM

## 2020-01-01 DIAGNOSIS — E781 Pure hyperglyceridemia: Secondary | ICD-10-CM

## 2020-01-04 ENCOUNTER — Other Ambulatory Visit: Payer: Self-pay | Admitting: Physician Assistant

## 2020-01-04 DIAGNOSIS — Z780 Asymptomatic menopausal state: Secondary | ICD-10-CM

## 2020-01-04 DIAGNOSIS — E785 Hyperlipidemia, unspecified: Secondary | ICD-10-CM

## 2020-01-04 DIAGNOSIS — E781 Pure hyperglyceridemia: Secondary | ICD-10-CM

## 2020-01-04 DIAGNOSIS — Z1211 Encounter for screening for malignant neoplasm of colon: Secondary | ICD-10-CM

## 2020-01-04 DIAGNOSIS — E559 Vitamin D deficiency, unspecified: Secondary | ICD-10-CM

## 2020-01-04 DIAGNOSIS — I10 Essential (primary) hypertension: Secondary | ICD-10-CM

## 2020-01-04 DIAGNOSIS — E2839 Other primary ovarian failure: Secondary | ICD-10-CM

## 2020-01-04 DIAGNOSIS — Z1231 Encounter for screening mammogram for malignant neoplasm of breast: Secondary | ICD-10-CM

## 2020-01-05 ENCOUNTER — Other Ambulatory Visit: Payer: Self-pay | Admitting: Physician Assistant

## 2020-01-05 DIAGNOSIS — I10 Essential (primary) hypertension: Secondary | ICD-10-CM

## 2020-01-28 ENCOUNTER — Telehealth: Payer: Self-pay | Admitting: Physician Assistant

## 2020-01-28 DIAGNOSIS — E559 Vitamin D deficiency, unspecified: Secondary | ICD-10-CM

## 2020-01-28 DIAGNOSIS — E781 Pure hyperglyceridemia: Secondary | ICD-10-CM

## 2020-01-28 DIAGNOSIS — Z1211 Encounter for screening for malignant neoplasm of colon: Secondary | ICD-10-CM

## 2020-01-28 DIAGNOSIS — E2839 Other primary ovarian failure: Secondary | ICD-10-CM

## 2020-01-28 DIAGNOSIS — Z780 Asymptomatic menopausal state: Secondary | ICD-10-CM

## 2020-01-28 DIAGNOSIS — E785 Hyperlipidemia, unspecified: Secondary | ICD-10-CM

## 2020-01-28 DIAGNOSIS — Z1231 Encounter for screening mammogram for malignant neoplasm of breast: Secondary | ICD-10-CM

## 2020-01-28 DIAGNOSIS — I10 Essential (primary) hypertension: Secondary | ICD-10-CM

## 2020-01-28 MED ORDER — LOSARTAN POTASSIUM 50 MG PO TABS: 50.0000 mg | ORAL_TABLET | Freq: Every day | ORAL | 0 refills | Status: DC

## 2020-01-28 NOTE — Telephone Encounter (Signed)
Medication refill sent to pharmacy. AS, CMA ?

## 2020-01-28 NOTE — Addendum Note (Signed)
Addended by: Mickel Crow on: 01/28/2020 01:30 PM   Modules accepted: Orders

## 2020-01-28 NOTE — Telephone Encounter (Signed)
Patient needs a refill on losartan. She would like it sent to [facility] on [address]. Thanks

## 2020-03-14 ENCOUNTER — Other Ambulatory Visit: Payer: Self-pay | Admitting: Physician Assistant

## 2020-03-14 DIAGNOSIS — E785 Hyperlipidemia, unspecified: Secondary | ICD-10-CM

## 2020-03-17 ENCOUNTER — Other Ambulatory Visit: Payer: Self-pay

## 2020-03-30 ENCOUNTER — Other Ambulatory Visit: Payer: Self-pay | Admitting: Physician Assistant

## 2020-03-30 DIAGNOSIS — I10 Essential (primary) hypertension: Secondary | ICD-10-CM

## 2020-04-07 ENCOUNTER — Telehealth: Payer: Self-pay | Admitting: Physician Assistant

## 2020-04-07 ENCOUNTER — Other Ambulatory Visit: Payer: Medicare Other

## 2020-04-07 ENCOUNTER — Other Ambulatory Visit: Payer: Self-pay

## 2020-04-07 DIAGNOSIS — E785 Hyperlipidemia, unspecified: Secondary | ICD-10-CM

## 2020-04-07 NOTE — Telephone Encounter (Signed)
Please call with today's lab results- would like to discuss.

## 2020-04-08 LAB — LIPID PANEL
Chol/HDL Ratio: 3.2 ratio (ref 0.0–4.4)
Cholesterol, Total: 166 mg/dL (ref 100–199)
HDL: 52 mg/dL (ref >39)
LDL Chol Calc (NIH): 84 mg/dL (ref 0–99)
Triglycerides: 175 mg/dL — ABNORMAL HIGH (ref 0–149)
VLDL Cholesterol Cal: 30 mg/dL (ref 5–40)

## 2020-04-09 ENCOUNTER — Other Ambulatory Visit: Payer: Self-pay | Admitting: Physician Assistant

## 2020-04-09 DIAGNOSIS — E785 Hyperlipidemia, unspecified: Secondary | ICD-10-CM

## 2020-04-09 DIAGNOSIS — E559 Vitamin D deficiency, unspecified: Secondary | ICD-10-CM

## 2020-04-09 DIAGNOSIS — I10 Essential (primary) hypertension: Secondary | ICD-10-CM

## 2020-04-09 DIAGNOSIS — E781 Pure hyperglyceridemia: Secondary | ICD-10-CM

## 2020-04-09 DIAGNOSIS — Z1231 Encounter for screening mammogram for malignant neoplasm of breast: Secondary | ICD-10-CM

## 2020-04-09 DIAGNOSIS — Z780 Asymptomatic menopausal state: Secondary | ICD-10-CM

## 2020-04-09 DIAGNOSIS — Z1211 Encounter for screening for malignant neoplasm of colon: Secondary | ICD-10-CM

## 2020-04-09 DIAGNOSIS — E2839 Other primary ovarian failure: Secondary | ICD-10-CM

## 2020-04-09 MED ORDER — LOSARTAN POTASSIUM 50 MG PO TABS: 50.0000 mg | ORAL_TABLET | Freq: Every day | ORAL | 0 refills | Status: DC

## 2020-04-09 MED ORDER — ROSUVASTATIN CALCIUM 5 MG PO TABS: 5.0000 mg | ORAL_TABLET | Freq: Every day | ORAL | 0 refills | Status: DC

## 2020-04-09 MED ORDER — VITAMIN D (ERGOCALCIFEROL) 1.25 MG (50000 UNIT) PO CAPS: 50000.0000 [IU] | ORAL_CAPSULE | ORAL | 0 refills | Status: DC

## 2020-04-26 ENCOUNTER — Other Ambulatory Visit: Payer: Self-pay | Admitting: Physician Assistant

## 2020-04-26 DIAGNOSIS — Z780 Asymptomatic menopausal state: Secondary | ICD-10-CM

## 2020-04-26 DIAGNOSIS — E2839 Other primary ovarian failure: Secondary | ICD-10-CM

## 2020-04-26 DIAGNOSIS — E781 Pure hyperglyceridemia: Secondary | ICD-10-CM

## 2020-04-26 DIAGNOSIS — I10 Essential (primary) hypertension: Secondary | ICD-10-CM

## 2020-04-26 DIAGNOSIS — E559 Vitamin D deficiency, unspecified: Secondary | ICD-10-CM

## 2020-04-26 DIAGNOSIS — E785 Hyperlipidemia, unspecified: Secondary | ICD-10-CM

## 2020-04-26 DIAGNOSIS — Z1231 Encounter for screening mammogram for malignant neoplasm of breast: Secondary | ICD-10-CM

## 2020-04-26 DIAGNOSIS — Z1211 Encounter for screening for malignant neoplasm of colon: Secondary | ICD-10-CM

## 2020-05-10 ENCOUNTER — Telehealth: Payer: Self-pay | Admitting: Physician Assistant

## 2020-05-10 DIAGNOSIS — I10 Essential (primary) hypertension: Secondary | ICD-10-CM

## 2020-05-10 MED ORDER — HYDROCHLOROTHIAZIDE 12.5 MG PO CAPS: 12.5000 mg | ORAL_CAPSULE | Freq: Every day | ORAL | 0 refills | Status: DC

## 2020-05-10 NOTE — Telephone Encounter (Signed)
Rx sent to requested pharmacy. AS, CMA 

## 2020-05-10 NOTE — Telephone Encounter (Signed)
Patient would like her hydrochlorothiazide sent to [facility] on [address] if possible, thanks.

## 2020-05-10 NOTE — Addendum Note (Signed)
Addended by: Mickel Crow on: 05/10/2020 10:46 AM   Modules accepted: Orders

## 2020-06-02 ENCOUNTER — Ambulatory Visit (INDEPENDENT_AMBULATORY_CARE_PROVIDER_SITE_OTHER): Payer: Medicare Other | Admitting: Physician Assistant

## 2020-06-02 ENCOUNTER — Encounter: Payer: Self-pay | Admitting: Physician Assistant

## 2020-06-02 VITALS — BP 127/80 | HR 75 | Ht 65.0 in | Wt 167.0 lb

## 2020-06-02 DIAGNOSIS — Z1231 Encounter for screening mammogram for malignant neoplasm of breast: Secondary | ICD-10-CM

## 2020-06-02 DIAGNOSIS — Z0000 Encounter for general adult medical examination without abnormal findings: Secondary | ICD-10-CM

## 2020-06-02 NOTE — Progress Notes (Signed)
Virtual Visit via Telephone Note:  I connected with Dana Hodge by telephone and verified that I am speaking with the correct person using two identifiers.    I discussed the limitations, risks, security and privacy concerns for performing an evaluation and management service by telephone and the availability of in person appointments. The staff discussed with patient that there may be a patient responsible charge related to this service. The patient expressed understanding and agreed to proceed.   Location of Patient- Home Location of Provider- Office   Subjective:   Dana Hodge is a 69 y.o. female who presents for Medicare Annual (Subsequent) preventive examination.  Review of Systems    General:   No F/C, wt loss Pulm:   No DIB, SOB, pleuritic chest pain Card:  No CP, palpitations Abd:  No n/v/d or pain Ext:  No inc edema from baseline    Objective:    Today's Vitals   06/02/20 1339  BP: 127/80  Pulse: 75  Weight: 167 lb (75.8 kg)  Height: 5\' 5"  (1.651 m)   Body mass index is 27.79 kg/m.  Advanced Directives 10/14/2019 10/17/2017 01/15/2016 01/13/2016 01/05/2016  Does Patient Have a Medical Advance Directive? No No - No No  Would patient like information on creating a medical advance directive? No - Patient declined Yes (MAU/Ambulatory/Procedural Areas - Information given) No - Patient declined - -    Current Medications (verified) Outpatient Encounter Medications as of 06/02/2020  Medication Sig  . b complex vitamins tablet Take 1 tablet by mouth daily.  . calcium carbonate (TUMS - DOSED IN MG ELEMENTAL CALCIUM) 500 MG chewable tablet Chew 1 tablet by mouth daily.  Marland Kitchen co-enzyme Q-10 30 MG capsule Take 200 mg by mouth daily.  . hydrochlorothiazide (MICROZIDE) 12.5 MG capsule Take 1 capsule (12.5 mg total) by mouth daily.  Marland Kitchen losartan (COZAAR) 50 MG tablet Take 1 tablet by mouth once daily  . rosuvastatin (CRESTOR) 5 MG tablet Take 1 tablet (5 mg total) by mouth daily.   . Vitamin D, Ergocalciferol, (DRISDOL) 1.25 MG (50000 UNIT) CAPS capsule Take 1 capsule (50,000 Units total) by mouth every 7 (seven) days.  Marland Kitchen zinc gluconate 50 MG tablet Take 50 mg by mouth daily.  . vitamin B-12 (CYANOCOBALAMIN) 500 MCG tablet Take 500 mcg by mouth daily.  (Patient not taking: Reported on 06/02/2020)   No facility-administered encounter medications on file as of 06/02/2020.    Allergies (verified) Patient has no known allergies.   History: Past Medical History:  Diagnosis Date  . Chicken pox   . Colon polyp   . Hyperlipidemia   . Hypertension   . UTI (urinary tract infection)    Past Surgical History:  Procedure Laterality Date  . APPENDECTOMY    . COLONOSCOPY  09/03/2009   hyperplastic polyps  . LAPAROSCOPIC APPENDECTOMY N/A 01/15/2016   Procedure: APPENDECTOMY LAPAROSCOPIC. DRAINAGE OF INTRA ABDOMINAL ABSCESSES X 3, LYSIS OF ADHESIONS;  Surgeon: Michael Boston, MD;  Location: [facility];  Service: General;  Laterality: N/A;   Family History  Problem Relation Age of Onset  . Heart disease Mother 52       CAD  . Hypertension Mother   . Hyperlipidemia Mother   . Heart disease Father 46       MI  . COPD Father   . Cancer Maternal Uncle        colon, pancreatic  . Cancer Maternal Grandfather        colon, pancreatic  .  Hypertension Sister   . Alcoholism Paternal Uncle   . Stroke Maternal Grandmother   . Colon cancer Neg Hx   . Colon polyps Neg Hx   . Esophageal cancer Neg Hx   . Rectal cancer Neg Hx   . Stomach cancer Neg Hx    Social History   Socioeconomic History  . Marital status: Married    Spouse name: Not on file  . Number of children: Not on file  . Years of education: Not on file  . Highest education level: Not on file  Occupational History  . Not on file  Tobacco Use  . Smoking status: Never Smoker  . Smokeless tobacco: Never Used  Vaping Use  . Vaping Use: Never used  Substance and Sexual Activity  . Alcohol use: Yes    Comment: a  glass of wine with dinner   . Drug use: No  . Sexual activity: Yes    Birth control/protection: None  Other Topics Concern  . Not on file  Social History Narrative   Work or School: works as Water quality scientist, office work      Home Situation: lives with husband and handicapped adult son      Spiritual Beliefs: Christian      Lifestyle: going to the gym 3-4 days per week, working on diet - portion sizes are an issue            Social Determinants of Radio broadcast assistant Strain: Not on file  Food Insecurity: Not on file  Transportation Needs: Not on file  Physical Activity: Not on file  Stress: Not on file  Social Connections: Not on file    Tobacco Counseling Counseling given: Not Answered   Diabetic? No    Activities of Daily Living In your present state of health, do you have any difficulty performing the following activities: 06/02/2020 11/26/2019  Hearing? Y N  Vision? N N  Difficulty concentrating or making decisions? Tempie Donning  Walking or climbing stairs? N N  Dressing or bathing? N N  Doing errands, shopping? N N  Some recent data might be hidden    Patient Care Team: Lorrene Reid, PA-C as PCP - General  Indicate any recent Medical Services you may have received from other than [facility] providers in the past year (date may be approximate).     Assessment:   This is a routine wellness examination for Dana Hodge.  Hearing/Vision screen No exam data present  Dietary issues and exercise activities discussed: -Recommend to follow a heart healthy diet and monitor carbohydrates and glucose. Continue to stay as active as possible.   Goals   None    Depression Screen PHQ 2/9 Scores 06/02/2020 11/26/2019 07/31/2019 04/02/2019 07/31/2018 07/01/2018 04/02/2018  PHQ - 2 Score 0 0 0 0 0 0 0  PHQ- 9 Score 8 2 4 5 4  0 3    Fall Risk Fall Risk  06/02/2020 11/26/2019 04/02/2019 01/22/2018 10/17/2017  Falls in the past year? 1 0 0 0 No  Number falls in past yr: 0 - 0 - -   Injury with Fall? 0 - 0 - -  Follow up Falls evaluation completed Falls evaluation completed Falls evaluation completed - -    FALL RISK PREVENTION PERTAINING TO THE HOME:  Any stairs in or around the home? Yes  If so, are there any without handrails? Yes  Home free of loose throw rugs in walkways, pet beds, electrical cords, etc? Yes  Adequate lighting in your  home to reduce risk of falls? Yes   ASSISTIVE DEVICES UTILIZED TO PREVENT FALLS:  Life alert? No  Use of a cane, walker or w/c? No  Grab bars in the bathroom? No  Shower chair or bench in shower? Yes  Elevated toilet seat or a handicapped toilet? No   TIMED UP AND GO:  Was the test performed? No .  Length of time to ambulate 10 feet:  sec.   TELEHEALTH  Cognitive Function: wnl   6CIT Screen 06/02/2020  What Year? 0 points  What month? 0 points  What time? 0 points  Count back from 20 0 points  Months in reverse 0 points  Repeat phrase 0 points  Total Score 0    Immunizations Immunization History  Administered Date(s) Administered  . Influenza Whole 01/12/2009, 01/05/2010  . Influenza,inj,Quad PF,6+ Mos 01/08/2015, 12/23/2015, 01/22/2018  . PFIZER(Purple Top)SARS-COV-2 Vaccination 04/14/2019, 05/06/2019  . Td 01/12/2009  . Tdap 01/22/2018  . Zoster 06/05/2014    TDAP status: Up to date  Flu Vaccine status: Up to date  Pneumococcal vaccine status: Due, Education has been provided regarding the importance of this vaccine. Advised may receive this vaccine at local pharmacy or Health Dept. Aware to provide a copy of the vaccination record if obtained from local pharmacy or Health Dept. Verbalized acceptance and understanding.  Covid-19 vaccine status: Completed vaccines  Qualifies for Shingles Vaccine? Yes   Zostavax completed No   Shingrix Completed?: No.    Education has been provided regarding the importance of this vaccine. Patient has been advised to call insurance company to determine out of pocket  expense if they have not yet received this vaccine. Advised may also receive vaccine at local pharmacy or Health Dept. Verbalized acceptance and understanding.  Screening Tests Health Maintenance  Topic Date Due  . PNA vac Low Risk Adult (1 of 2 - PCV13) Never done  . COVID-19 Vaccine (3 - Pfizer risk 4-dose series) 06/03/2019  . INFLUENZA VACCINE  09/20/2020  . MAMMOGRAM  06/19/2021  . COLONOSCOPY (Pts 45-32yrs Insurance coverage will need to be confirmed)  10/14/2026  . TETANUS/TDAP  01/23/2028  . DEXA SCAN  Completed  . Hepatitis C Screening  Completed  . HPV VACCINES  Aged Out    Health Maintenance  Health Maintenance Due  Topic Date Due  . PNA vac Low Risk Adult (1 of 2 - PCV13) Never done  . COVID-19 Vaccine (3 - Pfizer risk 4-dose series) 06/03/2019    Colorectal cancer screening: Type of screening: Colonoscopy. Completed 10/14/2019. Repeat every 7 years  Mammogram status: Completed 06/20/2019. Repeat every year  Dexa: 06/20/2019  Lung Cancer Screening: (Low Dose CT Chest recommended if Age 91-80 years, 30 pack-year currently smoking OR have quit w/in 15years.) does not qualify.   Lung Cancer Screening Referral:   Additional Screening:  Hepatitis C Screening: does qualify; Completed 04/28/2016   Vision Screening: Recommended annual ophthalmology exams for early detection of glaucoma and other disorders of the eye. Is the patient up to date with their annual eye exam?  Yes  Who is the provider or what is the name of the office in which the patient attends annual eye exams? Dr. Chelsea Aus If pt is not established with a provider, would they like to be referred to a provider to establish care? No .   Dental Screening: Recommended annual dental exams for proper oral hygiene  Community Resource Referral / Chronic Care Management: CRR required this visit?  No   CCM  required this visit?  No      Plan:  -BP controlled. -Continue current medication regimen. -Follow up  in 6 months for HTN, HLD and PreDM. Schedule lab visit for FBW and Vit D in 1-6 weeks.  I have personally reviewed and noted the following in the patient's chart:   . Medical and social history . Use of alcohol, tobacco or illicit drugs  . Current medications and supplements . Functional ability and status . Nutritional status . Physical activity . Advanced directives . List of other physicians . Hospitalizations, surgeries, and ER visits in previous 12 months . Vitals . Screenings to include cognitive, depression, and falls . Referrals and appointments  In addition, I have reviewed and discussed with patient certain preventive protocols, quality metrics, and best practice recommendations. A written personalized care plan for preventive services as well as general preventive health recommendations were provided to patient.

## 2020-06-02 NOTE — Patient Instructions (Signed)
Preventive Care 68 Years and Older, Female Preventive care refers to lifestyle choices and visits with your health care provider that can promote health and wellness. This includes:  A yearly physical exam. This is also called an annual wellness visit.  Regular dental and eye exams.  Immunizations.  Screening for certain conditions.  Healthy lifestyle choices, such as: ? Eating a healthy diet. ? Getting regular exercise. ? Not using drugs or products that contain nicotine and tobacco. ? Limiting alcohol use. What can I expect for my preventive care visit? Physical exam Your health care provider will check your:  Height and weight. These may be used to calculate your BMI (body mass index). BMI is a measurement that tells if you are at a healthy weight.  Heart rate and blood pressure.  Body temperature.  Skin for abnormal spots. Counseling Your health care provider may ask you questions about your:  Past medical problems.  Family's medical history.  Alcohol, tobacco, and drug use.  Emotional well-being.  Home life and relationship well-being.  Sexual activity.  Diet, exercise, and sleep habits.  History of falls.  Memory and ability to understand (cognition).  Work and work Statistician.  Pregnancy and menstrual history.  Access to firearms. What immunizations do I need? Vaccines are usually given at various ages, according to a schedule. Your health care provider will recommend vaccines for you based on your age, medical history, and lifestyle or other factors, such as travel or where you work.   What tests do I need? Blood tests  Lipid and cholesterol levels. These may be checked every 5 years, or more often depending on your overall health.  Hepatitis C test.  Hepatitis B test. Screening  Lung cancer screening. You may have this screening every year starting at age 74 if you have a 30-pack-year history of smoking and currently smoke or have quit within  the past 15 years.  Colorectal cancer screening. ? All adults should have this screening starting at age 44 and continuing until age 58. ? Your health care provider may recommend screening at age 2 if you are at increased risk. ? You will have tests every 1-10 years, depending on your results and the type of screening test.  Diabetes screening. ? This is done by checking your blood sugar (glucose) after you have not eaten for a while (fasting). ? You may have this done every 1-3 years.  Mammogram. ? This may be done every 1-2 years. ? Talk with your health care provider about how often you should have regular mammograms.  Abdominal aortic aneurysm (AAA) screening. You may need this if you are a current or former smoker.  BRCA-related cancer screening. This may be done if you have a family history of breast, ovarian, tubal, or peritoneal cancers. Other tests  STD (sexually transmitted disease) testing, if you are at risk.  Bone density scan. This is done to screen for osteoporosis. You may have this done starting at age 104. Talk with your health care provider about your test results, treatment options, and if necessary, the need for more tests. Follow these instructions at home: Eating and drinking  Eat a diet that includes fresh fruits and vegetables, whole grains, lean protein, and low-fat dairy products. Limit your intake of foods with high amounts of sugar, saturated fats, and salt.  Take vitamin and mineral supplements as recommended by your health care provider.  Do not drink alcohol if your health care provider tells you not to drink.  If you drink alcohol: ? Limit how much you have to 0-1 drink a day. ? Be aware of how much alcohol is in your drink. In the [place] one drink equals one 12 oz bottle of beer (355 mL), one 5 oz glass of wine (148 mL), or one 1 oz glass of hard liquor (44 mL).   Lifestyle  Take daily care of your teeth and gums. Brush your teeth every morning  and night with fluoride toothpaste. Floss one time each day.  Stay active. Exercise for at least 30 minutes 5 or more days each week.  Do not use any products that contain nicotine or tobacco, such as cigarettes, e-cigarettes, and chewing tobacco. If you need help quitting, ask your health care provider.  Do not use drugs.  If you are sexually active, practice safe sex. Use a condom or other form of protection in order to prevent STIs (sexually transmitted infections).  Talk with your health care provider about taking a low-dose aspirin or statin.  Find healthy ways to cope with stress, such as: ? Meditation, yoga, or listening to music. ? Journaling. ? Talking to a trusted person. ? Spending time with friends and family. Safety  Always wear your seat belt while driving or riding in a vehicle.  Do not drive: ? If you have been drinking alcohol. Do not ride with someone who has been drinking. ? When you are tired or distracted. ? While texting.  Wear a helmet and other protective equipment during sports activities.  If you have firearms in your house, make sure you follow all gun safety procedures. What's next?  Visit your health care provider once a year for an annual wellness visit.  Ask your health care provider how often you should have your eyes and teeth checked.  Stay up to date on all vaccines. This information is not intended to replace advice given to you by your health care provider. Make sure you discuss any questions you have with your health care provider. Document Revised: 01/28/2020 Document Reviewed: 01/31/2018 Elsevier Patient Education  2021 Elsevier Inc.  

## 2020-07-08 ENCOUNTER — Other Ambulatory Visit: Payer: Self-pay | Admitting: Physician Assistant

## 2020-07-08 DIAGNOSIS — E781 Pure hyperglyceridemia: Secondary | ICD-10-CM

## 2020-07-08 DIAGNOSIS — E785 Hyperlipidemia, unspecified: Secondary | ICD-10-CM

## 2020-08-04 ENCOUNTER — Other Ambulatory Visit: Payer: Self-pay | Admitting: Physician Assistant

## 2020-08-04 DIAGNOSIS — I10 Essential (primary) hypertension: Secondary | ICD-10-CM

## 2020-08-04 NOTE — Telephone Encounter (Signed)
Please reach out to patient to schedule a follow up appointment per last AVS.

## 2020-08-18 ENCOUNTER — Other Ambulatory Visit: Payer: Self-pay

## 2020-08-18 ENCOUNTER — Other Ambulatory Visit: Payer: Medicare Other

## 2020-08-18 DIAGNOSIS — E559 Vitamin D deficiency, unspecified: Secondary | ICD-10-CM | POA: Diagnosis not present

## 2020-08-18 DIAGNOSIS — E785 Hyperlipidemia, unspecified: Secondary | ICD-10-CM

## 2020-08-18 DIAGNOSIS — I10 Essential (primary) hypertension: Secondary | ICD-10-CM

## 2020-08-18 DIAGNOSIS — E782 Mixed hyperlipidemia: Secondary | ICD-10-CM

## 2020-08-19 LAB — COMPREHENSIVE METABOLIC PANEL
ALT: 27 IU/L (ref 0–32)
AST: 26 IU/L (ref 0–40)
Albumin/Globulin Ratio: 2 (ref 1.2–2.2)
Albumin: 4.8 g/dL (ref 3.8–4.8)
Alkaline Phosphatase: 65 IU/L (ref 44–121)
BUN/Creatinine Ratio: 18 (ref 12–28)
BUN: 18 mg/dL (ref 8–27)
Bilirubin Total: 0.3 mg/dL (ref 0.0–1.2)
CO2: 24 mmol/L (ref 20–29)
Calcium: 10.7 mg/dL — ABNORMAL HIGH (ref 8.7–10.3)
Chloride: 99 mmol/L (ref 96–106)
Creatinine, Ser: 1 mg/dL (ref 0.57–1.00)
Globulin, Total: 2.4 g/dL (ref 1.5–4.5)
Glucose: 97 mg/dL (ref 65–99)
Potassium: 4.5 mmol/L (ref 3.5–5.2)
Sodium: 137 mmol/L (ref 134–144)
Total Protein: 7.2 g/dL (ref 6.0–8.5)
eGFR: 62 mL/min/{1.73_m2} (ref >59)

## 2020-08-19 LAB — CBC
Hematocrit: 44.3 % (ref 34.0–46.6)
Hemoglobin: 15 g/dL (ref 11.1–15.9)
MCH: 30.2 pg (ref 26.6–33.0)
MCHC: 33.9 g/dL (ref 31.5–35.7)
MCV: 89 fL (ref 79–97)
Platelets: 259 10*3/uL (ref 150–450)
RBC: 4.96 x10E6/uL (ref 3.77–5.28)
RDW: 12.9 % (ref 11.7–15.4)
WBC: 7.1 10*3/uL (ref 3.4–10.8)

## 2020-08-19 LAB — LIPID PANEL
Chol/HDL Ratio: 2.9 ratio (ref 0.0–4.4)
Cholesterol, Total: 178 mg/dL (ref 100–199)
HDL: 61 mg/dL (ref >39)
LDL Chol Calc (NIH): 94 mg/dL (ref 0–99)
Triglycerides: 130 mg/dL (ref 0–149)
VLDL Cholesterol Cal: 23 mg/dL (ref 5–40)

## 2020-08-19 LAB — HEMOGLOBIN A1C
Est. average glucose Bld gHb Est-mCnc: 120 mg/dL
Hgb A1c MFr Bld: 5.8 % — ABNORMAL HIGH (ref 4.8–5.6)

## 2020-08-19 LAB — TSH: TSH: 1.44 u[IU]/mL (ref 0.450–4.500)

## 2020-08-19 LAB — VITAMIN D 25 HYDROXY (VIT D DEFICIENCY, FRACTURES): Vit D, 25-Hydroxy: 89.1 ng/mL (ref 30.0–100.0)

## 2020-10-13 ENCOUNTER — Other Ambulatory Visit: Payer: Self-pay | Admitting: Physician Assistant

## 2020-10-13 DIAGNOSIS — E781 Pure hyperglyceridemia: Secondary | ICD-10-CM

## 2020-10-13 DIAGNOSIS — E785 Hyperlipidemia, unspecified: Secondary | ICD-10-CM

## 2020-11-05 ENCOUNTER — Other Ambulatory Visit: Payer: Self-pay | Admitting: Physician Assistant

## 2020-11-05 DIAGNOSIS — Z1231 Encounter for screening mammogram for malignant neoplasm of breast: Secondary | ICD-10-CM

## 2020-11-05 DIAGNOSIS — Z780 Asymptomatic menopausal state: Secondary | ICD-10-CM

## 2020-11-05 DIAGNOSIS — E785 Hyperlipidemia, unspecified: Secondary | ICD-10-CM

## 2020-11-05 DIAGNOSIS — I10 Essential (primary) hypertension: Secondary | ICD-10-CM

## 2020-11-05 DIAGNOSIS — Z1211 Encounter for screening for malignant neoplasm of colon: Secondary | ICD-10-CM

## 2020-11-05 DIAGNOSIS — E781 Pure hyperglyceridemia: Secondary | ICD-10-CM

## 2020-11-05 DIAGNOSIS — E559 Vitamin D deficiency, unspecified: Secondary | ICD-10-CM

## 2020-11-05 DIAGNOSIS — E2839 Other primary ovarian failure: Secondary | ICD-10-CM

## 2020-11-06 ENCOUNTER — Other Ambulatory Visit: Payer: Self-pay | Admitting: Physician Assistant

## 2020-11-06 DIAGNOSIS — I10 Essential (primary) hypertension: Secondary | ICD-10-CM

## 2020-12-02 ENCOUNTER — Ambulatory Visit: Payer: Medicare Other | Admitting: Physician Assistant

## 2020-12-30 ENCOUNTER — Ambulatory Visit: Payer: Medicare Other | Admitting: Physician Assistant

## 2021-01-19 DIAGNOSIS — H04123 Dry eye syndrome of bilateral lacrimal glands: Secondary | ICD-10-CM | POA: Diagnosis not present

## 2021-01-19 DIAGNOSIS — H2513 Age-related nuclear cataract, bilateral: Secondary | ICD-10-CM | POA: Diagnosis not present

## 2021-01-19 DIAGNOSIS — H524 Presbyopia: Secondary | ICD-10-CM | POA: Diagnosis not present

## 2021-01-19 DIAGNOSIS — H40013 Open angle with borderline findings, low risk, bilateral: Secondary | ICD-10-CM | POA: Diagnosis not present

## 2021-01-24 ENCOUNTER — Other Ambulatory Visit: Payer: Self-pay | Admitting: Physician Assistant

## 2021-01-24 DIAGNOSIS — E785 Hyperlipidemia, unspecified: Secondary | ICD-10-CM

## 2021-01-24 DIAGNOSIS — E781 Pure hyperglyceridemia: Secondary | ICD-10-CM

## 2021-01-25 ENCOUNTER — Other Ambulatory Visit: Payer: Self-pay

## 2021-01-25 ENCOUNTER — Ambulatory Visit (INDEPENDENT_AMBULATORY_CARE_PROVIDER_SITE_OTHER): Payer: Medicare Other | Admitting: Physician Assistant

## 2021-01-25 ENCOUNTER — Encounter: Payer: Self-pay | Admitting: Physician Assistant

## 2021-01-25 VITALS — BP 107/71 | HR 74 | Temp 97.2°F | Ht 65.0 in | Wt 167.0 lb

## 2021-01-25 DIAGNOSIS — I10 Essential (primary) hypertension: Secondary | ICD-10-CM | POA: Diagnosis not present

## 2021-01-25 DIAGNOSIS — E785 Hyperlipidemia, unspecified: Secondary | ICD-10-CM | POA: Diagnosis not present

## 2021-01-25 DIAGNOSIS — E781 Pure hyperglyceridemia: Secondary | ICD-10-CM | POA: Diagnosis not present

## 2021-01-25 DIAGNOSIS — R7303 Prediabetes: Secondary | ICD-10-CM | POA: Diagnosis not present

## 2021-01-25 NOTE — Patient Instructions (Signed)

## 2021-01-25 NOTE — Assessment & Plan Note (Signed)
-  Last A1c 5.8, will repeat A1c today. -Discussed low carbohydrate and glucose diet. -Will continue to monitor.

## 2021-01-25 NOTE — Assessment & Plan Note (Signed)
-  Controlled. -Continue current medication regimen. -Will collect CMP for medication monitoring. -Will continue to monitor. 

## 2021-01-25 NOTE — Progress Notes (Signed)
Established Patient Office Visit  Subjective:  Patient ID: Dana Hodge, female    DOB: 08-10-1950  Age: 70 y.o. MRN: [id number]  CC:  Chief Complaint  Patient presents with   Follow-up    HPI Dana Hodge presents for follow up on hypertension, hyperlipidemia and prediabetes. Patent has no acute concerns.  HTN: Pt denies chest pain, palpitations, dizziness or lower extremity swelling. Taking medication as directed without side effects. Patient reports has not been drinking as much water as she does during the summer.  HLD: Pt taking medication as directed without issues. Reports diet has not been the best with holiday season and stays active with errands and house work, does not have routine exercise regimen.  Prediabetes: Patient is on diuretic therapy. Denies increased thirst or urination from baseline.  Past Medical History:  Diagnosis Date   Chicken pox    Colon polyp    Hyperlipidemia    Hypertension    UTI (urinary tract infection)     Past Surgical History:  Procedure Laterality Date   APPENDECTOMY     COLONOSCOPY  09/03/2009   hyperplastic polyps   LAPAROSCOPIC APPENDECTOMY N/A 01/15/2016   Procedure: APPENDECTOMY LAPAROSCOPIC. DRAINAGE OF INTRA ABDOMINAL ABSCESSES X 3, LYSIS OF ADHESIONS;  Surgeon: Michael Boston, MD;  Location: [facility];  Service: General;  Laterality: N/A;    Family History  Problem Relation Age of Onset   Heart disease Mother 63       CAD   Hypertension Mother    Hyperlipidemia Mother    Heart disease Father 49       MI   COPD Father    Cancer Maternal Uncle        colon, pancreatic   Cancer Maternal Grandfather        colon, pancreatic   Hypertension Sister    Alcoholism Paternal Uncle    Stroke Maternal Grandmother    Colon cancer Neg Hx    Colon polyps Neg Hx    Esophageal cancer Neg Hx    Rectal cancer Neg Hx    Stomach cancer Neg Hx     Social History   Socioeconomic History   Marital status: Married    Spouse name: Not  on file   Number of children: Not on file   Years of education: Not on file   Highest education level: Not on file  Occupational History   Not on file  Tobacco Use   Smoking status: Never   Smokeless tobacco: Never  Vaping Use   Vaping Use: Never used  Substance and Sexual Activity   Alcohol use: Yes    Comment: a glass of wine with dinner    Drug use: No   Sexual activity: Yes    Birth control/protection: None  Other Topics Concern   Not on file  Social History Narrative   Work or School: works as Water quality scientist, office work      Home Situation: lives with husband and handicapped adult son      Spiritual Beliefs: Christian      Lifestyle: going to the gym 3-4 days per week, working on diet - portion sizes are an issue            Social Determinants of Radio broadcast assistant Strain: Not on file  Food Insecurity: Not on file  Transportation Needs: Not on file  Physical Activity: Not on file  Stress: Not on file  Social Connections: Not on file  Intimate  Partner Violence: Not on file    Outpatient Medications Prior to Visit  Medication Sig Dispense Refill   b complex vitamins tablet Take 1 tablet by mouth daily.     calcium carbonate (TUMS - DOSED IN MG ELEMENTAL CALCIUM) 500 MG chewable tablet Chew 1 tablet by mouth daily.     co-enzyme Q-10 30 MG capsule Take 200 mg by mouth daily.     hydrochlorothiazide (MICROZIDE) 12.5 MG capsule Take 1 capsule by mouth once daily 90 capsule 0   losartan (COZAAR) 50 MG tablet Take 1 tablet by mouth once daily 90 tablet 0   rosuvastatin (CRESTOR) 5 MG tablet Take 1 tablet by mouth once daily 90 tablet 0   vitamin B-12 (CYANOCOBALAMIN) 500 MCG tablet Take 500 mcg by mouth daily.     Vitamin D, Ergocalciferol, (DRISDOL) 1.25 MG (50000 UNIT) CAPS capsule Take 1 capsule (50,000 Units total) by mouth every 7 (seven) days. 30 capsule 0   zinc gluconate 50 MG tablet Take 50 mg by mouth daily.     No facility-administered  medications prior to visit.    No Known Allergies  ROS Review of Systems Review of Systems:  A fourteen system review of systems was performed and found to be positive as per HPI.  Objective:    Physical Exam General:  Well Developed, well nourished, appropriate for stated age.  Neuro:  Alert and oriented,  extra-ocular muscles intact  HEENT:  Normocephalic, atraumatic, neck supple Skin:  no gross rash, warm, pink. Cardiac:  RRR, S1 S2 Respiratory: CTA B/L w/o wheezing, crackles or rales, Not using accessory muscles, speaking in full sentences- unlabored. Vascular:  Ext warm, no cyanosis apprec.; cap RF less 2 sec. No edema  Psych:  No HI/SI, judgement and insight good, Euthymic mood. Full Affect.  BP 107/71   Pulse 74   Temp (!) 97.2 F (36.2 C)   Ht '5\' 5"'  (1.651 m)   Wt 167 lb (75.8 kg)   SpO2 100%   BMI 27.79 kg/m  Wt Readings from Last 3 Encounters:  01/25/21 167 lb (75.8 kg)  06/02/20 167 lb (75.8 kg)  11/26/19 176 lb 3.2 oz (79.9 kg)     Health Maintenance Due  Topic Date Due   Pneumonia Vaccine 29+ Years old (1 - PCV) Never done   Zoster Vaccines- Shingrix (1 of 2) Never done   COVID-19 Vaccine (3 - Pfizer risk series) 06/03/2019   INFLUENZA VACCINE  09/20/2020    There are no preventive care reminders to display for this patient.  Lab Results  Component Value Date   TSH 1.440 08/18/2020   Lab Results  Component Value Date   WBC 7.1 08/18/2020   HGB 15.0 08/18/2020   HCT 44.3 08/18/2020   MCV 89 08/18/2020   PLT 259 08/18/2020   Lab Results  Component Value Date   NA 137 08/18/2020   K 4.5 08/18/2020   CO2 24 08/18/2020   GLUCOSE 97 08/18/2020   BUN 18 08/18/2020   CREATININE 1.00 08/18/2020   BILITOT 0.3 08/18/2020   ALKPHOS 65 08/18/2020   AST 26 08/18/2020   ALT 27 08/18/2020   PROT 7.2 08/18/2020   ALBUMIN 4.8 08/18/2020   CALCIUM 10.7 (H) 08/18/2020   ANIONGAP 9 01/18/2016   EGFR 62 08/18/2020   GFR 70.86 12/23/2015   Lab  Results  Component Value Date   CHOL 178 08/18/2020   Lab Results  Component Value Date   HDL 61 08/18/2020  Lab Results  Component Value Date   LDLCALC 94 08/18/2020   Lab Results  Component Value Date   TRIG 130 08/18/2020   Lab Results  Component Value Date   CHOLHDL 2.9 08/18/2020   Lab Results  Component Value Date   HGBA1C 5.8 (H) 08/18/2020      Assessment & Plan:   Problem List Items Addressed This Visit       Cardiovascular and Mediastinum   Essential hypertension    -Controlled. -Continue current medication regimen. -Will collect CMP for medication monitoring. -Will continue to monitor.      Relevant Orders   Comp Met (CMET)   CBC w/Diff     Other   Prediabetes    -Last A1c 5.8, will repeat A1c today. -Discussed low carbohydrate and glucose diet. -Will continue to monitor.      Relevant Orders   Comp Met (CMET)   CBC w/Diff   HgB A1c   Hypertriglyceridemia   Relevant Orders   Comp Met (CMET)   CBC w/Diff   Other Visit Diagnoses     Hyperlipidemia, unspecified hyperlipidemia type    -  Primary   Relevant Orders   Comp Met (CMET)   CBC w/Diff   Lipid Profile      Hyperlipidemia and Hypertriglyceridemia: -Last lipid panel wnl's, patient is fasting will repeat lipid panel and hepatic function. -Continue current medication regimen. -Discussed low fat and carbohydrate diet and increasing physical activity.  -Will continue to monitor.   No orders of the defined types were placed in this encounter.   Follow-up: Return in about 6 months (around 07/26/2021) for Lynd.    Lorrene Reid, PA-C

## 2021-01-26 ENCOUNTER — Other Ambulatory Visit: Payer: Self-pay | Admitting: Physician Assistant

## 2021-01-26 ENCOUNTER — Encounter: Payer: Self-pay | Admitting: Physician Assistant

## 2021-01-26 DIAGNOSIS — L299 Pruritus, unspecified: Secondary | ICD-10-CM

## 2021-01-26 LAB — COMPREHENSIVE METABOLIC PANEL
ALT: 29 IU/L (ref 0–32)
AST: 25 IU/L (ref 0–40)
Albumin/Globulin Ratio: 2.1 (ref 1.2–2.2)
Albumin: 4.9 g/dL — ABNORMAL HIGH (ref 3.8–4.8)
Alkaline Phosphatase: 68 IU/L (ref 44–121)
BUN/Creatinine Ratio: 17 (ref 12–28)
BUN: 18 mg/dL (ref 8–27)
Bilirubin Total: 0.7 mg/dL (ref 0.0–1.2)
CO2: 24 mmol/L (ref 20–29)
Calcium: 10.2 mg/dL (ref 8.7–10.3)
Chloride: 101 mmol/L (ref 96–106)
Creatinine, Ser: 1.04 mg/dL — ABNORMAL HIGH (ref 0.57–1.00)
Globulin, Total: 2.3 g/dL (ref 1.5–4.5)
Glucose: 94 mg/dL (ref 70–99)
Potassium: 4.5 mmol/L (ref 3.5–5.2)
Sodium: 139 mmol/L (ref 134–144)
Total Protein: 7.2 g/dL (ref 6.0–8.5)
eGFR: 59 mL/min/{1.73_m2} — ABNORMAL LOW (ref >59)

## 2021-01-26 LAB — CBC WITH DIFFERENTIAL/PLATELET
Basophils Absolute: 0.1 10*3/uL (ref 0.0–0.2)
Basos: 1 %
EOS (ABSOLUTE): 0.1 10*3/uL (ref 0.0–0.4)
Eos: 1 %
Hematocrit: 44.9 % (ref 34.0–46.6)
Hemoglobin: 14.9 g/dL (ref 11.1–15.9)
Immature Grans (Abs): 0 10*3/uL (ref 0.0–0.1)
Immature Granulocytes: 0 %
Lymphocytes Absolute: 2.9 10*3/uL (ref 0.7–3.1)
Lymphs: 36 %
MCH: 30 pg (ref 26.6–33.0)
MCHC: 33.2 g/dL (ref 31.5–35.7)
MCV: 91 fL (ref 79–97)
Monocytes Absolute: 0.5 10*3/uL (ref 0.1–0.9)
Monocytes: 6 %
Neutrophils Absolute: 4.5 10*3/uL (ref 1.4–7.0)
Neutrophils: 56 %
Platelets: 295 10*3/uL (ref 150–450)
RBC: 4.96 x10E6/uL (ref 3.77–5.28)
RDW: 12.7 % (ref 11.7–15.4)
WBC: 7.9 10*3/uL (ref 3.4–10.8)

## 2021-01-26 LAB — LIPID PANEL
Chol/HDL Ratio: 3.2 ratio (ref 0.0–4.4)
Cholesterol, Total: 190 mg/dL (ref 100–199)
HDL: 60 mg/dL (ref >39)
LDL Chol Calc (NIH): 103 mg/dL — ABNORMAL HIGH (ref 0–99)
Triglycerides: 156 mg/dL — ABNORMAL HIGH (ref 0–149)
VLDL Cholesterol Cal: 27 mg/dL (ref 5–40)

## 2021-01-26 LAB — HEMOGLOBIN A1C
Est. average glucose Bld gHb Est-mCnc: 123 mg/dL
Hgb A1c MFr Bld: 5.9 % — ABNORMAL HIGH (ref 4.8–5.6)

## 2021-01-26 MED ORDER — ACETIC ACID 2 % OT SOLN
4.0000 [drp] | Freq: Three times a day (TID) | OTIC | 1 refills | Status: DC
Start: 2021-01-26 — End: 2022-05-31

## 2021-02-07 ENCOUNTER — Other Ambulatory Visit: Payer: Self-pay | Admitting: Physician Assistant

## 2021-02-07 DIAGNOSIS — E781 Pure hyperglyceridemia: Secondary | ICD-10-CM

## 2021-02-07 DIAGNOSIS — Z780 Asymptomatic menopausal state: Secondary | ICD-10-CM

## 2021-02-07 DIAGNOSIS — I10 Essential (primary) hypertension: Secondary | ICD-10-CM

## 2021-02-07 DIAGNOSIS — Z1211 Encounter for screening for malignant neoplasm of colon: Secondary | ICD-10-CM

## 2021-02-07 DIAGNOSIS — E785 Hyperlipidemia, unspecified: Secondary | ICD-10-CM

## 2021-02-07 DIAGNOSIS — Z1231 Encounter for screening mammogram for malignant neoplasm of breast: Secondary | ICD-10-CM

## 2021-02-07 DIAGNOSIS — E2839 Other primary ovarian failure: Secondary | ICD-10-CM

## 2021-02-07 DIAGNOSIS — E559 Vitamin D deficiency, unspecified: Secondary | ICD-10-CM

## 2021-04-26 ENCOUNTER — Other Ambulatory Visit: Payer: Self-pay | Admitting: Physician Assistant

## 2021-04-26 DIAGNOSIS — E781 Pure hyperglyceridemia: Secondary | ICD-10-CM

## 2021-04-26 DIAGNOSIS — E785 Hyperlipidemia, unspecified: Secondary | ICD-10-CM

## 2021-07-26 ENCOUNTER — Other Ambulatory Visit: Payer: Self-pay | Admitting: Physician Assistant

## 2021-07-26 DIAGNOSIS — E785 Hyperlipidemia, unspecified: Secondary | ICD-10-CM

## 2021-07-26 DIAGNOSIS — E781 Pure hyperglyceridemia: Secondary | ICD-10-CM

## 2021-07-27 ENCOUNTER — Encounter: Payer: Medicare Other | Admitting: Physician Assistant

## 2021-07-29 ENCOUNTER — Other Ambulatory Visit: Payer: Self-pay | Admitting: Physician Assistant

## 2021-07-29 DIAGNOSIS — Z139 Encounter for screening, unspecified: Secondary | ICD-10-CM

## 2021-08-01 ENCOUNTER — Other Ambulatory Visit: Payer: Self-pay | Admitting: Physician Assistant

## 2021-08-01 DIAGNOSIS — E781 Pure hyperglyceridemia: Secondary | ICD-10-CM

## 2021-08-01 DIAGNOSIS — E785 Hyperlipidemia, unspecified: Secondary | ICD-10-CM

## 2021-08-02 ENCOUNTER — Ambulatory Visit
Admission: RE | Admit: 2021-08-02 | Discharge: 2021-08-02 | Disposition: A | Discharge status: Home / Self Care | Payer: Medicare Other | Source: Ambulatory Visit | Attending: Physician Assistant | Admitting: Physician Assistant

## 2021-08-02 DIAGNOSIS — Z1231 Encounter for screening mammogram for malignant neoplasm of breast: Secondary | ICD-10-CM | POA: Diagnosis not present

## 2021-08-02 DIAGNOSIS — Z139 Encounter for screening, unspecified: Secondary | ICD-10-CM

## 2021-08-06 ENCOUNTER — Other Ambulatory Visit: Payer: Self-pay | Admitting: Physician Assistant

## 2021-08-06 DIAGNOSIS — Z1231 Encounter for screening mammogram for malignant neoplasm of breast: Secondary | ICD-10-CM

## 2021-08-06 DIAGNOSIS — Z780 Asymptomatic menopausal state: Secondary | ICD-10-CM

## 2021-08-06 DIAGNOSIS — E559 Vitamin D deficiency, unspecified: Secondary | ICD-10-CM

## 2021-08-06 DIAGNOSIS — E2839 Other primary ovarian failure: Secondary | ICD-10-CM

## 2021-08-06 DIAGNOSIS — E781 Pure hyperglyceridemia: Secondary | ICD-10-CM

## 2021-08-06 DIAGNOSIS — Z1211 Encounter for screening for malignant neoplasm of colon: Secondary | ICD-10-CM

## 2021-08-06 DIAGNOSIS — E785 Hyperlipidemia, unspecified: Secondary | ICD-10-CM

## 2021-08-06 DIAGNOSIS — I10 Essential (primary) hypertension: Secondary | ICD-10-CM

## 2021-08-12 ENCOUNTER — Other Ambulatory Visit: Payer: Self-pay | Admitting: Physician Assistant

## 2021-08-12 DIAGNOSIS — I10 Essential (primary) hypertension: Secondary | ICD-10-CM

## 2021-08-22 ENCOUNTER — Other Ambulatory Visit: Payer: Self-pay | Admitting: Physician Assistant

## 2021-08-22 DIAGNOSIS — E2839 Other primary ovarian failure: Secondary | ICD-10-CM

## 2021-08-22 DIAGNOSIS — I10 Essential (primary) hypertension: Secondary | ICD-10-CM

## 2021-08-22 DIAGNOSIS — E785 Hyperlipidemia, unspecified: Secondary | ICD-10-CM

## 2021-08-22 DIAGNOSIS — Z1211 Encounter for screening for malignant neoplasm of colon: Secondary | ICD-10-CM

## 2021-08-22 DIAGNOSIS — E781 Pure hyperglyceridemia: Secondary | ICD-10-CM

## 2021-08-22 DIAGNOSIS — E559 Vitamin D deficiency, unspecified: Secondary | ICD-10-CM

## 2021-08-22 DIAGNOSIS — Z1231 Encounter for screening mammogram for malignant neoplasm of breast: Secondary | ICD-10-CM

## 2021-08-22 DIAGNOSIS — Z780 Asymptomatic menopausal state: Secondary | ICD-10-CM

## 2021-09-01 ENCOUNTER — Other Ambulatory Visit: Payer: Self-pay | Admitting: Physician Assistant

## 2021-09-01 DIAGNOSIS — I10 Essential (primary) hypertension: Secondary | ICD-10-CM

## 2021-09-01 DIAGNOSIS — E781 Pure hyperglyceridemia: Secondary | ICD-10-CM

## 2021-09-01 DIAGNOSIS — E785 Hyperlipidemia, unspecified: Secondary | ICD-10-CM

## 2021-09-01 DIAGNOSIS — Z780 Asymptomatic menopausal state: Secondary | ICD-10-CM

## 2021-09-01 DIAGNOSIS — Z1231 Encounter for screening mammogram for malignant neoplasm of breast: Secondary | ICD-10-CM

## 2021-09-01 DIAGNOSIS — E2839 Other primary ovarian failure: Secondary | ICD-10-CM

## 2021-09-01 DIAGNOSIS — Z1211 Encounter for screening for malignant neoplasm of colon: Secondary | ICD-10-CM

## 2021-09-01 DIAGNOSIS — E559 Vitamin D deficiency, unspecified: Secondary | ICD-10-CM

## 2021-09-11 ENCOUNTER — Other Ambulatory Visit: Payer: Self-pay | Admitting: Physician Assistant

## 2021-09-11 DIAGNOSIS — I10 Essential (primary) hypertension: Secondary | ICD-10-CM

## 2021-09-13 ENCOUNTER — Encounter: Payer: Self-pay | Admitting: Physician Assistant

## 2021-09-13 ENCOUNTER — Ambulatory Visit (INDEPENDENT_AMBULATORY_CARE_PROVIDER_SITE_OTHER): Payer: Medicare Other | Admitting: Physician Assistant

## 2021-09-13 VITALS — BP 108/70 | HR 78 | Ht 64.96 in | Wt 173.4 lb

## 2021-09-13 DIAGNOSIS — R7303 Prediabetes: Secondary | ICD-10-CM | POA: Diagnosis not present

## 2021-09-13 DIAGNOSIS — E559 Vitamin D deficiency, unspecified: Secondary | ICD-10-CM

## 2021-09-13 DIAGNOSIS — E782 Mixed hyperlipidemia: Secondary | ICD-10-CM

## 2021-09-13 DIAGNOSIS — E663 Overweight: Secondary | ICD-10-CM

## 2021-09-13 DIAGNOSIS — E785 Hyperlipidemia, unspecified: Secondary | ICD-10-CM | POA: Diagnosis not present

## 2021-09-13 DIAGNOSIS — I10 Essential (primary) hypertension: Secondary | ICD-10-CM

## 2021-09-13 DIAGNOSIS — Z6828 Body mass index (BMI) 28.0-28.9, adult: Secondary | ICD-10-CM

## 2021-09-13 MED ORDER — HYDROCHLOROTHIAZIDE 12.5 MG PO CAPS: 12.5000 mg | ORAL_CAPSULE | Freq: Every day | ORAL | 1 refills | Status: DC

## 2021-09-13 NOTE — Assessment & Plan Note (Signed)
-  Discussed with patient BP at lower range of normal and we can trial stopping Losartan and continue with HCTZ 12.5 mg daily. Patient is agreeable. Advised to resume monitoring BP at home few times per week. If readings are consistently >140/80 then recommend resuming Losartan 25 mg. Pt verbalized understanding. Discussed low sodium diet. Will collect CMP to monitor renal function and electrolytes.

## 2021-09-13 NOTE — Patient Instructions (Signed)

## 2021-09-13 NOTE — Assessment & Plan Note (Signed)
-  Discussed with patient recommend to continue with rosuvastatin 5 mg daily. Last lipid panel LDL was elevated at 103. Patient verbalized understanding and agreeable. Patient is fasting so will collect lipid panel and hepatic function. Will continue to continue to monitor.

## 2021-09-13 NOTE — Progress Notes (Signed)
Established patient visit   Patient: Dana Hodge   DOB: 24-Nov-1950   71 y.o. Female  MRN: [id number] Visit Date: 09/13/2021  Chief Complaint  Patient presents with   Follow-up   Subjective    HPI  Patient presents for chronic follow-up. Patient reports knows what she needs to do in order to lose weight. No routine exercise regimen she has. Knows needs to work on portion control   HTN: No headache, chest pain, palpitations, dizziness or lower extremity swelling. Taking medication as directed without side effects. Not checking BP at home lately.   HLD: Pt taking medication as directed without issues. No myalgias.     Medications: Outpatient Medications Prior to Visit  Medication Sig   acetic acid 2 % otic solution Place 4 drops into both ears 3 (three) times daily.   b complex vitamins tablet Take 1 tablet by mouth daily.   calcium carbonate (TUMS - DOSED IN MG ELEMENTAL CALCIUM) 500 MG chewable tablet Chew 1 tablet by mouth daily.   co-enzyme Q-10 30 MG capsule Take 200 mg by mouth daily.   rosuvastatin (CRESTOR) 5 MG tablet Take 1 tablet by mouth once daily   vitamin B-12 (CYANOCOBALAMIN) 500 MCG tablet Take 500 mcg by mouth daily.   Vitamin D, Ergocalciferol, (DRISDOL) 1.25 MG (50000 UNIT) CAPS capsule Take 1 capsule (50,000 Units total) by mouth every 7 (seven) days.   zinc gluconate 50 MG tablet Take 50 mg by mouth daily.   [DISCONTINUED] hydrochlorothiazide (MICROZIDE) 12.5 MG capsule Take 1 capsule by mouth once daily   [DISCONTINUED] losartan (COZAAR) 50 MG tablet Take 1 tablet by mouth once daily   No facility-administered medications prior to visit.    Review of Systems Review of Systems:  A fourteen system review of systems was performed and found to be positive as per HPI.  Last CBC Lab Results  Component Value Date   WBC 7.9 01/25/2021   HGB 14.9 01/25/2021   HCT 44.9 01/25/2021   MCV 91 01/25/2021   MCH 30.0 01/25/2021   RDW 12.7 01/25/2021   PLT 295  29/51/8841   Last metabolic panel Lab Results  Component Value Date   GLUCOSE 94 01/25/2021   NA 139 01/25/2021   K 4.5 01/25/2021   CL 101 01/25/2021   CO2 24 01/25/2021   BUN 18 01/25/2021   CREATININE 1.04 (H) 01/25/2021   EGFR 59 (L) 01/25/2021   CALCIUM 10.2 01/25/2021   PROT 7.2 01/25/2021   ALBUMIN 4.9 (H) 01/25/2021   LABGLOB 2.3 01/25/2021   AGRATIO 2.1 01/25/2021   BILITOT 0.7 01/25/2021   ALKPHOS 68 01/25/2021   AST 25 01/25/2021   ALT 29 01/25/2021   ANIONGAP 9 01/18/2016   Last lipids Lab Results  Component Value Date   CHOL 190 01/25/2021   HDL 60 01/25/2021   LDLCALC 103 (H) 01/25/2021   LDLDIRECT 152.0 12/23/2015   TRIG 156 (H) 01/25/2021   CHOLHDL 3.2 01/25/2021   Last hemoglobin A1c Lab Results  Component Value Date   HGBA1C 5.9 (H) 01/25/2021   Last thyroid functions Lab Results  Component Value Date   TSH 1.440 08/18/2020     Objective    BP 108/70   Pulse 78   Ht 5' 4.96" (1.65 m)   Wt 173 lb 6.4 oz (78.7 kg)   SpO2 96%   BMI 28.89 kg/m  BP Readings from Last 3 Encounters:  09/13/21 108/70  01/25/21 107/71  06/02/20 127/80   Wt Readings  from Last 3 Encounters:  09/13/21 173 lb 6.4 oz (78.7 kg)  01/25/21 167 lb (75.8 kg)  06/02/20 167 lb (75.8 kg)    Physical Exam  General:  Pleasant and cooperative, appropriate for stated age.  Neuro:  Alert and oriented,  extra-ocular muscles intact  HEENT:  Normocephalic, atraumatic, neck supple  Skin:  no gross rash, warm, pink. Cardiac:  RRR, S1 S2 Respiratory: CTA B/L  Vascular:  Ext warm, no cyanosis apprec.; cap RF less 2 sec. Psych:  No HI/SI, judgement and insight good, Euthymic mood. Full Affect.   No results found for any visits on 09/13/21.  Assessment & Plan      Problem List Items Addressed This Visit       Cardiovascular and Mediastinum   Essential hypertension - Primary    -Discussed with patient BP at lower range of normal and we can trial stopping Losartan and  continue with HCTZ 12.5 mg daily. Patient is agreeable. Advised to resume monitoring BP at home few times per week. If readings are consistently >140/80 then recommend resuming Losartan 25 mg. Pt verbalized understanding. Discussed low sodium diet. Will collect CMP to monitor renal function and electrolytes.       Relevant Medications   hydrochlorothiazide (MICROZIDE) 12.5 MG capsule   Other Relevant Orders   CBC w/Diff   Comp Met (CMET)     Other   Mixed hyperlipidemia    -Discussed with patient recommend to continue with rosuvastatin 5 mg daily. Last lipid panel LDL was elevated at 103. Patient verbalized understanding and agreeable. Patient is fasting so will collect lipid panel and hepatic function. Will continue to continue to monitor.      Relevant Medications   hydrochlorothiazide (MICROZIDE) 12.5 MG capsule   Other Relevant Orders   Lipid Profile   Vitamin D deficiency   Relevant Orders   Vitamin D (25 hydroxy)   Prediabetes   Relevant Orders   HgB A1c   Other Visit Diagnoses     Overweight with body mass index (BMI) of 28 to 28.9 in adult          Overweight with body mass index (BMI) of 28 to 28.9 in adult: -Associated with hypertension, prediabetes and hyperlipidemia. -Patient has gained 6 pounds since last visit. Discussed with patient Weight Watcher's, intermittent fasting and diet and lifestyle changes. Will continue to monitor.  Return in about 4 months (around 01/14/2022) for Ramirez-Perez.        Lorrene Reid, PA-C  [facility] [phone] (phone) [phone] (fax)  [place]

## 2021-09-14 LAB — CBC WITH DIFFERENTIAL/PLATELET
Basophils Absolute: 0.1 10*3/uL (ref 0.0–0.2)
Basos: 1 %
EOS (ABSOLUTE): 0.1 10*3/uL (ref 0.0–0.4)
Eos: 1 %
Hematocrit: 43.1 % (ref 34.0–46.6)
Hemoglobin: 14.6 g/dL (ref 11.1–15.9)
Immature Grans (Abs): 0 10*3/uL (ref 0.0–0.1)
Immature Granulocytes: 0 %
Lymphocytes Absolute: 2.9 10*3/uL (ref 0.7–3.1)
Lymphs: 39 %
MCH: 30.5 pg (ref 26.6–33.0)
MCHC: 33.9 g/dL (ref 31.5–35.7)
MCV: 90 fL (ref 79–97)
Monocytes Absolute: 0.4 10*3/uL (ref 0.1–0.9)
Monocytes: 6 %
Neutrophils Absolute: 4 10*3/uL (ref 1.4–7.0)
Neutrophils: 53 %
Platelets: 255 10*3/uL (ref 150–450)
RBC: 4.78 x10E6/uL (ref 3.77–5.28)
RDW: 13.1 % (ref 11.7–15.4)
WBC: 7.5 10*3/uL (ref 3.4–10.8)

## 2021-09-14 LAB — VITAMIN D 25 HYDROXY (VIT D DEFICIENCY, FRACTURES): Vit D, 25-Hydroxy: 51.3 ng/mL (ref 30.0–100.0)

## 2021-09-14 LAB — LIPID PANEL
Chol/HDL Ratio: 3.1 ratio (ref 0.0–4.4)
Cholesterol, Total: 187 mg/dL (ref 100–199)
HDL: 61 mg/dL (ref >39)
LDL Chol Calc (NIH): 99 mg/dL (ref 0–99)
Triglycerides: 154 mg/dL — ABNORMAL HIGH (ref 0–149)
VLDL Cholesterol Cal: 27 mg/dL (ref 5–40)

## 2021-09-14 LAB — COMPREHENSIVE METABOLIC PANEL
ALT: 26 IU/L (ref 0–32)
AST: 26 IU/L (ref 0–40)
Albumin/Globulin Ratio: 2.2 (ref 1.2–2.2)
Albumin: 4.9 g/dL (ref 3.9–4.9)
Alkaline Phosphatase: 71 IU/L (ref 44–121)
BUN/Creatinine Ratio: 15 (ref 12–28)
BUN: 15 mg/dL (ref 8–27)
Bilirubin Total: 0.4 mg/dL (ref 0.0–1.2)
CO2: 23 mmol/L (ref 20–29)
Calcium: 10 mg/dL (ref 8.7–10.3)
Chloride: 100 mmol/L (ref 96–106)
Creatinine, Ser: 1.03 mg/dL — ABNORMAL HIGH (ref 0.57–1.00)
Globulin, Total: 2.2 g/dL (ref 1.5–4.5)
Glucose: 96 mg/dL (ref 70–99)
Potassium: 4.8 mmol/L (ref 3.5–5.2)
Sodium: 139 mmol/L (ref 134–144)
Total Protein: 7.1 g/dL (ref 6.0–8.5)
eGFR: 59 mL/min/{1.73_m2} — ABNORMAL LOW (ref >59)

## 2021-09-14 LAB — HEMOGLOBIN A1C
Est. average glucose Bld gHb Est-mCnc: 117 mg/dL
Hgb A1c MFr Bld: 5.7 % — ABNORMAL HIGH (ref 4.8–5.6)

## 2022-01-08 ENCOUNTER — Other Ambulatory Visit: Payer: Self-pay | Admitting: Physician Assistant

## 2022-01-08 DIAGNOSIS — E781 Pure hyperglyceridemia: Secondary | ICD-10-CM

## 2022-01-08 DIAGNOSIS — E785 Hyperlipidemia, unspecified: Secondary | ICD-10-CM

## 2022-01-25 ENCOUNTER — Encounter: Payer: Medicare Other | Admitting: Physician Assistant

## 2022-01-25 ENCOUNTER — Ambulatory Visit (INDEPENDENT_AMBULATORY_CARE_PROVIDER_SITE_OTHER): Payer: Medicare Other | Admitting: Nurse Practitioner

## 2022-01-25 ENCOUNTER — Encounter: Payer: Self-pay | Admitting: Nurse Practitioner

## 2022-01-25 VITALS — BP 130/86 | HR 74 | Resp 18 | Ht 64.96 in | Wt 174.0 lb

## 2022-01-25 DIAGNOSIS — I10 Essential (primary) hypertension: Secondary | ICD-10-CM

## 2022-01-25 DIAGNOSIS — Z0000 Encounter for general adult medical examination without abnormal findings: Secondary | ICD-10-CM

## 2022-01-25 DIAGNOSIS — R7303 Prediabetes: Secondary | ICD-10-CM | POA: Diagnosis not present

## 2022-01-25 DIAGNOSIS — E559 Vitamin D deficiency, unspecified: Secondary | ICD-10-CM

## 2022-01-25 DIAGNOSIS — E782 Mixed hyperlipidemia: Secondary | ICD-10-CM | POA: Diagnosis not present

## 2022-01-25 MED ORDER — 3 SERIES BP MONITOR/WRIST DEVI: 1 refills | Status: AC

## 2022-01-25 NOTE — Progress Notes (Signed)
Subjective:   Dana Hodge is a 71 y.o. female who presents for Medicare Annual (Subsequent) preventive examination. Hypertension  --generally well managed. Slightly elevated today  -she did stop losartan after her last visit.  -states that she has felt as though her blood pressure is elevated  -has noted increased hearing loss.likes lower, bass sounds than the higher, treble sounds   -She denies chest pain, chest pressure, or shortness of breath. She denies headaches or visual disturbances. She denies abdominal pain, nausea, vomiting, or changes in bowel or bladder habits.      Review of Systems    Refer to HPI       Objective:    Today's Vitals   01/25/22 1308 01/25/22 1451  BP: (Abnormal) 141/89 130/86  Pulse: 76 74  Resp: 18   SpO2: 98%   Weight: 174 lb (78.9 kg)   Height: 5' 4.96" (1.65 m)    Body mass index is 28.99 kg/m.    Row Labels 10/14/2019   12:48 PM 10/17/2017    2:55 PM 01/15/2016   11:00 AM 01/13/2016    2:00 AM 01/05/2016    5:47 PM  Advanced Directives   Section Header. No data exists in this row.       Does Patient Have a Medical Advance Directive?   No No  No No  Would patient like information on creating a medical advance directive?   No - Patient declined Yes (MAU/Ambulatory/Procedural Areas - Information given) No - Patient declined      Current Medications (verified) Outpatient Encounter Medications as of 01/25/2022  Medication Sig   b complex vitamins tablet Take 1 tablet by mouth daily.   Blood Pressure Monitoring (3 SERIES BP MONITOR/WRIST) DEVI Blood pressure monitoring at home 1 to 2 times daily prn   calcium carbonate (TUMS - DOSED IN MG ELEMENTAL CALCIUM) 500 MG chewable tablet Chew 1 tablet by mouth daily.   Cholecalciferol (D3 ADULT PO) Take 1 tablet by mouth daily.   co-enzyme Q-10 30 MG capsule Take 200 mg by mouth daily.   hydrochlorothiazide (MICROZIDE) 12.5 MG capsule Take 1 capsule (12.5 mg total) by mouth daily.   KRILL OIL  PO Take 1 tablet by mouth daily.   rosuvastatin (CRESTOR) 5 MG tablet Take 1 tablet by mouth once daily   Vitamin D, Ergocalciferol, (DRISDOL) 1.25 MG (50000 UNIT) CAPS capsule Take 1 capsule (50,000 Units total) by mouth every 7 (seven) days.   acetic acid 2 % otic solution Place 4 drops into both ears 3 (three) times daily. (Patient not taking: Reported on 01/25/2022)   vitamin B-12 (CYANOCOBALAMIN) 500 MCG tablet Take 500 mcg by mouth daily. (Patient not taking: Reported on 01/25/2022)   zinc gluconate 50 MG tablet Take 50 mg by mouth daily. (Patient not taking: Reported on 01/25/2022)   No facility-administered encounter medications on file as of 01/25/2022.    Allergies (verified) Patient has no known allergies.   History: Past Medical History:  Diagnosis Date   Chicken pox    Colon polyp    Hyperlipidemia    Hypertension    UTI (urinary tract infection)    Past Surgical History:  Procedure Laterality Date   APPENDECTOMY     COLONOSCOPY  09/03/2009   hyperplastic polyps   LAPAROSCOPIC APPENDECTOMY N/A 01/15/2016   Procedure: APPENDECTOMY LAPAROSCOPIC. DRAINAGE OF INTRA ABDOMINAL ABSCESSES X 3, LYSIS OF ADHESIONS;  Surgeon: Michael Boston, MD;  Location: [facility];  Service: General;  Laterality: N/A;  Family History  Problem Relation Age of Onset   Heart disease Mother 32       CAD   Hypertension Mother    Hyperlipidemia Mother    Heart disease Father 57       MI   COPD Father    Hypertension Sister    Cancer Maternal Uncle        colon, pancreatic   Alcoholism Paternal Uncle    Stroke Maternal Grandmother    Cancer Maternal Grandfather        colon, pancreatic   Colon cancer Neg Hx    Colon polyps Neg Hx    Esophageal cancer Neg Hx    Rectal cancer Neg Hx    Stomach cancer Neg Hx    Breast cancer Neg Hx    Social History   Socioeconomic History   Marital status: Married    Spouse name: Not on file   Number of children: Not on file   Years of education: Not on  file   Highest education level: Not on file  Occupational History   Not on file  Tobacco Use   Smoking status: Never   Smokeless tobacco: Never  Vaping Use   Vaping Use: Never used  Substance and Sexual Activity   Alcohol use: Yes    Comment: a glass of wine with dinner    Drug use: No   Sexual activity: Yes    Birth control/protection: None  Other Topics Concern   Not on file  Social History Narrative   Work or School: works as Water quality scientist, office work      Home Situation: lives with husband and handicapped adult son      Spiritual Beliefs: Christian      Lifestyle: going to the gym 3-4 days per week, working on diet - portion sizes are an issue            Social Determinants of Radio broadcast assistant Strain: Not on file  Food Insecurity: Not on file  Transportation Needs: Not on file  Physical Activity: Not on file  Stress: Not on file  Social Connections: Not on file  Physical Exam Vitals and nursing note reviewed.  Constitutional:      Appearance: Normal appearance.  HENT:     Head: Normocephalic and atraumatic.     Right Ear: Tympanic membrane, ear canal and external ear normal.     Left Ear: Tympanic membrane, ear canal and external ear normal.     Nose: Nose normal.     Mouth/Throat:     Mouth: Mucous membranes are moist.     Pharynx: Oropharynx is clear.  Eyes:     Extraocular Movements: Extraocular movements intact.     Conjunctiva/sclera: Conjunctivae normal.  Neck:     Thyroid: No thyromegaly.  Cardiovascular:     Rate and Rhythm: Normal rate and regular rhythm.     Heart sounds: Normal heart sounds.  Pulmonary:     Effort: Pulmonary effort is normal.     Breath sounds: Normal breath sounds. No wheezing.  Abdominal:     General: Bowel sounds are normal. There is no distension.     Palpations: Abdomen is soft. There is no mass.     Tenderness: There is no abdominal tenderness. There is no right CVA tenderness, left CVA tenderness,  guarding or rebound.     Hernia: No hernia is present.  Musculoskeletal:        General: Normal range of motion.  Cervical back: Normal range of motion and neck supple.  Skin:    General: Skin is warm and dry.  Neurological:     General: No focal deficit present.     Mental Status: She is alert and oriented to person, place, and time. Mental status is at baseline.  Psychiatric:        Mood and Affect: Mood and affect normal.        Behavior: Behavior normal.        Thought Content: Thought content normal.        Judgment: Judgment normal.      Tobacco Counseling Counseling given: Not Answered   Clinical Intake:  Pre-visit preparation completed: Yes  Pain : No/denies pain     BMI - recorded: 28.99 Nutritional Status: BMI 25 -29 Overweight Nutritional Risks: None  How often do you need to have someone help you when you read instructions, pamphlets, or other written materials from your doctor or pharmacy?: 1 - Never  Diabetic?No  Interpreter Needed?: No      Activities of Daily Living   Row Labels 01/25/2022    1:23 PM  In your present state of health, do you have any difficulty performing the following activities:   Section Header. No data exists in this row.   Hearing?   1  Vision?   1  Difficulty concentrating or making decisions?   1  Walking or climbing stairs?   0  Dressing or bathing?   0  Doing errands, shopping?   0    Patient Care Team: Lorrene Reid, PA-C as PCP - General  Indicate any recent Medical Services you may have received from other than [facility] providers in the past year (date may be approximate).     Assessment:  1. Encounter for Medicare annual wellness exam Annual medicare wellness visit today   2. Essential hypertension Blood pressure stable without using losartan. Order for  new blood pressure cuff sent for the patient. Advised her to monitor her blood pressure daily, around the  same time each day. Bring log to next  visit.  She understands that the goal is to have blood pressure around 130/80 or better.  - Blood Pressure Monitoring (3 SERIES BP MONITOR/WRIST) DEVI; Blood pressure monitoring at home 1 to 2 times daily prn  Dispense: 1 each; Refill: 1  3. Mixed hyperlipidemia Generally stable. Continue crestor as prescribed   4. Prediabetes Most recednt HgbA1c 5.7. will continue to monitor   5. Vitamin D deficiency Continue drisdol 50000 iu weekly     Hearing/Vision screen No results found.   Depression Screen   Row Labels 01/25/2022    1:19 PM 09/13/2021   10:43 AM 01/25/2021    2:03 PM 06/02/2020    1:43 PM 11/26/2019   10:40 AM 07/31/2019    9:08 AM 04/02/2019    8:52 AM  PHQ 2/9 Scores   Section Header. No data exists in this row.         PHQ - 2 Score   0 0 0 0 0 0 0  PHQ- 9 Score   '4 4 2 8 2 4 5    '$ Fall Risk   Row Labels 01/25/2022    1:23 PM 01/25/2021    2:02 PM 06/02/2020    1:42 PM 11/26/2019   10:39 AM 04/02/2019    8:51 AM  Fall Risk    Section Header. No data exists in this row.       Falls in  the past year?   0 0 1 0 0  Number falls in past yr:   0 0 0  0  Injury with Fall?   0 0 0  0  Risk for fall due to :    Impaired balance/gait     Follow up    Falls evaluation completed Falls evaluation completed Falls evaluation completed Falls evaluation completed    FALL RISK PREVENTION PERTAINING TO THE HOME:  Any stairs in or around the home? Yes  If so, are there any without handrails? Yes  Home free of loose throw rugs in walkways, pet beds, electrical cords, etc? No  Adequate lighting in your home to reduce risk of falls? Yes   ASSISTIVE DEVICES UTILIZED TO PREVENT FALLS:  Life alert? No  Use of a cane, walker or w/c? No  Grab bars in the bathroom? No  Shower chair or bench in shower? No  Elevated toilet seat or a handicapped toilet? No   TIMED UP AND GO:  Was the test performed? Yes .  Length of time to ambulate 10 feet: 10-15 sec.   Gait steady and fast without use of  assistive device  Cognitive Function:       Row Labels 01/25/2022    1:18 PM 06/02/2020    1:46 PM  6CIT Screen   Section Header. No data exists in this row.    What Year?   0 points 0 points  What month?   0 points 0 points  What time?   0 points 0 points  Count back from 20   0 points 0 points  Months in reverse   0 points 0 points  Repeat phrase   0 points 0 points  Total Score   0 points 0 points    Immunizations Immunization History  Administered Date(s) Administered   Influenza Whole 01/12/2009, 01/05/2010   Influenza,inj,Quad PF,6+ Mos 01/08/2015, 12/23/2015, 01/22/2018   PFIZER(Purple Top)SARS-COV-2 Vaccination 04/14/2019, 05/06/2019   Td 01/12/2009   Tdap 01/22/2018   Zoster, Live 06/05/2014    TDAP status: Up to date  Flu Vaccine status: Declined, Education has been provided regarding the importance of this vaccine but patient still declined. Advised may receive this vaccine at local pharmacy or Health Dept. Aware to provide a copy of the vaccination record if obtained from local pharmacy or Health Dept. Verbalized acceptance and understanding.  Pneumococcal vaccine status: Declined,  Education has been provided regarding the importance of this vaccine but patient still declined. Advised may receive this vaccine at local pharmacy or Health Dept. Aware to provide a copy of the vaccination record if obtained from local pharmacy or Health Dept. Verbalized acceptance and understanding.   Covid-19 vaccine status: Information provided on how to obtain vaccines.   Qualifies for Shingles Vaccine? Yes   Zostavax completed No   Shingrix Completed?: No.    Education has been provided regarding the importance of this vaccine. Patient has been advised to call insurance company to determine out of pocket expense if they have not yet received this vaccine. Advised may also receive vaccine at local pharmacy or Health Dept. Verbalized acceptance and understanding.  Screening  Tests Health Maintenance  Topic Date Due   Zoster Vaccines- Shingrix (1 of 2) Never done   Pneumonia Vaccine 82+ Years old (1 - PCV) Never done   INFLUENZA VACCINE  09/20/2021   COVID-19 Vaccine (3 - 2023-24 season) 10/21/2021   Medicare Annual Wellness (AWV)  01/26/2023   MAMMOGRAM  08/03/2023  COLONOSCOPY (Pts 45-46yr Insurance coverage will need to be confirmed)  10/14/2026   DTaP/Tdap/Td (3 - Td or Tdap) 01/23/2028   DEXA SCAN  Completed   Hepatitis C Screening  Completed   HPV VACCINES  Aged Out    Health Maintenance  Health Maintenance Due  Topic Date Due   Zoster Vaccines- Shingrix (1 of 2) Never done   Pneumonia Vaccine 69 Years old (1 - PCV) Never done   INFLUENZA VACCINE  09/20/2021   COVID-19 Vaccine (3 - 2023-24 season) 10/21/2021    Colorectal cancer screening: Type of screening: Colonoscopy. Completed 10/14/2019. Repeat every 7 years  Mammogram status: Completed 08/02/2021. Repeat every year  Bone Density status: Completed 06/20/2019. Results reflect: Bone density results: OSTEOPENIA. Repeat every 2 years.  Lung Cancer Screening: (Low Dose CT Chest recommended if Age 69-80years, 30 pack-year currently smoking OR have quit w/in 15years.) does not qualify.   Lung Cancer Screening Referral: n/a  Additional Screening:  Hepatitis C Screening: does qualify; Completed 04/28/2016 with negative results   Vision Screening: Recommended annual ophthalmology exams for early detection of glaucoma and other disorders of the eye. Is the patient up to date with their annual eye exam?  Yes   Dental Screening: Recommended annual dental exams for proper oral hygiene  Community Resource Referral / Chronic Care Management: CRR required this visit?  No   CCM required this visit?  No      Plan:     I have personally reviewed and noted the following in the patient's chart:   Medical and social history Use of alcohol, tobacco or illicit drugs  Current medications and  supplements including opioid prescriptions. Patient is not currently taking opioid prescriptions. Functional ability and status Nutritional status Physical activity Advanced directives List of other physicians Hospitalizations, surgeries, and ER visits in previous 12 months Vitals Screenings to include cognitive, depression, and falls Referrals and appointments  In addition, I have reviewed and discussed with patient certain preventive protocols, quality metrics, and best practice recommendations. A written personalized care plan for preventive services as well as general preventive health recommendations were provided to patient.     HRonnell Freshwater NP   02/14/2022   Nurse Notes: face to face 25 min

## 2022-02-13 ENCOUNTER — Other Ambulatory Visit: Payer: Self-pay | Admitting: Nurse Practitioner

## 2022-02-13 DIAGNOSIS — E781 Pure hyperglyceridemia: Secondary | ICD-10-CM

## 2022-02-13 DIAGNOSIS — E785 Hyperlipidemia, unspecified: Secondary | ICD-10-CM

## 2022-03-14 ENCOUNTER — Other Ambulatory Visit: Payer: Self-pay

## 2022-03-14 DIAGNOSIS — I10 Essential (primary) hypertension: Secondary | ICD-10-CM

## 2022-03-14 MED ORDER — HYDROCHLOROTHIAZIDE 12.5 MG PO CAPS: 12.5000 mg | ORAL_CAPSULE | Freq: Every day | ORAL | 1 refills | Status: DC

## 2022-03-28 ENCOUNTER — Ambulatory Visit: Payer: Medicare Other | Admitting: Nurse Practitioner

## 2022-05-15 ENCOUNTER — Telehealth: Payer: Self-pay | Admitting: *Deleted

## 2022-05-15 DIAGNOSIS — I10 Essential (primary) hypertension: Secondary | ICD-10-CM

## 2022-05-15 MED ORDER — LOSARTAN POTASSIUM 25 MG PO TABS: 25.0000 mg | ORAL_TABLET | Freq: Every day | ORAL | 1 refills | Status: DC

## 2022-05-15 NOTE — Telephone Encounter (Signed)
Pt calling requesting a new rx to be written for losartan 25 mg.  She said she was previously taken off of it and at her last visit she was told that she could start it back so she was taking half of a 50mg  tablet and she is now out of these.  Informed her that she was to follow up around the first week of February which she said she had to cancel due to being out of town.  She has this appointment scheduled on 05/31/22 but would like to have medication sent in before appointment to [facility] on [address].

## 2022-05-15 NOTE — Telephone Encounter (Signed)
Refill sent in per patient request.  Will follow-up on blood pressure at her visit to make sure that adding losartan back gets her blood pressure to the goal.

## 2022-05-15 NOTE — Telephone Encounter (Signed)
Pt informed of below.Nickole Adamek Zimmerman Rumple, CMA ? ?

## 2022-05-18 ENCOUNTER — Other Ambulatory Visit: Payer: Self-pay | Admitting: Nurse Practitioner

## 2022-05-18 DIAGNOSIS — E781 Pure hyperglyceridemia: Secondary | ICD-10-CM

## 2022-05-18 DIAGNOSIS — E785 Hyperlipidemia, unspecified: Secondary | ICD-10-CM

## 2022-05-31 ENCOUNTER — Ambulatory Visit (INDEPENDENT_AMBULATORY_CARE_PROVIDER_SITE_OTHER): Payer: Medicare Other | Admitting: Family Medicine

## 2022-05-31 ENCOUNTER — Encounter: Payer: Self-pay | Admitting: Family Medicine

## 2022-05-31 VITALS — BP 126/84 | HR 111 | Resp 18 | Ht 64.96 in | Wt 174.0 lb

## 2022-05-31 DIAGNOSIS — E782 Mixed hyperlipidemia: Secondary | ICD-10-CM

## 2022-05-31 DIAGNOSIS — R7303 Prediabetes: Secondary | ICD-10-CM

## 2022-05-31 DIAGNOSIS — I10 Essential (primary) hypertension: Secondary | ICD-10-CM

## 2022-05-31 MED ORDER — BLOOD PRESSURE MONITOR/M CUFF MISC
1.0000 | Freq: Two times a day (BID) | 0 refills | Status: AC | PRN
Start: 2022-05-31 — End: ?

## 2022-05-31 NOTE — Progress Notes (Signed)
Established Patient Office Visit  Subjective   Patient ID: Dana Hodge, female    DOB: Mar 31, 1950  Age: 72 y.o. MRN: [id number]  Chief Complaint  Patient presents with   Hypertension    HPI Dana Hodge is a 72 y.o. female presenting today for follow up of hypertension. Hypertension: Patient here for follow-up of elevated blood pressure. She is exercising and is not adherent to low salt diet.   Pt denies chest pain, SOB, dizziness, edema, syncope, fatigue or heart palpitations. Taking losartan and hydrochlorothiazide, reports excellent compliance with treatment. Denies side effects.  Patient has been taking blood pressures at home, ranges have been around 118-120 systolic and in the 70s diastolic.  Her health insurer recently did a home visit and blood pressure was 113 systolic.  ROS Negative unless otherwise noted in HPI   Objective:     BP 126/84 (BP Location: Left Arm, Patient Position: Sitting, Cuff Size: Normal)   Pulse (!) 111   Resp 18   Ht 5' 4.96" (1.65 m)   Wt 174 lb (78.9 kg)   SpO2 97%   BMI 28.99 kg/m   Physical Exam Constitutional:      General: She is not in acute distress.    Appearance: Normal appearance.  HENT:     Head: Normocephalic and atraumatic.  Cardiovascular:     Rate and Rhythm: Normal rate and regular rhythm.     Pulses: Normal pulses.     Heart sounds: No murmur heard.    No friction rub. No gallop.  Pulmonary:     Effort: Pulmonary effort is normal. No respiratory distress.     Breath sounds: No wheezing, rhonchi or rales.  Musculoskeletal:     Cervical back: Normal range of motion.  Skin:    General: Skin is warm and dry.  Neurological:     General: No focal deficit present.     Mental Status: She is alert and oriented to person, place, and time. Mental status is at baseline.  Psychiatric:        Mood and Affect: Mood normal.        Thought Content: Thought content normal.        Judgment: Judgment normal.     Assessment &  Plan:  Essential hypertension Assessment & Plan: Blood pressure elevated on presentation 136/92, on recheck decreased to 124/86.  Home values are consistently meeting goal of <130/80.  Continue losartan 25 mg daily and HCTZ 12.5 mg daily.  Encouraged regular exercise and low-sodium diet.  We discussed obtaining a new blood pressure cuff and we will work on getting insurance to cover 1.  Recommended to call insurance to ask about over-the-counter benefits or purchase at a retailer or cold pharmacy.  Patient verbalized understanding and is agreeable with this plan.  Collecting CMP for electrolytes and renal function.  Will continue to monitor.  Orders: -     CBC with Differential/Platelet; Future -     Comprehensive metabolic panel; Future -     Blood Pressure Monitor/M Cuff; 1 each by Does not apply route 2 (two) times daily as needed.  Dispense: 1 each; Refill: 0  Mixed hyperlipidemia Assessment & Plan: Last lipid panel: LDL 99, HDL 61.  Repeating lipid panel today.  Continue rosuvastatin 5 mg daily unless lab results warrant change.  Orders: -     Lipid panel; Future  Prediabetes Assessment & Plan: Last A1c 5.7, repeating today.  Encouraged to continue to monitor sugar and carbohydrate  intake.  Will continue to monitor.  Orders: -     Hemoglobin A1c; Future    Return in about 6 months (around 11/30/2022) for follow-up for HTN, HLD, prediabetes.  Next appointment after that will be annual wellness visit in December.  Melida Quitter, PA

## 2022-05-31 NOTE — Assessment & Plan Note (Addendum)
Blood pressure elevated on presentation 136/92, on recheck decreased to 124/86.  Home values are consistently meeting goal of <130/80.  Continue losartan 25 mg daily and HCTZ 12.5 mg daily.  Encouraged regular exercise and low-sodium diet.  We discussed obtaining a new blood pressure cuff and we will work on getting insurance to cover 1.  Recommended to call insurance to ask about over-the-counter benefits or purchase at a retailer or cold pharmacy.  Patient verbalized understanding and is agreeable with this plan.  Collecting CMP for electrolytes and renal function.  Will continue to monitor.

## 2022-05-31 NOTE — Assessment & Plan Note (Signed)
Last A1c 5.7, repeating today.  Encouraged to continue to monitor sugar and carbohydrate intake.  Will continue to monitor.

## 2022-05-31 NOTE — Assessment & Plan Note (Signed)
Last lipid panel: LDL 99, HDL 61.  Repeating lipid panel today.  Continue rosuvastatin 5 mg daily unless lab results warrant change.

## 2022-06-01 ENCOUNTER — Other Ambulatory Visit: Payer: Self-pay | Admitting: Family Medicine

## 2022-06-01 DIAGNOSIS — E782 Mixed hyperlipidemia: Secondary | ICD-10-CM

## 2022-06-01 LAB — COMPREHENSIVE METABOLIC PANEL
ALT: 32 IU/L (ref 0–32)
AST: 26 IU/L (ref 0–40)
Albumin/Globulin Ratio: 2.1 (ref 1.2–2.2)
Albumin: 4.7 g/dL (ref 3.9–4.9)
Alkaline Phosphatase: 68 IU/L (ref 44–121)
BUN/Creatinine Ratio: 21 (ref 12–28)
BUN: 21 mg/dL (ref 8–27)
Bilirubin Total: 0.4 mg/dL (ref 0.0–1.2)
CO2: 22 mmol/L (ref 20–29)
Calcium: 9.9 mg/dL (ref 8.7–10.3)
Chloride: 100 mmol/L (ref 96–106)
Creatinine, Ser: 0.99 mg/dL (ref 0.57–1.00)
Globulin, Total: 2.2 g/dL (ref 1.5–4.5)
Glucose: 104 mg/dL — ABNORMAL HIGH (ref 70–99)
Potassium: 4.4 mmol/L (ref 3.5–5.2)
Sodium: 137 mmol/L (ref 134–144)
Total Protein: 6.9 g/dL (ref 6.0–8.5)
eGFR: 62 mL/min/{1.73_m2} (ref >59)

## 2022-06-01 LAB — CBC WITH DIFFERENTIAL/PLATELET
Basophils Absolute: 0.1 10*3/uL (ref 0.0–0.2)
Basos: 1 %
EOS (ABSOLUTE): 0.2 10*3/uL (ref 0.0–0.4)
Eos: 3 %
Hematocrit: 44.7 % (ref 34.0–46.6)
Hemoglobin: 15.1 g/dL (ref 11.1–15.9)
Immature Grans (Abs): 0 10*3/uL (ref 0.0–0.1)
Immature Granulocytes: 0 %
Lymphocytes Absolute: 3.2 10*3/uL — ABNORMAL HIGH (ref 0.7–3.1)
Lymphs: 42 %
MCH: 30.2 pg (ref 26.6–33.0)
MCHC: 33.8 g/dL (ref 31.5–35.7)
MCV: 89 fL (ref 79–97)
Monocytes Absolute: 0.6 10*3/uL (ref 0.1–0.9)
Monocytes: 8 %
Neutrophils Absolute: 3.5 10*3/uL (ref 1.4–7.0)
Neutrophils: 46 %
Platelets: 292 10*3/uL (ref 150–450)
RBC: 5 x10E6/uL (ref 3.77–5.28)
RDW: 12.9 % (ref 11.7–15.4)
WBC: 7.4 10*3/uL (ref 3.4–10.8)

## 2022-06-01 LAB — LIPID PANEL
Chol/HDL Ratio: 3.2 ratio (ref 0.0–4.4)
Cholesterol, Total: 197 mg/dL (ref 100–199)
HDL: 61 mg/dL (ref >39)
LDL Chol Calc (NIH): 106 mg/dL — ABNORMAL HIGH (ref 0–99)
Triglycerides: 175 mg/dL — ABNORMAL HIGH (ref 0–149)
VLDL Cholesterol Cal: 30 mg/dL (ref 5–40)

## 2022-06-01 LAB — HEMOGLOBIN A1C
Est. average glucose Bld gHb Est-mCnc: 123 mg/dL
Hgb A1c MFr Bld: 5.9 % — ABNORMAL HIGH (ref 4.8–5.6)

## 2022-06-01 MED ORDER — ROSUVASTATIN CALCIUM 10 MG PO TABS
10.0000 mg | ORAL_TABLET | Freq: Every day | ORAL | 1 refills | Status: DC
Start: 2022-06-01 — End: 2022-12-19

## 2022-08-13 ENCOUNTER — Other Ambulatory Visit: Payer: Self-pay | Admitting: Nurse Practitioner

## 2022-08-13 DIAGNOSIS — I10 Essential (primary) hypertension: Secondary | ICD-10-CM

## 2022-11-06 ENCOUNTER — Other Ambulatory Visit: Payer: Self-pay | Admitting: Family Medicine

## 2022-11-06 DIAGNOSIS — I10 Essential (primary) hypertension: Secondary | ICD-10-CM

## 2022-11-07 ENCOUNTER — Other Ambulatory Visit: Payer: Self-pay | Admitting: Family Medicine

## 2022-11-07 DIAGNOSIS — I10 Essential (primary) hypertension: Secondary | ICD-10-CM

## 2022-11-12 ENCOUNTER — Other Ambulatory Visit: Payer: Self-pay | Admitting: Nurse Practitioner

## 2022-11-12 DIAGNOSIS — I10 Essential (primary) hypertension: Secondary | ICD-10-CM

## 2022-11-13 MED ORDER — HYDROCHLOROTHIAZIDE 12.5 MG PO CAPS
12.5000 mg | ORAL_CAPSULE | Freq: Every day | ORAL | 0 refills | Status: DC
Start: 2022-11-13 — End: 2022-12-19

## 2022-12-04 ENCOUNTER — Ambulatory Visit: Payer: Medicare Other | Admitting: Family Medicine

## 2022-12-19 ENCOUNTER — Encounter: Payer: Self-pay | Admitting: Family Medicine

## 2022-12-19 ENCOUNTER — Ambulatory Visit (INDEPENDENT_AMBULATORY_CARE_PROVIDER_SITE_OTHER): Payer: Medicare Other | Admitting: Family Medicine

## 2022-12-19 VITALS — BP 128/83 | HR 72 | Resp 18 | Ht 64.96 in | Wt 176.0 lb

## 2022-12-19 DIAGNOSIS — R232 Flushing: Secondary | ICD-10-CM

## 2022-12-19 DIAGNOSIS — E782 Mixed hyperlipidemia: Secondary | ICD-10-CM | POA: Diagnosis not present

## 2022-12-19 DIAGNOSIS — R7303 Prediabetes: Secondary | ICD-10-CM | POA: Diagnosis not present

## 2022-12-19 DIAGNOSIS — I10 Essential (primary) hypertension: Secondary | ICD-10-CM | POA: Diagnosis not present

## 2022-12-19 DIAGNOSIS — L299 Pruritus, unspecified: Secondary | ICD-10-CM | POA: Diagnosis not present

## 2022-12-19 DIAGNOSIS — E538 Deficiency of other specified B group vitamins: Secondary | ICD-10-CM | POA: Diagnosis not present

## 2022-12-19 DIAGNOSIS — E559 Vitamin D deficiency, unspecified: Secondary | ICD-10-CM

## 2022-12-19 MED ORDER — LOSARTAN POTASSIUM 25 MG PO TABS: 25.0000 mg | ORAL_TABLET | Freq: Every day | ORAL | 0 refills | Status: DC

## 2022-12-19 MED ORDER — ROSUVASTATIN CALCIUM 10 MG PO TABS
10.0000 mg | ORAL_TABLET | Freq: Every day | ORAL | 1 refills | Status: DC
Start: 2022-12-19 — End: 2023-07-13

## 2022-12-19 MED ORDER — HYDROCHLOROTHIAZIDE 12.5 MG PO CAPS: 12.5000 mg | ORAL_CAPSULE | Freq: Every day | ORAL | 0 refills | Status: DC

## 2022-12-19 NOTE — Progress Notes (Signed)
Established Patient Office Visit  Subjective   Patient ID: Dana Hodge, female    DOB: 06/13/1950  Age: 72 y.o. MRN: [id number]  Chief Complaint  Patient presents with   Hyperlipidemia   Hypertension   Prediabetes    HPI Analynn A Hodge is a 72 y.o. female presenting today for follow up of hypertension, hyperlipidemia, prediabetes.  She also complains about bilateral ear itching within the internal canal that has been present for at least 5 years.  She has been to dermatology but did not have resolution after doing so.  She has tried Vaseline, hydrocortisone cream with no relief.  Denies changes in hearing, ear discharge, ear pain.  She has hot flashes about 3 times a day and wants to ensure that this is not abnormal. Hypertension: Patient here for follow-up of elevated blood pressure. She is exercising and is not adherent to low salt diet.   Pt denies chest pain, SOB, dizziness, edema, syncope, fatigue or heart palpitations. Taking losartan and hydrochlorothiazide, reports excellent compliance with treatment. Denies side effects. Hyperlipidemia: tolerating rosuvastatin 10 mg daily well with no myalgias or significant side effects.  The 10-year ASCVD risk score (Arnett DK, et al., 2019) is: 11% Prediabetes: denies hypoglycemic events, wounds or sores that are not healing well, increased thirst or urination.   Outpatient Medications Prior to Visit  Medication Sig   b complex vitamins tablet Take 1 tablet by mouth daily.   Blood Pressure Monitoring (3 SERIES BP MONITOR/WRIST) DEVI Blood pressure monitoring at home 1 to 2 times daily prn   Blood Pressure Monitoring (BLOOD PRESSURE MONITOR/M CUFF) MISC 1 each by Does not apply route 2 (two) times daily as needed.   calcium carbonate (TUMS - DOSED IN MG ELEMENTAL CALCIUM) 500 MG chewable tablet Chew 1 tablet by mouth daily.   Cholecalciferol (D3 ADULT PO) Take 1 tablet by mouth daily.   co-enzyme Q-10 30 MG capsule Take 200 mg by mouth daily.    KRILL OIL PO Take 1 tablet by mouth daily.   magnesium gluconate (MAGONATE) 500 MG tablet Take 500 mg by mouth daily.   [DISCONTINUED] hydrochlorothiazide (MICROZIDE) 12.5 MG capsule Take 1 capsule (12.5 mg total) by mouth daily.   [DISCONTINUED] losartan (COZAAR) 25 MG tablet Take 1 tablet by mouth once daily   [DISCONTINUED] rosuvastatin (CRESTOR) 10 MG tablet Take 1 tablet (10 mg total) by mouth daily.   No facility-administered medications prior to visit.    ROS Negative unless otherwise noted in HPI   Objective:     BP 128/83 (BP Location: Left Arm, Patient Position: Sitting, Cuff Size: Normal)   Pulse 72   Resp 18   Ht 5' 4.96" (1.65 m)   Wt 176 lb (79.8 kg)   SpO2 99%   BMI 29.32 kg/m   Physical Exam Constitutional:      General: She is not in acute distress.    Appearance: Normal appearance.  HENT:     Head: Normocephalic and atraumatic.     Right Ear: Tympanic membrane, ear canal and external ear normal. No drainage or swelling. No middle ear effusion. There is no impacted cerumen. Tympanic membrane is not injected or erythematous.     Left Ear: Tympanic membrane, ear canal and external ear normal. No drainage or swelling.  No middle ear effusion. There is no impacted cerumen. Tympanic membrane is not injected or erythematous.  Cardiovascular:     Rate and Rhythm: Normal rate and regular rhythm.  Heart sounds: No murmur heard.    No friction rub. No gallop.  Pulmonary:     Effort: Pulmonary effort is normal. No respiratory distress.     Breath sounds: No wheezing, rhonchi or rales.  Skin:    General: Skin is warm and dry.  Neurological:     Mental Status: She is alert and oriented to person, place, and time.     Assessment & Plan:  Essential hypertension Assessment & Plan: BP goal <130/80.  Stable, at goal.  Continue losartan 25 mg daily and hydrochlorothiazide 12.5 mg daily.  Repeat CMP for electrolytes and renal function.  Will continue to  monitor.  Orders: -     CBC with Differential/Platelet; Future -     Comprehensive metabolic panel; Future -     hydroCHLOROthiazide; Take 1 capsule (12.5 mg total) by mouth daily.  Dispense: 90 capsule; Refill: 0 -     Losartan Potassium; Take 1 tablet (25 mg total) by mouth daily.  Dispense: 90 tablet; Refill: 0  Mixed hyperlipidemia Assessment & Plan: Tolerating increased rosuvastatin dose well.  Repeat lipid panel and CMP for hepatic functioning.  Continue rosuvastatin 10 mg daily unless lab results warrant change.  Orders: -     CBC with Differential/Platelet; Future -     Comprehensive metabolic panel; Future -     Lipid panel; Future -     Rosuvastatin Calcium; Take 1 tablet (10 mg total) by mouth daily.  Dispense: 90 tablet; Refill: 1  Ear itching b/l  Assessment & Plan: Recommend trial of daily antihistamine like Zyrtec, Claritin, Allegra.  Patient states that she will purchase this over-the-counter, follow-up in about 6 weeks to see if this is effective.  If not, consider referral to ENT or allergy.   Prediabetes Assessment & Plan: Most recent A1c 5.9, repeating today.  Monitor sugar and carbohydrate intake, get routine physical activity.  Will continue to monitor.  Orders: -     Hemoglobin A1c; Future  Hot flashes -     TSH Rfx on Abnormal to Free T4; Future  Vitamin D deficiency -     VITAMIN D 25 Hydroxy (Vit-D Deficiency, Fractures); Future  B12 deficiency -     Vitamin B12; Future  Discussed recommendation for second shingles dose at the pharmacy.  We also discussed the pneumonia vaccine is recommended at age 31.  Provided additional printed information to review.  Aware that she can have this administered at the pharmacy or primary care office.  Return in about 1 month (around 01/19/2023) for follow-up for ear itching, hot flashes.    Melida Quitter, PA

## 2022-12-19 NOTE — Assessment & Plan Note (Signed)
BP goal <130/80.  Stable, at goal.  Continue losartan 25 mg daily and hydrochlorothiazide 12.5 mg daily.  Repeat CMP for electrolytes and renal function.  Will continue to monitor.

## 2022-12-19 NOTE — Patient Instructions (Addendum)
EAR ITCHING: Try a once daily medication to reduce the allergy response that often causes itching. Zyrtec (cetirizine) Claritin (loratadine) Allegra (fexofenadine)  PREVENTATIVE CARE:  You can get your second shingles dose updated at the pharmacy I provided some additional information about the pneumonia vaccine recommended for everyone starting at age 70.  If you decide that you are interested, you can have this either at the pharmacy or in our office.  HOT FLASHES: We will check labs to make sure that there are no other potential causes of your hot flashes.  If labs all come back within normal limits, then it is most likely due to postmenopausal changes.  If that is the case, we can discuss management options if needed.

## 2022-12-19 NOTE — Assessment & Plan Note (Signed)
Recommend trial of daily antihistamine like Zyrtec, Claritin, Allegra.  Patient states that she will purchase this over-the-counter, follow-up in about 6 weeks to see if this is effective.  If not, consider referral to ENT or allergy.

## 2022-12-19 NOTE — Assessment & Plan Note (Signed)
Most recent A1c 5.9, repeating today.  Monitor sugar and carbohydrate intake, get routine physical activity.  Will continue to monitor.

## 2022-12-19 NOTE — Assessment & Plan Note (Signed)
Tolerating increased rosuvastatin dose well.  Repeat lipid panel and CMP for hepatic functioning.  Continue rosuvastatin 10 mg daily unless lab results warrant change.

## 2022-12-20 LAB — LIPID PANEL
Chol/HDL Ratio: 2.9 ratio (ref 0.0–4.4)
Cholesterol, Total: 165 mg/dL (ref 100–199)
HDL: 56 mg/dL (ref >39)
LDL Chol Calc (NIH): 80 mg/dL (ref 0–99)
Triglycerides: 173 mg/dL — ABNORMAL HIGH (ref 0–149)
VLDL Cholesterol Cal: 29 mg/dL (ref 5–40)

## 2022-12-20 LAB — CBC WITH DIFFERENTIAL/PLATELET
Basophils Absolute: 0.1 10*3/uL (ref 0.0–0.2)
Basos: 1 %
EOS (ABSOLUTE): 0.1 10*3/uL (ref 0.0–0.4)
Eos: 2 %
Hematocrit: 43.7 % (ref 34.0–46.6)
Hemoglobin: 15 g/dL (ref 11.1–15.9)
Immature Grans (Abs): 0 10*3/uL (ref 0.0–0.1)
Immature Granulocytes: 0 %
Lymphocytes Absolute: 2.6 10*3/uL (ref 0.7–3.1)
Lymphs: 38 %
MCH: 31.4 pg (ref 26.6–33.0)
MCHC: 34.3 g/dL (ref 31.5–35.7)
MCV: 92 fL (ref 79–97)
Monocytes Absolute: 0.5 10*3/uL (ref 0.1–0.9)
Monocytes: 8 %
Neutrophils Absolute: 3.5 10*3/uL (ref 1.4–7.0)
Neutrophils: 51 %
Platelets: 258 10*3/uL (ref 150–450)
RBC: 4.77 x10E6/uL (ref 3.77–5.28)
RDW: 12.6 % (ref 11.7–15.4)
WBC: 6.8 10*3/uL (ref 3.4–10.8)

## 2022-12-20 LAB — COMPREHENSIVE METABOLIC PANEL
ALT: 32 [IU]/L (ref 0–32)
AST: 31 [IU]/L (ref 0–40)
Albumin: 4.7 g/dL (ref 3.9–4.9)
Alkaline Phosphatase: 68 [IU]/L (ref 44–121)
BUN/Creatinine Ratio: 20 (ref 12–28)
BUN: 20 mg/dL (ref 8–27)
Bilirubin Total: 0.6 mg/dL (ref 0.0–1.2)
CO2: 24 mmol/L (ref 20–29)
Calcium: 9.9 mg/dL (ref 8.7–10.3)
Chloride: 101 mmol/L (ref 96–106)
Creatinine, Ser: 0.99 mg/dL (ref 0.57–1.00)
Globulin, Total: 2.3 g/dL (ref 1.5–4.5)
Glucose: 102 mg/dL — ABNORMAL HIGH (ref 70–99)
Potassium: 4.6 mmol/L (ref 3.5–5.2)
Sodium: 141 mmol/L (ref 134–144)
Total Protein: 7 g/dL (ref 6.0–8.5)
eGFR: 62 mL/min/{1.73_m2} (ref >59)

## 2022-12-20 LAB — VITAMIN B12: Vitamin B-12: 707 pg/mL (ref 232–1245)

## 2022-12-20 LAB — VITAMIN D 25 HYDROXY (VIT D DEFICIENCY, FRACTURES): Vit D, 25-Hydroxy: 68.9 ng/mL (ref 30.0–100.0)

## 2022-12-20 LAB — TSH RFX ON ABNORMAL TO FREE T4: TSH: 2.28 u[IU]/mL (ref 0.450–4.500)

## 2022-12-20 LAB — HEMOGLOBIN A1C
Est. average glucose Bld gHb Est-mCnc: 123 mg/dL
Hgb A1c MFr Bld: 5.9 % — ABNORMAL HIGH (ref 4.8–5.6)

## 2023-01-31 ENCOUNTER — Encounter: Payer: Medicare Other | Admitting: Family Medicine

## 2023-02-01 ENCOUNTER — Ambulatory Visit: Payer: Medicare Other

## 2023-02-01 DIAGNOSIS — Z0000 Encounter for general adult medical examination without abnormal findings: Secondary | ICD-10-CM | POA: Diagnosis not present

## 2023-02-01 NOTE — Progress Notes (Signed)
Subjective:   JENICA SZYMKOWIAK is a 72 y.o. female who presents for Medicare Annual (Subsequent) preventive examination.  Visit Complete: Virtual I connected with  Carlinda A Juliana on 02/01/23 by a audio enabled telemedicine application and verified that I am speaking with the correct person using two identifiers.  Patient Location: Home  Provider Location: Office/Clinic  I discussed the limitations of evaluation and management by telemedicine. The patient expressed understanding and agreed to proceed.  Vital Signs: Because this visit was a virtual/telehealth visit, some criteria may be missing or patient reported. Any vitals not documented were not able to be obtained and vitals that have been documented are patient reported.    Cardiac Risk Factors include: advanced age (>92men, >41 women);dyslipidemia;hypertension     Objective:    Today's Vitals   02/01/23 1226  PainSc: 3    There is no height or weight on file to calculate BMI.     02/01/2023   12:34 PM 10/14/2019   12:48 PM 10/17/2017    2:55 PM 01/15/2016   11:00 AM 01/13/2016    2:00 AM 01/05/2016    5:47 PM  Advanced Directives  Does Patient Have a Medical Advance Directive? No No No  No No  Would patient like information on creating a medical advance directive?  No - Patient declined Yes (MAU/Ambulatory/Procedural Areas - Information given) No - Patient declined      Current Medications (verified) Outpatient Encounter Medications as of 02/01/2023  Medication Sig   b complex vitamins tablet Take 1 tablet by mouth daily.   Blood Pressure Monitoring (3 SERIES BP MONITOR/WRIST) DEVI Blood pressure monitoring at home 1 to 2 times daily prn   Blood Pressure Monitoring (BLOOD PRESSURE MONITOR/M CUFF) MISC 1 each by Does not apply route 2 (two) times daily as needed.   calcium carbonate (TUMS - DOSED IN MG ELEMENTAL CALCIUM) 500 MG chewable tablet Chew 1 tablet by mouth daily.   Cholecalciferol (D3 ADULT PO) Take 1 tablet by  mouth daily.   co-enzyme Q-10 30 MG capsule Take 200 mg by mouth daily.   hydrochlorothiazide (MICROZIDE) 12.5 MG capsule Take 1 capsule (12.5 mg total) by mouth daily.   KRILL OIL PO Take 1 tablet by mouth daily.   losartan (COZAAR) 25 MG tablet Take 1 tablet (25 mg total) by mouth daily.   magnesium gluconate (MAGONATE) 500 MG tablet Take 500 mg by mouth daily.   rosuvastatin (CRESTOR) 10 MG tablet Take 1 tablet (10 mg total) by mouth daily.   No facility-administered encounter medications on file as of 02/01/2023.    Allergies (verified) Patient has no known allergies.   History: Past Medical History:  Diagnosis Date   Chicken pox    Colon polyp    Hyperlipidemia    Hypertension    UTI (urinary tract infection)    Past Surgical History:  Procedure Laterality Date   APPENDECTOMY     COLONOSCOPY  09/03/2009   hyperplastic polyps   LAPAROSCOPIC APPENDECTOMY N/A 01/15/2016   Procedure: APPENDECTOMY LAPAROSCOPIC. DRAINAGE OF INTRA ABDOMINAL ABSCESSES X 3, LYSIS OF ADHESIONS;  Surgeon: Karie Soda, MD;  Location: [facility];  Service: General;  Laterality: N/A;   Family History  Problem Relation Age of Onset   Heart disease Mother 44       CAD   Hypertension Mother    Hyperlipidemia Mother    Heart disease Father 14       MI   COPD Father    Hypertension  Sister    Cancer Maternal Uncle        colon, pancreatic   Alcoholism Paternal Uncle    Stroke Maternal Grandmother    Cancer Maternal Grandfather        colon, pancreatic   Colon cancer Neg Hx    Colon polyps Neg Hx    Esophageal cancer Neg Hx    Rectal cancer Neg Hx    Stomach cancer Neg Hx    Breast cancer Neg Hx    Social History   Socioeconomic History   Marital status: Married    Spouse name: Not on file   Number of children: Not on file   Years of education: Not on file   Highest education level: Not on file  Occupational History   Not on file  Tobacco Use   Smoking status: Never    Passive exposure:  Never   Smokeless tobacco: Never  Vaping Use   Vaping status: Never Used  Substance and Sexual Activity   Alcohol use: Yes    Comment: a glass of wine with dinner    Drug use: No   Sexual activity: Yes    Birth control/protection: None  Other Topics Concern   Not on file  Social History Narrative   Work or School: works as Naval architect, office work      Home Situation: lives with husband and handicapped adult son      Spiritual Beliefs: Christian      Lifestyle: going to the gym 3-4 days per week, working on diet - portion sizes are an issue            Social Drivers of Corporate investment banker Strain: Low Risk  (02/01/2023)   Overall Financial Resource Strain (CARDIA)    Difficulty of Paying Living Expenses: Not hard at all  Food Insecurity: No Food Insecurity (02/01/2023)   Hunger Vital Sign    Worried About Running Out of Food in the Last Year: Never true    Ran Out of Food in the Last Year: Never true  Transportation Needs: No Transportation Needs (02/01/2023)   PRAPARE - Administrator, Civil Service (Medical): No    Lack of Transportation (Non-Medical): No  Physical Activity: Inactive (02/01/2023)   Exercise Vital Sign    Days of Exercise per Week: 0 days    Minutes of Exercise per Session: 0 min  Stress: No Stress Concern Present (02/01/2023)   [employer] of Occupational Health - Occupational Stress Questionnaire    Feeling of Stress : Not at all  Social Connections: Moderately Isolated (02/01/2023)   Social Connection and Isolation Panel [NHANES]    Frequency of Communication with Friends and Family: More than three times a week    Frequency of Social Gatherings with Friends and Family: Not on file    Attends Religious Services: Never    Database administrator or Organizations: No    Attends Engineer, structural: Never    Marital Status: Married    Tobacco Counseling Counseling given: Not Answered   Clinical  Intake:  Pre-visit preparation completed: Yes  Pain : 0-10 Pain Score: 3  Pain Type: Acute pain Pain Location: Knee Pain Orientation: Right Pain Descriptors / Indicators: Aching Pain Onset: 1 to 4 weeks ago Pain Frequency: Constant     Nutritional Risks: None Diabetes: No  How often do you need to have someone help you when you read instructions, pamphlets, or other written materials from your  doctor or pharmacy?: 1 - Never  Interpreter Needed?: No  Information entered by :: NAllen LPN   Activities of Daily Living    02/01/2023   12:28 PM  In your present state of health, do you have any difficulty performing the following activities:  Hearing? 1  Comment tv a little louder  Vision? 0  Difficulty concentrating or making decisions? 0  Walking or climbing stairs? 1  Comment due to sore knees  Dressing or bathing? 0  Doing errands, shopping? 0  Preparing Food and eating ? N  Using the Toilet? N  In the past six months, have you accidently leaked urine? N  Do you have problems with loss of bowel control? N  Managing your Medications? N  Managing your Finances? N  Housekeeping or managing your Housekeeping? N    Patient Care Team: Melida Quitter, PA as PCP - General (Family Medicine)  Indicate any recent Medical Services you may have received from other than [facility] providers in the past year (date may be approximate).     Assessment:   This is a routine wellness examination for Marceil.  Hearing/Vision screen Hearing Screening - Comments:: Denies hearing issues Vision Screening - Comments:: No regular eye exams, Dr. Sondra Come   Goals Addressed             This Visit's Progress    Patient Stated       02/01/2023, wants to lose weight       Depression Screen    02/01/2023   12:36 PM 12/19/2022    9:46 AM 05/31/2022    8:59 AM 01/25/2022    1:19 PM 09/13/2021   10:43 AM 01/25/2021    2:03 PM 06/02/2020    1:43 PM  PHQ 2/9 Scores  PHQ - 2 Score 0 0 0 0 0  0 0  PHQ- 9 Score 2 7 6 4 4 2 8     Fall Risk    02/01/2023   12:35 PM 05/31/2022    9:00 AM 01/25/2022    1:23 PM 01/25/2021    2:02 PM 06/02/2020    1:42 PM  Fall Risk   Falls in the past year? 0 0 0 0 1  Number falls in past yr: 0 0 0 0 0  Injury with Fall? 0 0 0 0 0  Risk for fall due to : Medication side effect No Fall Risks  Impaired balance/gait   Follow up Falls prevention discussed;Falls evaluation completed   Falls evaluation completed Falls evaluation completed    MEDICARE RISK AT HOME: Medicare Risk at Home Any stairs in or around the home?: Yes If so, are there any without handrails?: No Home free of loose throw rugs in walkways, pet beds, electrical cords, etc?: Yes Adequate lighting in your home to reduce risk of falls?: Yes Life alert?: No Use of a cane, walker or w/c?: No Grab bars in the bathroom?: No Shower chair or bench in shower?: Yes Elevated toilet seat or a handicapped toilet?: No  TIMED UP AND GO:  Was the test performed?  No    Cognitive Function:        02/01/2023   12:38 PM 01/25/2022    1:18 PM 06/02/2020    1:46 PM  6CIT Screen  What Year? 0 points 0 points 0 points  What month? 0 points 0 points 0 points  What time? 0 points 0 points 0 points  Count back from 20 0 points 0 points 0 points  Months in reverse 0 points 0 points 0 points  Repeat phrase 0 points 0 points 0 points  Total Score 0 points 0 points 0 points    Immunizations Immunization History  Administered Date(s) Administered   Influenza Whole 01/12/2009, 01/05/2010   Influenza,inj,Quad PF,6+ Mos 01/08/2015, 12/23/2015, 01/22/2018   PFIZER(Purple Top)SARS-COV-2 Vaccination 04/14/2019, 05/06/2019   Td 01/12/2009   Tdap 01/22/2018   Zoster, Live 06/05/2014    TDAP status: Up to date  Flu Vaccine status: Declined, Education has been provided regarding the importance of this vaccine but patient still declined. Advised may receive this vaccine at local pharmacy or Health  Dept. Aware to provide a copy of the vaccination record if obtained from local pharmacy or Health Dept. Verbalized acceptance and understanding.  Pneumococcal vaccine status: Declined,  Education has been provided regarding the importance of this vaccine but patient still declined. Advised may receive this vaccine at local pharmacy or Health Dept. Aware to provide a copy of the vaccination record if obtained from local pharmacy or Health Dept. Verbalized acceptance and understanding.   Covid-19 vaccine status: Information provided on how to obtain vaccines.   Qualifies for Shingles Vaccine? Yes   Zostavax completed Yes   Shingrix Completed?: No.    Education has been provided regarding the importance of this vaccine. Patient has been advised to call insurance company to determine out of pocket expense if they have not yet received this vaccine. Advised may also receive vaccine at local pharmacy or Health Dept. Verbalized acceptance and understanding.  Screening Tests Health Maintenance  Topic Date Due   Zoster Vaccines- Shingrix (1 of 2) 01/29/2003   COVID-19 Vaccine (3 - 2024-25 season) 02/17/2023 (Originally 10/22/2022)   INFLUENZA VACCINE  05/21/2023 (Originally 09/21/2022)   Pneumonia Vaccine 30+ Years old (1 of 1 - PCV) 02/01/2024 (Originally 01/28/2018)   MAMMOGRAM  08/03/2023   Medicare Annual Wellness (AWV)  02/01/2024   Colonoscopy  10/14/2026   DTaP/Tdap/Td (3 - Td or Tdap) 01/23/2028   DEXA SCAN  Completed   Hepatitis C Screening  Completed   HPV VACCINES  Aged Out    Health Maintenance  Health Maintenance Due  Topic Date Due   Zoster Vaccines- Shingrix (1 of 2) 01/29/2003    Colorectal cancer screening: Type of screening: Colonoscopy. Completed 10/14/2019. Repeat every 7 years  Mammogram status: Completed 08/02/2021. Repeat every year  Bone Density status: Completed 06/20/2019.   Lung Cancer Screening: (Low Dose CT Chest recommended if Age 57-80 years, 20 pack-year  currently smoking OR have quit w/in 15years.) does not qualify.   Lung Cancer Screening Referral: no  Additional Screening:  Hepatitis C Screening: does qualify; Completed 04/28/2016  Vision Screening: Recommended annual ophthalmology exams for early detection of glaucoma and other disorders of the eye. Is the patient up to date with their annual eye exam?  No  Who is the provider or what is the name of the office in which the patient attends annual eye exams? Dr. Sondra Come If pt is not established with a provider, would they like to be referred to a provider to establish care? No .   Dental Screening: Recommended annual dental exams for proper oral hygiene  Diabetic Foot Exam: n/a  Community Resource Referral / Chronic Care Management: CRR required this visit?  No   CCM required this visit?  No     Plan:     I have personally reviewed and noted the following in the patient's chart:   Medical and social history  Use of alcohol, tobacco or illicit drugs  Current medications and supplements including opioid prescriptions. Patient is not currently taking opioid prescriptions. Functional ability and status Nutritional status Physical activity Advanced directives List of other physicians Hospitalizations, surgeries, and ER visits in previous 12 months Vitals Screenings to include cognitive, depression, and falls Referrals and appointments  In addition, I have reviewed and discussed with patient certain preventive protocols, quality metrics, and best practice recommendations. A written personalized care plan for preventive services as well as general preventive health recommendations were provided to patient.     Barb Merino, LPN   [id number]   After Visit Summary: (Pick Up) Due to this being a telephonic visit, with patients personalized plan was offered to patient and patient has requested to Pick up at office.  Nurse Notes: none

## 2023-02-01 NOTE — Patient Instructions (Signed)
Dana Hodge , Thank you for taking time to come for your Medicare Wellness Visit. I appreciate your ongoing commitment to your health goals. Please review the following plan we discussed and let me know if I can assist you in the future.   Referrals/Orders/Follow-Ups/Clinician Recommendations: none  This is a list of the screening recommended for you and due dates:  Health Maintenance  Topic Date Due   Zoster (Shingles) Vaccine (1 of 2) 01/29/2003   COVID-19 Vaccine (3 - 2024-25 season) 02/17/2023*   Flu Shot  05/21/2023*   Pneumonia Vaccine (1 of 1 - PCV) 02/01/2024*   Mammogram  08/03/2023   Medicare Annual Wellness Visit  02/01/2024   Colon Cancer Screening  10/14/2026   DTaP/Tdap/Td vaccine (3 - Td or Tdap) 01/23/2028   DEXA scan (bone density measurement)  Completed   Hepatitis C Screening  Completed   HPV Vaccine  Aged Out  *Topic was postponed. The date shown is not the original due date.    Advanced directives: (ACP Link)Information on Advanced Care Planning can be found at [facility] Advance Health Care Directives Advance Health Care Directives ([email or web address]   Next Medicare Annual Wellness Visit scheduled for next year: Yes  Insert Preventive Care attachment Insert FALL PREVENTION attachment if needed

## 2023-02-05 ENCOUNTER — Ambulatory Visit: Payer: Medicare Other | Admitting: Family Medicine

## 2023-03-29 ENCOUNTER — Encounter: Payer: Self-pay | Admitting: Family Medicine

## 2023-05-04 ENCOUNTER — Other Ambulatory Visit: Payer: Self-pay | Admitting: Family Medicine

## 2023-05-04 DIAGNOSIS — I10 Essential (primary) hypertension: Secondary | ICD-10-CM

## 2023-05-19 ENCOUNTER — Other Ambulatory Visit: Payer: Self-pay | Admitting: Family Medicine

## 2023-05-19 DIAGNOSIS — I10 Essential (primary) hypertension: Secondary | ICD-10-CM

## 2023-05-30 DIAGNOSIS — K080 Exfoliation of teeth due to systemic causes: Secondary | ICD-10-CM | POA: Diagnosis not present

## 2023-07-13 ENCOUNTER — Other Ambulatory Visit: Payer: Self-pay | Admitting: Family Medicine

## 2023-07-13 DIAGNOSIS — E782 Mixed hyperlipidemia: Secondary | ICD-10-CM

## 2023-07-29 ENCOUNTER — Other Ambulatory Visit: Payer: Self-pay | Admitting: Family Medicine

## 2023-07-29 DIAGNOSIS — I10 Essential (primary) hypertension: Secondary | ICD-10-CM

## 2023-08-21 ENCOUNTER — Other Ambulatory Visit: Payer: Self-pay | Admitting: Family Medicine

## 2023-08-21 DIAGNOSIS — I10 Essential (primary) hypertension: Secondary | ICD-10-CM

## 2023-10-13 ENCOUNTER — Other Ambulatory Visit: Payer: Self-pay | Admitting: Family Medicine

## 2023-10-13 DIAGNOSIS — E782 Mixed hyperlipidemia: Secondary | ICD-10-CM

## 2023-10-25 ENCOUNTER — Other Ambulatory Visit: Payer: Self-pay

## 2023-10-25 DIAGNOSIS — I10 Essential (primary) hypertension: Secondary | ICD-10-CM

## 2023-11-20 ENCOUNTER — Other Ambulatory Visit: Payer: Self-pay

## 2023-11-20 DIAGNOSIS — I10 Essential (primary) hypertension: Secondary | ICD-10-CM

## 2023-12-03 ENCOUNTER — Other Ambulatory Visit: Payer: Self-pay | Admitting: Family Medicine

## 2023-12-03 DIAGNOSIS — E781 Pure hyperglyceridemia: Secondary | ICD-10-CM

## 2023-12-03 DIAGNOSIS — E559 Vitamin D deficiency, unspecified: Secondary | ICD-10-CM

## 2023-12-03 DIAGNOSIS — E663 Overweight: Secondary | ICD-10-CM

## 2023-12-03 DIAGNOSIS — R7303 Prediabetes: Secondary | ICD-10-CM

## 2023-12-03 DIAGNOSIS — I10 Essential (primary) hypertension: Secondary | ICD-10-CM

## 2023-12-03 DIAGNOSIS — E782 Mixed hyperlipidemia: Secondary | ICD-10-CM

## 2023-12-03 DIAGNOSIS — R7989 Other specified abnormal findings of blood chemistry: Secondary | ICD-10-CM

## 2023-12-04 ENCOUNTER — Other Ambulatory Visit

## 2023-12-04 DIAGNOSIS — E782 Mixed hyperlipidemia: Secondary | ICD-10-CM | POA: Diagnosis not present

## 2023-12-04 DIAGNOSIS — I10 Essential (primary) hypertension: Secondary | ICD-10-CM | POA: Diagnosis not present

## 2023-12-04 DIAGNOSIS — E781 Pure hyperglyceridemia: Secondary | ICD-10-CM

## 2023-12-04 DIAGNOSIS — R7989 Other specified abnormal findings of blood chemistry: Secondary | ICD-10-CM

## 2023-12-04 DIAGNOSIS — E663 Overweight: Secondary | ICD-10-CM

## 2023-12-04 DIAGNOSIS — R7303 Prediabetes: Secondary | ICD-10-CM

## 2023-12-04 DIAGNOSIS — E559 Vitamin D deficiency, unspecified: Secondary | ICD-10-CM | POA: Diagnosis not present

## 2023-12-05 ENCOUNTER — Ambulatory Visit: Payer: Self-pay | Admitting: Family Medicine

## 2023-12-05 LAB — VITAMIN D 25 HYDROXY (VIT D DEFICIENCY, FRACTURES): Vit D, 25-Hydroxy: 49.1 ng/mL (ref 30.0–100.0)

## 2023-12-05 LAB — COMPREHENSIVE METABOLIC PANEL WITH GFR
ALT: 22 IU/L (ref 0–32)
AST: 20 IU/L (ref 0–40)
Albumin: 4.5 g/dL (ref 3.9–4.9)
Alkaline Phosphatase: 64 IU/L (ref 49–135)
BUN/Creatinine Ratio: 15 (ref 12–28)
BUN: 16 mg/dL (ref 8–27)
Bilirubin Total: 0.7 mg/dL (ref 0.0–1.2)
CO2: 25 mmol/L (ref 20–29)
Calcium: 9.6 mg/dL (ref 8.7–10.3)
Chloride: 103 mmol/L (ref 96–106)
Creatinine, Ser: 1.06 mg/dL — ABNORMAL HIGH (ref 0.57–1.00)
Globulin, Total: 2.1 g/dL (ref 1.5–4.5)
Glucose: 93 mg/dL (ref 70–99)
Potassium: 4 mmol/L (ref 3.5–5.2)
Sodium: 140 mmol/L (ref 134–144)
Total Protein: 6.6 g/dL (ref 6.0–8.5)
eGFR: 57 mL/min/1.73 — ABNORMAL LOW (ref >59)

## 2023-12-05 LAB — CBC WITH DIFFERENTIAL/PLATELET
Basophils Absolute: 0.1 x10E3/uL (ref 0.0–0.2)
Basos: 1 %
EOS (ABSOLUTE): 0.2 x10E3/uL (ref 0.0–0.4)
Eos: 3 %
Hematocrit: 43.1 % (ref 34.0–46.6)
Hemoglobin: 13.7 g/dL (ref 11.1–15.9)
Immature Grans (Abs): 0 x10E3/uL (ref 0.0–0.1)
Immature Granulocytes: 0 %
Lymphocytes Absolute: 2.8 x10E3/uL (ref 0.7–3.1)
Lymphs: 44 %
MCH: 29.1 pg (ref 26.6–33.0)
MCHC: 31.8 g/dL (ref 31.5–35.7)
MCV: 92 fL (ref 79–97)
Monocytes Absolute: 0.4 x10E3/uL (ref 0.1–0.9)
Monocytes: 7 %
Neutrophils Absolute: 2.8 x10E3/uL (ref 1.4–7.0)
Neutrophils: 45 %
Platelets: 259 x10E3/uL (ref 150–450)
RBC: 4.71 x10E6/uL (ref 3.77–5.28)
RDW: 12.7 % (ref 11.7–15.4)
WBC: 6.2 x10E3/uL (ref 3.4–10.8)

## 2023-12-05 LAB — LIPID PANEL
Chol/HDL Ratio: 3 ratio (ref 0.0–4.4)
Cholesterol, Total: 160 mg/dL (ref 100–199)
HDL: 54 mg/dL (ref >39)
LDL Chol Calc (NIH): 80 mg/dL (ref 0–99)
Triglycerides: 149 mg/dL (ref 0–149)
VLDL Cholesterol Cal: 26 mg/dL (ref 5–40)

## 2023-12-05 LAB — HEMOGLOBIN A1C
Est. average glucose Bld gHb Est-mCnc: 120 mg/dL
Hgb A1c MFr Bld: 5.8 % — ABNORMAL HIGH (ref 4.8–5.6)

## 2023-12-10 ENCOUNTER — Encounter: Payer: Self-pay | Admitting: Family Medicine

## 2023-12-11 ENCOUNTER — Encounter: Payer: Self-pay | Admitting: Family Medicine

## 2023-12-11 ENCOUNTER — Ambulatory Visit (INDEPENDENT_AMBULATORY_CARE_PROVIDER_SITE_OTHER): Admitting: Family Medicine

## 2023-12-11 VITALS — BP 120/85 | HR 79 | Ht 64.96 in | Wt 165.4 lb

## 2023-12-11 DIAGNOSIS — Z0000 Encounter for general adult medical examination without abnormal findings: Secondary | ICD-10-CM | POA: Diagnosis not present

## 2023-12-11 DIAGNOSIS — E782 Mixed hyperlipidemia: Secondary | ICD-10-CM

## 2023-12-11 DIAGNOSIS — Z800 Family history of malignant neoplasm of digestive organs: Secondary | ICD-10-CM | POA: Insufficient documentation

## 2023-12-11 DIAGNOSIS — Z1231 Encounter for screening mammogram for malignant neoplasm of breast: Secondary | ICD-10-CM

## 2023-12-11 DIAGNOSIS — H9193 Unspecified hearing loss, bilateral: Secondary | ICD-10-CM | POA: Insufficient documentation

## 2023-12-11 DIAGNOSIS — R4189 Other symptoms and signs involving cognitive functions and awareness: Secondary | ICD-10-CM | POA: Diagnosis not present

## 2023-12-11 DIAGNOSIS — L299 Pruritus, unspecified: Secondary | ICD-10-CM

## 2023-12-11 DIAGNOSIS — I10 Essential (primary) hypertension: Secondary | ICD-10-CM

## 2023-12-11 MED ORDER — FLUOCINOLONE ACETONIDE 0.01 % OT OIL: 5.0000 [drp] | TOPICAL_OIL | Freq: Two times a day (BID) | OTIC | 1 refills | Status: AC

## 2023-12-11 MED ORDER — ROSUVASTATIN CALCIUM 10 MG PO TABS: 10.0000 mg | ORAL_TABLET | Freq: Every day | ORAL | 0 refills | Status: DC

## 2023-12-11 MED ORDER — LOSARTAN POTASSIUM 25 MG PO TABS: 25.0000 mg | ORAL_TABLET | Freq: Every day | ORAL | 0 refills | Status: DC

## 2023-12-11 NOTE — Progress Notes (Signed)
 Annual physical  Subjective    Patient ID: Dana Hodge, female    DOB: [birth year]  Age: 73 y.o. MRN: [id number]  Chief Complaint  Patient presents with   Annual Exam   HPI Dana Hodge is a 73 y.o. old female here  for annual exam.   Subjective - Annual physical examination. Primary goal is to discontinue all medications if possible. - Reports a constant runny nose with clear discharge, which is suspected to be a side effect of a blood pressure medication. This has been present for years. - Reports brain fog and difficulty concentrating for the past couple of years. Experiences word-finding difficulties and pauses when speaking, which family members have noted. - Reports decline in hearing. Requires higher volume on television than previously. - Reports chronic itching in the ear canals for approximately seven years. - Reports swollen, stiff fingers in the morning, attributed to high salt intake.  Medications Prescriptions include hydrochlorothiazide  daily, losartan  25 mg daily, and Crestor  daily. Reports taking these for approximately seven years. Supplements include magnesium  nightly for constipation, vitamin D  2000 mg nightly, and intermittent krill oil, CoQ10, and B-complex vitamins. Previously used Tums for upset stomach but has discontinued.  PMH, PSH, FH, Social Hx PMHx: Hypertension, hyperlipidemia (LDL once 208), prediabetes (stable A1C in prediabetic range for 4 years), history of low vitamin D , history of hypercalcemia. No history of MI or CVA. Last colonoscopy normal, due again in 12/25/26. FH: Father died of MI at age 44. Mother had a triple bypass in her 82s, was on a statin, and died at 9 from lung cancer (diagnosed at 58 with nodules, possibly metastatic from a prior breast cancer). Paternal grandfather died of colon cancer. Maternal uncle died of bladder cancer. Another maternal uncle is 100 and a survivor of pancreatic cancer. A cousin (~67 years old) was recently diagnosed with  breast cancer requiring double mastectomy. Social Hx: Reports recent weight loss from 179 lbs following a low-carbohydrate diet and cessation of wine. Notes that alcohol consumption halted weight loss.  ROS Constitutional: Denies fever, chills. Reports brain fog. CV: Denies chest pain or shortness of breath with exertion. Resp: Reports chronic rhinorrhea. Denies dyspnea. Can ascend a flight of stairs without stopping. HEENT: Reports decreased hearing and chronic pruritus of the ear canals. MSK: Reports morning stiffness and swelling in fingers.   The 10-year ASCVD risk score (Arnett DK, et al., 12-24-17) is: 10.5%  Health Maintenance Due  Topic Date Due   Zoster Vaccines- Shingrix (1 of 2) 01/29/2003   Mammogram  08/03/2023   COVID-19 Vaccine (3 - 2025-26 season) 10/22/2023   Medicare Annual Wellness (AWV)  02/01/2024      Objective:     BP 120/85   Pulse 79   Ht 5' 4.96 (1.65 m)   Wt 165 lb 6.4 oz (75 kg)   SpO2 97%   BMI 27.56 kg/m    Physical Exam Gen: alert, oriented HEENT: perrla, eomi, mmm. No cerumen noted in the ear canals b/l.  Normal TM b/l CV: rrr, no murmur Pulm: lctab. No wheeze or crackles.  GI: soft, nbs.  Nontender to palpation MSK: strength equal b/l. Normal gait Ext: no pedal edema Skin: warm and dry, no rashes Psych: pleasant affect.  Spontaneous speech   No results found for any visits on 12/11/23.      Assessment & Plan:   Physical exam, annual  Essential hypertension Assessment & Plan: - Currently treated with losartan  25 mg and hydrochlorothiazide . Reports chronic rhinorrhea, possibly  related to HCTZ. Blood pressure has been variable at home but was 120/85 in office. Desires to reduce medication burden. - Discontinue hydrochlorothiazide . - Start home blood pressure monitoring twice daily for two weeks. - Continue losartan  25 mg daily. - Follow up in one month with blood pressure log. Plan to adjust losartan  dose if needed based on  readings. Goal is SBP <130 on average.  Orders: -     Losartan  Potassium; Take 1 tablet (25 mg total) by mouth daily.  Dispense: 90 tablet; Refill: 0  Mixed hyperlipidemia Assessment & Plan: - History of significantly elevated LDL (208), suggestive of familial hyperlipidemia. Family history is significant for premature cardiovascular disease. LDL is currently at goal (80) on Crestor . Discussed the strong recommendation to continue statin therapy due to high baseline cardiovascular risk. - Order ApoB and Lp(a) to further risk stratify. - Schedule follow-up in one month to review results.  Orders: -     Rosuvastatin  Calcium ; Take 1 tablet (10 mg total) by mouth daily.  Dispense: 90 tablet; Refill: 0  Family history of pancreatic cancer Assessment & Plan: - Extensive family history of various cancers (breast, lung, colon, bladder, pancreatic). Expressed concern about genetic predisposition. - Refer to a genetic counselor to discuss risks and appropriate genetic testing options (e.g., BRCA). - Reassured that screening is up to date (mammogram ordered, colonoscopy not due until 2028).  Orders: -     Ambulatory referral to Genetics  Hearing difficulty of both ears Assessment & Plan: - Reports progressive hearing decline. - Refer to Audiology for a formal hearing evaluation.  Orders: -     Ambulatory referral to Audiology  Encounter for screening mammogram for malignant neoplasm of breast -     3D Screening Mammogram, Left and Right; Future  Healthcare maintenance Assessment & Plan: - Labs reviewed. Vitamin D  level is 49 (normal). A1C is in prediabetic range but stable. Kidney function is stable. - Order mammogram.   Brain fog Assessment & Plan: - Reports subjective brain fog and word-finding difficulty for several years. - Schedule a dedicated appointment for cognitive testing (e.g., MoCA) to establish a baseline. - Discussed referral for formal neuropsychiatric testing if  indicated.   Ear itching b/l  Assessment & Plan: - Chronic itching of bilateral ear canals for ~7 years. Unresponsive to over-the-counter hydrocortisone and acetic acid . Exam shows clean canals. Likely diagnosis is eczematous otitis externa. - Prescribe fluocinolone otic solution.   Other orders -     Fluocinolone Acetonide; Place 5 drops in ear(s) in the morning and at bedtime for 14 days.  Dispense: 20 mL; Refill: 1     Return in about 4 weeks (around 01/08/2024) for htn, cognitive testing, hld.    Toribio MARLA Slain, MD

## 2023-12-11 NOTE — Assessment & Plan Note (Signed)
-   Reports progressive hearing decline. - Refer to Audiology for a formal hearing evaluation.

## 2023-12-11 NOTE — Assessment & Plan Note (Signed)
-   Labs reviewed. Vitamin D  level is 49 (normal). A1C is in prediabetic range but stable. Kidney function is stable. - Order mammogram.

## 2023-12-11 NOTE — Patient Instructions (Signed)
 It was nice to see you today,  We addressed the following topics today: - I would like you to stop taking the hydrochlorothiazide . - Please continue taking your losartan  and Crestor  as prescribed. - Please check your blood pressure at home twice a day for the next two weeks. Once within an hour of waking up, and once in the evening. Please write the readings down on the log I gave you and bring it to your next appointment. - I have sent a prescription for new ear drops to your pharmacy. Please use them as directed for the itching in your ears. - I have ordered a mammogram and additional cholesterol blood tests (ApoB, LpA). Please go to the lab to have your blood drawn. - I have sent a referral for you to see an audiologist for a hearing test. - We have scheduled a follow-up appointment in one month to review your blood pressure log, lab results, and to perform cognitive testing.  Have a great day,  Rolan Slain, MD

## 2023-12-11 NOTE — Assessment & Plan Note (Signed)
-   Chronic itching of bilateral ear canals for ~7 years. Unresponsive to over-the-counter hydrocortisone and acetic acid . Exam shows clean canals. Likely diagnosis is eczematous otitis externa. - Prescribe fluocinolone otic solution.

## 2023-12-11 NOTE — Assessment & Plan Note (Signed)
-   Extensive family history of various cancers (breast, lung, colon, bladder, pancreatic). Expressed concern about genetic predisposition. - Refer to a genetic counselor to discuss risks and appropriate genetic testing options (e.g., BRCA). - Reassured that screening is up to date (mammogram ordered, colonoscopy not due until 2028).

## 2023-12-11 NOTE — Assessment & Plan Note (Signed)
-   Currently treated with losartan  25 mg and hydrochlorothiazide . Reports chronic rhinorrhea, possibly related to HCTZ. Blood pressure has been variable at home but was 120/85 in office. Desires to reduce medication burden. - Discontinue hydrochlorothiazide . - Start home blood pressure monitoring twice daily for two weeks. - Continue losartan  25 mg daily. - Follow up in one month with blood pressure log. Plan to adjust losartan  dose if needed based on readings. Goal is SBP <130 on average.

## 2023-12-11 NOTE — Assessment & Plan Note (Signed)
-   History of significantly elevated LDL (208), suggestive of familial hyperlipidemia. Family history is significant for premature cardiovascular disease. LDL is currently at goal (80) on Crestor . Discussed the strong recommendation to continue statin therapy due to high baseline cardiovascular risk. - Order ApoB and Lp(a) to further risk stratify. - Schedule follow-up in one month to review results.

## 2023-12-11 NOTE — Assessment & Plan Note (Signed)
-   Reports subjective brain fog and word-finding difficulty for several years. - Schedule a dedicated appointment for cognitive testing (e.g., MoCA) to establish a baseline. - Discussed referral for formal neuropsychiatric testing if indicated.

## 2023-12-12 ENCOUNTER — Inpatient Hospital Stay: Admission: RE | Admit: 2023-12-12 | Discharge: 2023-12-12 | Attending: Family Medicine

## 2023-12-12 DIAGNOSIS — Z1231 Encounter for screening mammogram for malignant neoplasm of breast: Secondary | ICD-10-CM

## 2023-12-19 ENCOUNTER — Ambulatory Visit: Payer: Self-pay

## 2023-12-19 DIAGNOSIS — K080 Exfoliation of teeth due to systemic causes: Secondary | ICD-10-CM | POA: Diagnosis not present

## 2023-12-19 NOTE — Telephone Encounter (Signed)
 Spoke with patient; she stated she has a double supply of the 25 mg and she'll start taking (2) 25 mg per day.

## 2023-12-19 NOTE — Telephone Encounter (Signed)
 Please ask the pt if her blood pressures have been consistently elevated or if that was a isolated reading.  If multiple bp readings are elevated please let her know that she should start taking 2 of the losartan  25mg  tablets and if that fixes the blood pressure I will resend it as 50mg  tablets.

## 2023-12-19 NOTE — Telephone Encounter (Signed)
 FYI Only or Action Required?: Action required by provider: request for appointment, medication refill request, and clinical question for provider.  Patient was last seen in primary care on 12/11/2023 by Dana Toribio POUR, MD.  Called Nurse Triage reporting Hypertension.  Symptoms began yesterday.  Interventions attempted: Prescription medications: Losartan .  Symptoms are: unchanged.  Triage Disposition: See PCP When Office is Open (Within 3 Days)  Patient/caregiver understands and will follow disposition?:   Copied from CRM #[id number]. Topic: Clinical - Red Word Triage >> Dec 19, 2023 12:58 PM Rosaria BRAVO wrote: Red Word that prompted transfer to Nurse Triage: 160/100 BP, medication concerns. Reason for Disposition  Systolic BP >= 160 OR Diastolic >= 100  Answer Assessment - Initial Assessment Questions No available appts today. Patient requests adjustment to Losartan  and previously stopped hydrochlorothiazide  per MD; requesting call back.  Advised UC today and ED/911 if symptoms worsen.  1. BLOOD PRESSURE: What is your blood pressure? Did you take at least two measurements 5 minutes apart?     160/100 BP yesterday. BP 153/104 HR 66 taken 1 hour after BP med. 2. ONSET: When did you take your blood pressure?     yesterday 3. HOW: How did you take your blood pressure? (e.g., automatic home BP monitor, visiting nurse)     Auto BP wrist; unable to recheck, did not have monitor close by 4. HISTORY: Do you have a history of high blood pressure?     HTN 5. MEDICINES: Are you taking any medicines for blood pressure? Have you missed any doses recently?     Losartan , last taken 0800. Stopped taking hydrochlorothiazide  since 12/12/23, per MD. 6. OTHER SYMPTOMS: Do you have any symptoms? (e.g., blurred vision, chest pain, difficulty breathing, headache, weakness)     Denies HA, blurred vision, chest pain, sob, weakness/ numbness  Protocols used: Blood Pressure - High-A-AH

## 2023-12-20 DIAGNOSIS — H2513 Age-related nuclear cataract, bilateral: Secondary | ICD-10-CM | POA: Diagnosis not present

## 2023-12-20 DIAGNOSIS — H04123 Dry eye syndrome of bilateral lacrimal glands: Secondary | ICD-10-CM | POA: Diagnosis not present

## 2023-12-20 DIAGNOSIS — Z9889 Other specified postprocedural states: Secondary | ICD-10-CM | POA: Diagnosis not present

## 2023-12-20 DIAGNOSIS — H40013 Open angle with borderline findings, low risk, bilateral: Secondary | ICD-10-CM | POA: Diagnosis not present

## 2023-12-20 LAB — OPHTHALMOLOGY REPORT-SCANNED

## 2023-12-23 ENCOUNTER — Other Ambulatory Visit: Payer: Self-pay | Admitting: Family Medicine

## 2023-12-23 DIAGNOSIS — E782 Mixed hyperlipidemia: Secondary | ICD-10-CM

## 2023-12-31 ENCOUNTER — Other Ambulatory Visit

## 2023-12-31 DIAGNOSIS — E782 Mixed hyperlipidemia: Secondary | ICD-10-CM | POA: Diagnosis not present

## 2024-01-01 ENCOUNTER — Ambulatory Visit: Payer: Self-pay | Admitting: Family Medicine

## 2024-01-01 LAB — LIPOPROTEIN A (LPA): Lipoprotein (a): 61.6 nmol/L (ref <75.0)

## 2024-01-01 LAB — APOLIPOPROTEIN B: Apolipoprotein B: 70 mg/dL (ref <90)

## 2024-01-10 ENCOUNTER — Encounter: Payer: Self-pay | Admitting: Family Medicine

## 2024-01-10 ENCOUNTER — Ambulatory Visit (INDEPENDENT_AMBULATORY_CARE_PROVIDER_SITE_OTHER): Admitting: Family Medicine

## 2024-01-10 ENCOUNTER — Other Ambulatory Visit: Payer: Self-pay | Admitting: Family Medicine

## 2024-01-10 VITALS — BP 118/81 | HR 82 | Ht 64.96 in | Wt 164.0 lb

## 2024-01-10 DIAGNOSIS — R7303 Prediabetes: Secondary | ICD-10-CM | POA: Diagnosis not present

## 2024-01-10 DIAGNOSIS — I10 Essential (primary) hypertension: Secondary | ICD-10-CM

## 2024-01-10 DIAGNOSIS — E782 Mixed hyperlipidemia: Secondary | ICD-10-CM | POA: Diagnosis not present

## 2024-01-10 DIAGNOSIS — Z0000 Encounter for general adult medical examination without abnormal findings: Secondary | ICD-10-CM

## 2024-01-10 DIAGNOSIS — R4189 Other symptoms and signs involving cognitive functions and awareness: Secondary | ICD-10-CM | POA: Diagnosis not present

## 2024-01-10 MED ORDER — HYDROCHLOROTHIAZIDE 12.5 MG PO CAPS: 12.5000 mg | ORAL_CAPSULE | Freq: Every day | ORAL | 3 refills | Status: DC

## 2024-01-10 NOTE — Assessment & Plan Note (Signed)
 Stable on rosuvastatin  (Crestor ). LDL was >190 in the past. ApoB and Lp(a) were checked previously and were normal. Counseled that statin is recommended for secondary prevention due to past LDL >190, which is likely genetic. Discussed coronary artery calcium  score as a risk assessment tool but advised that statin would still be recommended even with a score of zero. - Continue rosuvastatin . - Refill sent to [facility] on [address]. - Follow up in 1 year.

## 2024-01-10 NOTE — Assessment & Plan Note (Signed)
 Uncontrolled on losartan  50 mg monotherapy after stopping hydrochlorothiazide . Home BP monitoring was inaccurate due to a faulty wrist cuff. Patient requests to restart HCTZ due to high sodium intake and is amenable to taking two separate pills. Discussion regarding losartan  being a less potent ARB and HCTZ being a less potent thiazide, but no change is indicated if BP is controlled on combination therapy. - Restart hydrochlorothiazide  12.5 mg daily. - Continue losartan  50 mg daily. - Sent prescription for hydrochlorothiazide  12.5 mg to [facility] on [address]. - Counseled on obtaining a validated automatic upper arm BP cuff, recommended Omron brand. - Follow up in 1 year, or sooner if BP is not controlled.

## 2024-01-10 NOTE — Patient Instructions (Signed)
 It was nice to see you today,  We addressed the following topics today: - I have sent a prescription for hydrochlorothiazide  to the [facility] on [address]. Please restart taking it at 12.5 mg once daily along with your losartan . - I recommend you purchase a new, automatic upper arm blood pressure cuff. The brand Omron is reliable and validated. You can find them at most pharmacies. - Please monitor your blood pressure at home. If it does not improve with the addition of the hydrochlorothiazide , please contact the office to schedule a follow-up. - Please call the audiology department to schedule your hearing test appointment. - When you check out, please schedule your next annual physical exam for one year from the date of your last one.  Have a great day,  Rolan Slain, MD

## 2024-01-10 NOTE — Assessment & Plan Note (Signed)
 Scored 29/30 on SLUMS without much difficulty.  No concerns regarding cognitive function at this time.  Can recheck periodically.

## 2024-01-10 NOTE — Assessment & Plan Note (Signed)
 Patient is scheduled for an appointment with the genealogy clinic to discuss family history of pancreatic cancer. Discussion about the GeneConnect program, clarifying it is a separate initiative. Cognitive screen (Mini-Cog) was normal today (29/30). Patient still needs to schedule an audiology appointment. - Continue with plan for genealogy clinic appointment for genetic counseling. - Schedule annual physical for one year from last month. - Re-check cognitive function in 3-5 years or as needed.

## 2024-01-10 NOTE — Progress Notes (Signed)
 Established Patient Office Visit  Subjective   Patient ID: Dana Hodge, female    DOB: 1951-01-23  Age: 73 y.o. MRN: [id number]  Chief Complaint  Patient presents with   Medical Management of Chronic Issues    HPI  Subjective - Hypertension: Reports high BP readings at home with a wrist cuff. Wrist cuff was checked against office cuff and was noted to be reading at least 10 points higher. Started using a manual arm cuff which is difficult to use. Reports high sodium intake. Requests to restart hydrochlorothiazide . - Hyperlipidemia: Follow-up on cholesterol. - Cognition: Agreed to cognitive function testing.  Medications: Losartan  50 mg daily, previously on hydrochlorothiazide  12.5 mg daily which was stopped, Rosuvastatin  (Crestor ) dose not specified. History of leg cramps with atorvastatin  (Lipitor), but no issues with rosuvastatin . Needs refills for hydrochlorothiazide  and rosuvastatin .  PMH, PSH, FH, Social Hx: PMHx: Hypertension, Hyperlipidemia with LDL >190 in the past. FH: Mother had high cholesterol. Family history of pancreatic cancer is a concern, has an appointment with the genealogy clinic. Social Hx: Denies issues with current medications.  ROS: Constitutional: Denies issues. CV: Reports elevated home blood pressures. Musculoskeletal: Denies leg cramps on rosuvastatin . History of leg cramps on atorvastatin . Neurological: No new cognitive complaints.   The 10-year ASCVD risk score (Arnett DK, et al., 2019) is: 10.1%  Health Maintenance Due  Topic Date Due   Zoster Vaccines- Shingrix (1 of 2) 01/29/2003   COVID-19 Vaccine (3 - 2025-26 season) 10/22/2023   Medicare Annual Wellness (AWV)  02/01/2024      Objective:     BP 118/81   Pulse 82   Ht 5' 4.96 (1.65 m)   Wt 164 lb (74.4 kg)   SpO2 100%   BMI 27.32 kg/m    Physical Exam Gen: alert, oriented.  Neuro: Mini-Cog/Cognitive assessment performed. Score was 29/30. Recalled 4/5 objects (missed  house). Able to name >15 animals in one minute. Correctly performed serial subtractions and digit span backwards. Clock drawing was correct for 10 minutes to 11. Answered orientation and story recall questions correctly.   No results found for any visits on 01/10/24.      Assessment & Plan:   Mixed hyperlipidemia Assessment & Plan: Stable on rosuvastatin  (Crestor ). LDL was >190 in the past. ApoB and Lp(a) were checked previously and were normal. Counseled that statin is recommended for secondary prevention due to past LDL >190, which is likely genetic. Discussed coronary artery calcium  score as a risk assessment tool but advised that statin would still be recommended even with a score of zero. - Continue rosuvastatin . - Refill sent to [facility] on [address]. - Follow up in 1 year.   Essential hypertension Assessment & Plan: Uncontrolled on losartan  50 mg monotherapy after stopping hydrochlorothiazide . Home BP monitoring was inaccurate due to a faulty wrist cuff. Patient requests to restart HCTZ due to high sodium intake and is amenable to taking two separate pills. Discussion regarding losartan  being a less potent ARB and HCTZ being a less potent thiazide, but no change is indicated if BP is controlled on combination therapy. - Restart hydrochlorothiazide  12.5 mg daily. - Continue losartan  50 mg daily. - Sent prescription for hydrochlorothiazide  12.5 mg to [facility] on [address]. - Counseled on obtaining a validated automatic upper arm BP cuff, recommended Omron brand. - Follow up in 1 year, or sooner if BP is not controlled.  Orders: -     hydroCHLOROthiazide ; Take 1 capsule (12.5 mg total) by mouth daily.  Dispense: 90 capsule;  Refill: 3  Healthcare maintenance Assessment & Plan: Patient is scheduled for an appointment with the genealogy clinic to discuss family history of pancreatic cancer. Discussion about the GeneConnect program, clarifying it is a separate initiative. Cognitive screen  (Mini-Cog) was normal today (29/30). Patient still needs to schedule an audiology appointment. - Continue with plan for genealogy clinic appointment for genetic counseling. - Schedule annual physical for one year from last month. - Re-check cognitive function in 3-5 years or as needed.   Brain fog Assessment & Plan: Scored 29/30 on SLUMS without much difficulty.  No concerns regarding cognitive function at this time.  Can recheck periodically.     I spent 35 min in the mgmt of this patient  Return in about 11 months (around 12/09/2024) for physical.    Toribio MARLA Slain, MD

## 2024-01-15 ENCOUNTER — Ambulatory Visit: Payer: Self-pay | Admitting: *Deleted

## 2024-01-15 NOTE — Telephone Encounter (Signed)
 FYI Only or Action Required?: FYI only for provider: medication questions see NT encounter.  Patient was last seen in primary care on 01/10/2024 by Chandra Toribio POUR, MD.  Called Nurse Triage reporting Medication Problem.  Symptoms began several days ago. 01/11/24  Interventions attempted: Nothing.  Symptoms are: gradually worsening.  Triage Disposition: Call PCP Now  Patient/caregiver understands and will follow disposition?: No, wishes to speak with PCP    Patient requesting call back if she should take medication Microzide  12.5 mg every other day or stop and PCP switch to another medication . Reports she started having sx once she started Microzide  on 01/10/24            Copied from CRM #[id number]. Topic: Clinical - Red Word Triage >> Jan 15, 2024  2:02 PM Berwyn MATSU wrote: Red Word that prompted transfer to Nurse Triage: Patient is having side effect to  hydrochlorothiazide  (MICROZIDE ) 12.5 MG capsule patient experiencing nausea since starting back on the meds its constant and is unsure what to do.  OTC not working Reason for Disposition  [1] Caller has URGENT medicine question about med that primary care doctor (or NP/PA) or specialist prescribed AND [2] triager unable to answer question  Answer Assessment - Initial Assessment Questions Please advise if patient should take medication micorzide 12.5 mg every other day or stop and switch to a different medication ? Nausea and lightheaded after starting medication on 01/10/24 and side effects not going away x 5 days . Patient requesting call back. Please advise if another OV needed.  Recommended if sx worsen call back or go to UC/ED.         1. NAME of MEDICINE: What medicine(s) are you calling about?     Microzide  12.5 mg 2. QUESTION: What is your question? (e.g., double dose of medicine, side effect)     Should patient take medication every other day or stop and change back to something else? 3. PRESCRIBER: Who  prescribed the medicine? Reason: if prescribed by specialist, call should be referred to that group.     PCP  4. SYMPTOMS: Do you have any symptoms? If Yes, ask: What symptoms are you having?  How bad are the symptoms (e.g., mild, moderate, severe)     Mild at this time. Nausea, and lightheaded reports BP has been running in the 140's/ 70's. No chest pain no difficulty breathing no fever no headaches, no blurred vision, no vomiting. 5. PREGNANCY:  Is there any chance that you are pregnant? When was your last menstrual period?     na  Protocols used: Medication Question Call-A-AH

## 2024-01-16 ENCOUNTER — Other Ambulatory Visit: Payer: Self-pay | Admitting: Family Medicine

## 2024-01-16 MED ORDER — INDAPAMIDE 1.25 MG PO TABS: 1.2500 mg | ORAL_TABLET | Freq: Every day | ORAL | 2 refills | Status: AC

## 2024-01-16 NOTE — Telephone Encounter (Signed)
 Called patient LVM to contact the office if pt calls please advised

## 2024-01-16 NOTE — Telephone Encounter (Signed)
 Please let the pt know I am sending in a different prescription for a medication called indapamide .  She would take this once a day and stop taking the hydrochlorothiazide .  She should let us  know if she has the same side effects with this medication.

## 2024-01-19 ENCOUNTER — Other Ambulatory Visit: Payer: Self-pay

## 2024-01-19 DIAGNOSIS — I10 Essential (primary) hypertension: Secondary | ICD-10-CM

## 2024-01-21 NOTE — Telephone Encounter (Signed)
 Called pt she stated that she has no symptoms and she going to continued taking the hydrochlorothiazide .

## 2024-01-24 ENCOUNTER — Inpatient Hospital Stay

## 2024-01-24 ENCOUNTER — Inpatient Hospital Stay: Attending: Genetic Counselor

## 2024-01-24 DIAGNOSIS — Z800 Family history of malignant neoplasm of digestive organs: Secondary | ICD-10-CM | POA: Insufficient documentation

## 2024-01-24 DIAGNOSIS — Z801 Family history of malignant neoplasm of trachea, bronchus and lung: Secondary | ICD-10-CM | POA: Insufficient documentation

## 2024-01-24 DIAGNOSIS — Z803 Family history of malignant neoplasm of breast: Secondary | ICD-10-CM | POA: Insufficient documentation

## 2024-01-24 DIAGNOSIS — Z8052 Family history of malignant neoplasm of bladder: Secondary | ICD-10-CM | POA: Insufficient documentation

## 2024-01-24 LAB — GENETIC SCREENING ORDER

## 2024-01-24 NOTE — Progress Notes (Signed)
 REFERRING PROVIDER: Chandra Toribio POUR, MD [address] [place],  [place] [address]  PRIMARY PROVIDER:  Chandra Toribio POUR, MD  PRIMARY REASON FOR VISIT:  1. Family history of breast cancer   2. Family history of lung cancer   3. Family history of colon cancer   4. Family history of bladder cancer   5. Family history of pancreatic cancer    HISTORY OF PRESENT ILLNESS:   Dana Hodge, a 73 y.o. female, was seen on 01/24/2024 for a [facility] cancer genetics consultation  at the request of Dr. Chandra due to a family history of breast, pancreatic, colon, and bladder cancer.  Dana Hodge presents to clinic today to discuss the possibility of a hereditary predisposition to cancer, genetic testing, and to further clarify her future cancer risks, as well as potential cancer risks for family members.   CANCER HISTORY:  Dana Hodge is a 73 y.o. female with no personal history of cancer.    RELEVANT MEDICAL HISTORY AND RISK FACTORS:  Menarche was at age 77.  First live birth at age 68.  OCP use for approximately 3-4  years total.  Ovaries intact: yes.  Hysterectomy: no.  Menopausal status: postmenopausal at age 50 HRT use: 0 years. Mammogram within the last year: yes. She reports getting them fairly regularly Breast density: B Number of breast biopsies: 0. Up to date with pelvic exams: reports she does not do pap smears and pelvic exams. Colonoscopy:  July 2011- 2 hyperplastic polyps August 2021- 1 TA, 1 inflammatory fibroid polyp, 1 hyperplastic polyps.  She reports 7 year follow-up plan from last colonoscopy. Any excessive radiation exposure in the past: no Other cancer screenings: no.  Exposures: no.   Past Medical History:  Diagnosis Date   Chicken pox    Colon polyp    Family history of bladder cancer    Family history of breast cancer    Family history of colon cancer    Family history of lung cancer    Family history of pancreatic cancer    Hyperlipidemia    Hypertension    UTI  (urinary tract infection)     Past Surgical History:  Procedure Laterality Date   APPENDECTOMY     COLONOSCOPY  09/03/2009   hyperplastic polyps   LAPAROSCOPIC APPENDECTOMY N/A 01/15/2016   Procedure: APPENDECTOMY LAPAROSCOPIC. DRAINAGE OF INTRA ABDOMINAL ABSCESSES X 3, LYSIS OF ADHESIONS;  Surgeon: Elspeth Schultze, MD;  Location: [facility];  Service: General;  Laterality: N/A;    Social History   Socioeconomic History   Marital status: Married    Spouse name: Not on file   Number of children: Not on file   Years of education: Not on file   Highest education level: Not on file  Occupational History   Not on file  Tobacco Use   Smoking status: Never    Passive exposure: Never   Smokeless tobacco: Never  Vaping Use   Vaping status: Never Used  Substance and Sexual Activity   Alcohol use: Yes    Comment: a glass of wine with dinner    Drug use: No   Sexual activity: Yes    Birth control/protection: None  Other Topics Concern   Not on file  Social History Narrative   Work or School: works as naval architect, office work      Home Situation: lives with husband and handicapped adult son      Spiritual Beliefs: Christian      Lifestyle: going to  the gym 3-4 days per week, working on diet - portion sizes are an issue            Social Drivers of Corporate Investment Banker Strain: Low Risk  (12/11/2023)   Overall Financial Resource Strain (CARDIA)    Difficulty of Paying Living Expenses: Not hard at all  Food Insecurity: No Food Insecurity (12/11/2023)   Hunger Vital Sign    Worried About Running Out of Food in the Last Year: Never true    Ran Out of Food in the Last Year: Never true  Transportation Needs: No Transportation Needs (12/11/2023)   PRAPARE - Administrator, Civil Service (Medical): No    Lack of Transportation (Non-Medical): No  Physical Activity: Inactive (12/11/2023)   Exercise Vital Sign    Days of Exercise per Week: 0 days    Minutes of  Exercise per Session: 0 min  Stress: No Stress Concern Present (12/11/2023)   [employer] of Occupational Health - Occupational Stress Questionnaire    Feeling of Stress: Not at all  Social Connections: Moderately Isolated (02/01/2023)   Social Connection and Isolation Panel    Frequency of Communication with Friends and Family: More than three times a week    Frequency of Social Gatherings with Friends and Family: Not on file    Attends Religious Services: Never    Database Administrator or Organizations: No    Attends Banker Meetings: Never    Marital Status: Married     FAMILY HISTORY:  We obtained a detailed, 4-generation family history.  Significant diagnoses are listed below: Family History  Problem Relation Age of Onset   Heart disease Mother 26       CAD   Hypertension Mother    Hyperlipidemia Mother    Breast cancer Mother 52   Heart disease Father 66       MI   COPD Father    Hypertension Sister    Cancer Maternal Uncle    Bladder Cancer Maternal Uncle        used tobacco   Pancreatic cancer Maternal Uncle    Cancer Paternal Aunt        unknown type   Alcoholism Paternal Uncle    Lung cancer Paternal Uncle        used tobacco   Stroke Maternal Grandmother    Colon cancer Maternal Grandfather    Breast cancer Maternal Cousin 65       lumpectomy   Cancer Maternal Cousin        unknown type   Cancer Maternal Cousin 4   Colon polyps Neg Hx    Esophageal cancer Neg Hx    Rectal cancer Neg Hx    Stomach cancer Neg Hx     [name] reports the following family history of cancer:  Her mother was diagnosed with breast cancer at age 21. She was offered genetic testing and declined.  Her maternal uncle had bladder cancer and used tobacco. Her other maternal uncle had pancreatic cancer when he was in his 54s and is living at age 62. She does not know if he has had any genetic testing One of  her maternal cousins had breast cancer at age 59 and  had a lumpectomy. One of her other maternal cousins had cancer, but an unknown type. Another maternal cousin had cancer diagnosed at age 22 she did not know the type of cancer this was. Her maternal grandfather had colon  cancer  Her paternal uncle had lung cancer and used tobacco. Her paternal aunt had cancer but an unknown type.  There is no reported Ashkenazi Jewish ancestry. There is no known consanguinity.  GENETIC COUNSELING ASSESSMENT:  Dana Hodge is a 73 y.o. female with a family history of breast cancer and pancreatic cancer which is somewhat suggestive of a hereditary cancer syndrome and predisposition to cancer given this history. We, therefore, discussed and recommended the following at today's visit.   DISCUSSION: We discussed that, in general, most cancer is not inherited in families, but instead is sporadic or familial. Sporadic cancers occur by chance and typically happen at older ages (>50 years) as this type of cancer is caused by genetic changes acquired during an individual's lifetime. Some families have more cancers than would be expected by chance; however, the ages or types of cancer are not consistent with a known genetic mutation or known genetic mutations have been ruled out. This type of familial cancer is thought to be due to a combination of multiple genetic, environmental, hormonal, and lifestyle factors. While this combination of factors likely increases the risk of cancer, the exact source of this risk is not currently identifiable or testable.  We discussed that 5 - 10% of breast cancer is hereditary. Most cases of hereditary breast cancer are associated with BRCA1 and BRCA2 genes, although there are other genes associated with hereditary breast cancer as well. We discussed that breast and pancreatic cancer can be genetically linked, as can colon and bladder cancer. Cancer risks and management strategies are gene specific. We discussed that genetic testing can beneficial  for several reasons, including clarifying specific cancer risks, identifying potential screening and risk-reduction options that may be appropriate, and to understand if other family members could be at risk for cancer and allow them to undergo genetic testing to clarify their cancer risks.   We reviewed the characteristics, features and inheritance patterns of hereditary cancer syndromes. We also discussed genetic testing, including the appropriate family members to test, the process of testing, insurance coverage and turn-around-time for results. We discussed the implications of a negative, positive, carrier and/or variant of uncertain significant result.   Dana Hodge was offered the [facility] CancerNext + RNAinsight gene panel which includes sequencing, rearrangement analysis, and RNA analysis for the following 40 genes: APC, ATM, BAP1, BARD1, BMPR1A, BRCA1, BRCA2, BRIP1, CDH1, CDKN2A, CHEK2, FH, FLCN, MET, MLH1, MSH2, MSH6, MUTYH, NF1, NTHL1, PALB2, PMS2, PTEN, RAD51C, RAD51D, RPS20, SMAD4, STK11, TP53, TSC1, TSC2, and VHL (sequencing and deletion/duplication); AXIN2, HOXB13, MBD4, MSH3, POLD1 and POLE (sequencing only); EPCAM and GREM1 (deletion/duplication only).  Dana Hodge was also offered the [facility] CancerNext-Expanded + RNAinsight gene panel which includes sequencing, rearrangement, and RNA analysis for the following 77 genes: AIP, ALK, APC, ATM, AXIN2, BAP1, BARD1, BMPR1A, BRCA1, BRCA2, BRIP1, CDC73, CDH1, CDK4, CDKN1B, CDKN2A, CEBPA, CHEK2, CTNNA1, DDX41, DICER1, ETV6, FH, FLCN, GATA2, LZTR1, MAX, MBD4, MEN1, MET, MLH1, MSH2, MSH3, MSH6, MUTYH, NF1, NF2, NTHL1, PALB2, PHOX2B, PMS2, POT1, PRKAR1A, PTCH1, PTEN, RAD51C, RAD51D, RB1, RET, RPS20, RUNX1, SDHA, SDHAF2, SDHB, SDHC, SDHD, SMAD4, SMARCA4, SMARCB1, SMARCE1, STK11, SUFU, TMEM127, TP53, TSC1, TSC2, VHL, and WT1 (sequencing and deletion/duplication); EGFR, HOXB13, KIT, MITF, PDGFRA, POLD1, and POLE (sequencing only); EPCAM and GREM1  (deletion/duplication only).   Dana Hodge was informed of the benefits and limitations of each panel, including that expanded pan-cancer panels contain genes may not have clear management guidelines at this point in time. We also discussed that as the number  of genes included on a panel increases, the chances of variants of uncertain significance increases. After considering the risks, benefits, and limitations, Dana Hodge provided informed consent to pursue genetic testing. Dana Hodge decided to pursue genetic testing for the [facility] CancerNext- Expanded 77 gene panel.   Based on Dana Hodge's family history of cancer, she meets medical criteria for genetic testing. She meets criteria due to her mother meeting genetic testing criteria due to having her own diagnosis of breast cancer and a first degree relative with pancreatic cancer. Though Dana Hodge is not personally affected, there are no affected family members that are willing/able/available to undergo hereditary cancer testing.  Therefore, Dana Hodge the most informative family member available.  Despite that she meets criteria, she may still have an out of pocket cost. We discussed that if her out of pocket cost for testing is over $100, the laboratory will call and confirm whether she wants to proceed with testing.  If the out of pocket cost of testing is less than $100 she will be billed by the genetic testing laboratory. Dana Hodge filled out the [employer] Form today in office and her out of pocket cost was said to be $0.  We discussed that some people do not want to undergo genetic testing due to fear of genetic discrimination.  The Genetic Information Nondiscrimination Act (GINA) was signed into federal law in 2008. GINA prohibits health insurers and most employers from discriminating against individuals based on genetic information (including the results of genetic tests and family history information). According to GINA, health  insurance companies cannot consider genetic information to be a preexisting condition, nor can they use it to make decisions regarding coverage or rates. GINA also makes it illegal for most employers to use genetic information in making decisions about hiring, firing, promotion, or terms of employment. It is important to note that GINA does not offer protections for life insurance, disability insurance, or long-term care insurance. GINA does not apply to those in the [employer], those who work for companies with less than 15 employees, and new life insurance or long-term disability insurance policies.  Health status due to a cancer diagnosis is not protected under GINA. More information about GINA can be found by visiting eliteclients.be.  PLAN: After considering the risks, benefits, and limitations, Dana Hodge provided informed consent to pursue genetic testing and the blood sample was sent to [facility] for analysis of the CancerNext-Expanded + RNA 77 gene panel. Results should be available within approximately 2-3 weeks' time, at which point they will be disclosed by telephone to Dana Hodge, as will any additional recommendations warranted by these results. Dana Hodge will receive a summary of her genetic counseling visit and a copy of her results once available. This information will also be available in Epic.   Based on Dana Hodge's family history, we recommended her brother, who was diagnosed with pancreatic cancer at age 87 and is still living at 71 years old, have genetic counseling and testing. Dana Hodge will let us  know if we can be of any assistance in coordinating genetic counseling and/or testing for this family member.   RESOURCES PROVIDED:  Ms. Garbutt was provided with the following:  [facility] Billing information  [facility] Contact card   Lastly, we encouraged Ms. Stegmaier to remain in contact with cancer genetics annually so that we can continuously update the family  history and inform her of any changes in cancer genetics and testing  that may be of benefit for this family.   Ms. Lewing questions were answered to her satisfaction today. Our contact information was provided should additional questions or concerns arise. Thank you for the referral and allowing us  to share in the care of your patient.   Kaedyn Polivka R. Bluford, MS, [facility] Certified [email or web address] phone: [phone]  I personally spent a total of 45 minutes in the care of the patient today including preparing to see the patient, getting/reviewing separately obtained history, counseling and educating, placing orders, and documenting clinical information in the EHR.  The patient was seen alone.  Drs. Lanny Stalls, and/or Gudena were available for questions, if needed.   _______________________________________________________________________ For Office Staff:  Number of people involved in session: 1 Was an Intern/ student involved with case: no

## 2024-02-06 ENCOUNTER — Telehealth: Payer: Self-pay

## 2024-02-06 ENCOUNTER — Ambulatory Visit: Payer: Self-pay

## 2024-02-06 DIAGNOSIS — Z1379 Encounter for other screening for genetic and chromosomal anomalies: Secondary | ICD-10-CM

## 2024-02-06 DIAGNOSIS — Z801 Family history of malignant neoplasm of trachea, bronchus and lung: Secondary | ICD-10-CM

## 2024-02-06 DIAGNOSIS — Z803 Family history of malignant neoplasm of breast: Secondary | ICD-10-CM

## 2024-02-06 DIAGNOSIS — Z8052 Family history of malignant neoplasm of bladder: Secondary | ICD-10-CM

## 2024-02-06 DIAGNOSIS — Z800 Family history of malignant neoplasm of digestive organs: Secondary | ICD-10-CM

## 2024-02-06 NOTE — Telephone Encounter (Signed)
 Attempted to disclose genetic testing results. Ms. Selman asked me to call back in about 15 minutes.

## 2024-02-06 NOTE — Telephone Encounter (Signed)
 I contacted Ms. Amezcua to discuss her genetic testing results. The test that was ordered was the [facility] CancerNext-Expanded + RNAinsight gene panel which includes sequencing, rearrangement, and RNA analysis for the following 77 genes: AIP, ALK, APC, ATM, AXIN2, BAP1, BARD1, BMPR1A, BRCA1, BRCA2, BRIP1, CDC73, CDH1, CDK4, CDKN1B, CDKN2A, CEBPA, CHEK2, CTNNA1, DDX41, DICER1, ETV6, FH, FLCN, GATA2, LZTR1, MAX, MBD4, MEN1, MET, MLH1, MSH2, MSH3, MSH6, MUTYH, NF1, NF2, NTHL1, PALB2, PHOX2B, PMS2, POT1, PRKAR1A, PTCH1, PTEN, RAD51C, RAD51D, RB1, RET, RPS20, RUNX1, SDHA, SDHAF2, SDHB, SDHC, SDHD, SMAD4, SMARCA4, SMARCB1, SMARCE1, STK11, SUFU, TMEM127, TP53, TSC1, TSC2, VHL, and WT1 (sequencing and deletion/duplication); EGFR, HOXB13, KIT, MITF, PDGFRA, POLD1, and POLE (sequencing only); EPCAM and GREM1 (deletion/duplication only).    The report date is 02/02/2024.  No pathogenic variants were identified in the 77 genes analyzed. 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 No changes in management are recommended based on this result. We do not recommend anyone else in the family get tested for this uncertain gene change. Detailed clinic note to follow.   Discussed that this does not explain the family history of cancer.  It could be due to a different gene that we are not testing, or maybe our current technology may not be able to pick something up.  It will be important for her to keep in contact with genetics to keep up with whether additional testing may be needed.    The test report has been scanned into EPIC and is located under the Molecular Pathology section of the Results Review tab.  A portion of the result report is included below for reference.      Warren Ahle, MS, [facility] Cancer Genetic Counselor [email or web address] [phone]

## 2024-02-06 NOTE — Progress Notes (Signed)
 HPI:  [name] was previously seen in the [place] [facility] due to a family history of breast, pancreatic, colon, bladder, and lung Dana Hodge. and concerns regarding a hereditary predisposition to Dana Hodge. Please refer to our prior Dana Hodge genetics clinic note for more information regarding our discussion, assessment and recommendations, at the time. [name] recent genetic test results were disclosed to her, as were recommendations warranted by these results. These results and recommendations are discussed in more detail below.  Dana Hodge HISTORY:  [name] is a 73 y.o. female with no personal history of Dana Hodge.    FAMILY HISTORY:  We obtained a detailed, 4-generation family history.  Significant diagnoses are listed below: Family History  Problem Relation Age of Onset   Heart disease Mother 33       CAD   Hypertension Mother    Hyperlipidemia Mother    Breast Dana Hodge Mother 76   Heart disease Father 11       MI   COPD Father    Hypertension Sister    Dana Hodge Maternal Uncle    Bladder Dana Hodge Maternal Uncle        used tobacco   Pancreatic Dana Hodge Maternal Uncle    Dana Hodge Paternal Aunt        unknown type   Alcoholism Paternal Uncle    Lung Dana Hodge Paternal Uncle        used tobacco   Stroke Maternal Grandmother    Colon Dana Hodge Maternal Grandfather    Breast Dana Hodge Maternal Cousin 65       lumpectomy   Dana Hodge Maternal Cousin        unknown type   Dana Hodge Maternal Cousin 4   Colon polyps Neg Hx    Esophageal Dana Hodge Neg Hx    Rectal Dana Hodge Neg Hx    Stomach Dana Hodge Neg Hx       GENETIC TEST RESULTS: Genetic testing reported out on 02/02/2024 through the [facility] gene panel which includes sequencing, rearrangement, and RNA analysis for the following 77 genes: AIP, ALK, APC, ATM, AXIN2, BAP1, BARD1, BMPR1A, BRCA1, BRCA2, BRIP1, CDC73, CDH1, CDK4, CDKN1B, CDKN2A, CEBPA, CHEK2, CTNNA1, DDX41, DICER1, ETV6, FH, FLCN, GATA2, LZTR1, MAX, MBD4,  MEN1, MET, MLH1, MSH2, MSH3, MSH6, MUTYH, NF1, NF2, NTHL1, PALB2, PHOX2B, PMS2, POT1, PRKAR1A, PTCH1, PTEN, RAD51C, RAD51D, RB1, RET, RPS20, RUNX1, SDHA, SDHAF2, SDHB, SDHC, SDHD, SMAD4, SMARCA4, SMARCB1, SMARCE1, STK11, SUFU, TMEM127, TP53, TSC1, TSC2, VHL, and WT1 (sequencing and deletion/duplication); EGFR, HOXB13, KIT, MITF, PDGFRA, POLD1, and POLE (sequencing only); EPCAM and GREM1 (deletion/duplication only).    This Dana Hodge panel found no pathogenic mutations in any of the genes listed above. The test report has been scanned into EPIC and is located under the Molecular Pathology section of the Results Review tab.  A portion of the result report is included below for reference.     We discussed with [name] that because current genetic testing is not perfect, it is possible there may be a gene mutation in one of these genes that current testing cannot detect, but that chance is small.  We also discussed, that there could be another gene that has not yet been discovered, or that we have not yet tested, that is responsible for the Dana Hodge diagnoses in the family. It is also possible there is a hereditary cause for the Dana Hodge in the family that [name] did not inherit and therefore was not identified in her testing.  Therefore, it is important to remain in  touch with Dana Hodge genetics in the future so that we can continue to offer [name] the most up to date genetic testing.   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  At this time, it is unknown if this variant is associated with increased Dana Hodge risk or if this is a normal finding, but most variants such as this get reclassified to being inconsequential. It should not be used to make medical management decisions. With time, we suspect the lab will determine the significance of this variant, if any. If we do learn more about it, we will try to contact [name] to discuss it  further. However, it is important to stay in touch with us  periodically and keep the address and phone number up to date.  ADDITIONAL GENETIC TESTING: We discussed with [name] that her genetic testing was fairly extensive.  If there are genes identified to increase Dana Hodge risk that can be analyzed in the future, we would be happy to discuss and coordinate this testing at that time.    Dana Hodge SCREENING RECOMMENDATIONS: [name] test result is considered negative (normal).  This means that we have not identified a hereditary cause for her family history of Dana Hodge at this time. Most cancers happen by chance and this negative test suggests that her family history of Dana Hodge may fall into this category.    Possible reasons for [name] negative genetic test include:  1. There may be a gene mutation in one of these genes that current testing methods cannot detect but that chance is small.  2. There could be another gene that has not yet been discovered, or that we have not yet tested, that is responsible for the Dana Hodge diagnoses in the family.  3.  There may be no hereditary risk for Dana Hodge in the family. The cancers in [name] and/or her family may be sporadic/familial or due to other genetic and environmental factors. 4. It is also possible there is a hereditary cause for the Dana Hodge in the family that [name] did not inherit.  Therefore, it is recommended she continue to follow the Dana Hodge management and screening guidelines provided by her primary healthcare provider. An individual's Dana Hodge risk and medical management are not determined by genetic test results alone. Overall Dana Hodge risk assessment incorporates additional factors, including personal medical history, family history, and any available genetic information that may result in a personalized plan for Dana Hodge prevention and surveillance  An individual's Dana Hodge risk and medical management are not determined by genetic test results alone.  Overall Dana Hodge risk assessment incorporates additional factors, including personal medical history, family history, and any available genetic information that may result in a personalized plan for Dana Hodge prevention and surveillance.  The Tyrer-Cuzick model is one of multiple prediction models developed to estimate an individual's lifetime risk of developing breast Dana Hodge. The Tyrer-Cuzick model is endorsed by the [employer] ([employer]CCN). This model includes many risk factors such as family history, endogenous estrogen exposure, and benign breast disease. The calculation is highly-dependent on the accuracy of clinical data provided by the patient and can change over time. The Tyrer-Cuzick model may be repeated to reflect new information in her personal or family history in the future.   [name] has NOT been determined to be at a high risk for breast Dana Hodge. Her Tyrer-Cuzick risk score is 11.8%. Women with this score greater than 20% are eligible for high  risk breast screening.      RECOMMENDATIONS FOR FAMILY MEMBERS:   Since she did not inherit a identifiable mutation in a Dana Hodge predisposition gene included on this panel, her children could not have inherited a known mutation from her in one of these genes. Individuals in this family might be at some increased risk of developing Dana Hodge, over the general population risk, simply due to the family history of Dana Hodge.  We recommended women in this family have a yearly mammogram beginning at age 66, or 9 years younger than the earliest onset of Dana Hodge, an annual clinical breast exam, and perform monthly breast self-exams. Women in this family should also have a gynecological exam as recommended by their primary provider. All family members should be referred for colonoscopy starting at age 56, or 51 years younger than the earliest onset of Dana Hodge.  It is also possible there is a hereditary cause for the Dana Hodge in [name] family that  she did not inherit and therefore was not identified in her.  Based on [name]'s family history, we recommended her sister consider genetic counseling and testing, as she independently meets genetic testing criteria based on the family history. We also recommended that her maternal uncle consider genetic testing, as he meets criteria based on his history of pancreatic Dana Hodge. [name] will let us  know if we can be of any assistance in coordinating genetic counseling and/or testing for this family member.   FOLLOW-UP: Lastly, we discussed with Ms. Youngers that Dana Hodge genetics is a rapidly advancing field and it is possible that new genetic tests will be appropriate for her and/or her family members in the future. We encouraged her to remain in contact with Dana Hodge genetics on an annual basis so we can update her personal and family histories and let her know of advances in Dana Hodge genetics that may benefit this family.   Our contact number was provided. Ms. Agresti questions were answered to her satisfaction, and she knows she is welcome to call us  at anytime with additional questions or concerns.   Warren Ahle, MS, [facility] Genetic Counselor [email or web address] [phone]

## 2024-02-28 ENCOUNTER — Ambulatory Visit: Payer: Medicare Other

## 2024-02-28 VITALS — Ht 64.5 in | Wt 166.0 lb

## 2024-02-28 DIAGNOSIS — Z0000 Encounter for general adult medical examination without abnormal findings: Secondary | ICD-10-CM | POA: Diagnosis not present

## 2024-02-28 DIAGNOSIS — H9193 Unspecified hearing loss, bilateral: Secondary | ICD-10-CM

## 2024-02-28 NOTE — Patient Instructions (Addendum)
 Dana Hodge,  Thank you for taking the time for your Medicare Wellness Visit. I appreciate your continued commitment to your health goals. Please review the care plan we discussed, and feel free to reach out if I can assist you further.  Please note that Annual Wellness Visits do not include a physical exam. Some assessments may be limited, especially if the visit was conducted virtually. If needed, we may recommend an in-person follow-up with your provider.  Ongoing Care Seeing your primary care provider every 3 to 6 months helps us  monitor your health and provide consistent, personalized care.   Referrals If a referral was made during today's visit and you haven't received any updates within two weeks, please contact the referred provider directly to check on the status.  Recommended Screenings:  Health Maintenance  Topic Date Due   Pneumococcal Vaccine for age over 36 (1 of 1 - PCV) Never done   Zoster (Shingles) Vaccine (1 of 2) 01/29/2003   COVID-19 Vaccine (3 - 2025-26 season) 10/22/2023   Medicare Annual Wellness Visit  02/01/2024   Flu Shot  05/20/2024*   Breast Cancer Screening  12/11/2025   Colon Cancer Screening  10/14/2026   DTaP/Tdap/Td vaccine (3 - Td or Tdap) 01/23/2028   Osteoporosis screening with Bone Density Scan  Completed   Hepatitis C Screening  Completed   Meningitis B Vaccine  Aged Out  *Topic was postponed. The date shown is not the original due date.       02/28/2024    1:12 PM  Advanced Directives  Does Patient Have a Medical Advance Directive? No  Would patient like information on creating a medical advance directive? No - Patient declined    Vision: Annual vision screenings are recommended for early detection of glaucoma, cataracts, and diabetic retinopathy. These exams can also reveal signs of chronic conditions such as diabetes and high blood pressure.  Dental: Annual dental screenings help detect early signs of oral cancer, gum disease, and other  conditions linked to overall health, including heart disease and diabetes.

## 2024-02-28 NOTE — Progress Notes (Signed)
 "  Chief Complaint  Patient presents with   Medicare Wellness     Subjective:   Dana Hodge is a 74 y.o. female who presents for a Medicare Annual Wellness Visit.  Visit info / Clinical Intake: Medicare Wellness Visit Type:: Subsequent Annual Wellness Visit Persons participating in visit and providing information:: patient Medicare Wellness Visit Mode:: Telephone If telephone:: video declined Since this visit was completed virtually, some vitals may be partially provided or unavailable. Missing vitals are due to the limitations of the virtual format.: Documented vitals are patient reported If Telephone or Video please confirm:: I connected with patient using audio/video enable telemedicine. I verified patient identity with two identifiers, discussed telehealth limitations, and patient agreed to proceed. Patient Location:: Home Provider Location:: Office Interpreter Needed?: No Pre-visit prep was completed: yes AWV questionnaire completed by patient prior to visit?: no Living arrangements:: lives with spouse/significant other Patient's Overall Health Status Rating: good Typical amount of pain: none Does pain affect daily life?: no Are you currently prescribed opioids?: no  Dietary Habits and Nutritional Risks How many meals a day?: 3 Eats fruit and vegetables daily?: yes Most meals are obtained by: preparing own meals; eating out In the last 2 weeks, have you had any of the following?: none Diabetic:: no  Functional Status Activities of Daily Living (to include ambulation/medication): Independent Ambulation: Independent with device- listed below Home Assistive Devices/Equipment: Eyeglasses Medication Administration: Independent Home Management (perform basic housework or laundry): Independent Manage your own finances?: yes Primary transportation is: driving Concerns about vision?: no *vision screening is required for WTM* Concerns about hearing?: no  Fall Screening Falls  in the past year?: 0 Number of falls in past year: 0 Was there an injury with Fall?: 0 Fall Risk Category Calculator: 0 Patient Fall Risk Level: Low Fall Risk  Fall Risk Patient at Risk for Falls Due to: No Fall Risks Fall risk Follow up: Falls evaluation completed; Falls prevention discussed  Home and Transportation Safety: All rugs have non-skid backing?: N/A, no rugs All stairs or steps have railings?: yes (outside) Grab bars in the bathtub or shower?: (!) no Have non-skid surface in bathtub or shower?: yes Good home lighting?: yes Regular seat belt use?: yes Hospital stays in the last year:: no  Cognitive Assessment Difficulty concentrating, remembering, or making decisions? : no Will 6CIT or Mini Cog be Completed: yes What year is it?: 0 points What month is it?: 0 points Give patient an address phrase to remember (5 components): [address] [place], [place] About what time is it?: 0 points  Advance Directives (For Healthcare) Does Patient Have a Medical Advance Directive?: No Would patient like information on creating a medical advance directive?: No - Patient declined  Reviewed/Updated  Reviewed/Updated: Reviewed All (Medical, Surgical, Family, Medications, Allergies, Care Teams, Patient Goals)    Allergies (verified) Patient has no known allergies.   Current Medications (verified) Outpatient Encounter Medications as of 02/28/2024  Medication Sig   b complex vitamins tablet Take 1 tablet by mouth daily.   Blood Pressure Monitoring (BLOOD PRESSURE MONITOR/M CUFF) MISC 1 each by Does not apply route 2 (two) times daily as needed.   calcium  carbonate (TUMS - DOSED IN MG ELEMENTAL CALCIUM ) 500 MG chewable tablet Chew 1 tablet by mouth daily.   Cholecalciferol (D3 ADULT PO) Take 1 tablet by mouth daily.   co-enzyme Q-10 30 MG capsule Take 200 mg by mouth daily.   hydrochlorothiazide  (MICROZIDE ) 12.5 MG capsule Take 12.5 mg by mouth daily.  KRILL OIL PO Take 1 tablet by  mouth daily.   losartan  (COZAAR ) 25 MG tablet Take 1 tablet by mouth once daily   magnesium  gluconate (MAGONATE) 500 MG tablet Take 500 mg by mouth daily.   rosuvastatin  (CRESTOR ) 10 MG tablet Take 1 tablet by mouth once daily   Blood Pressure Monitoring (3 SERIES BP MONITOR/WRIST) DEVI Blood pressure monitoring at home 1 to 2 times daily prn   indapamide  (LOZOL ) 1.25 MG tablet Take 1 tablet (1.25 mg total) by mouth daily.   No facility-administered encounter medications on file as of 02/28/2024.    History: Past Medical History:  Diagnosis Date   Chicken pox    Colon polyp    Family history of bladder cancer    Family history of breast cancer    Family history of colon cancer    Family history of lung cancer    Family history of pancreatic cancer    Hyperlipidemia    Hypertension    UTI (urinary tract infection)    Past Surgical History:  Procedure Laterality Date   APPENDECTOMY     COLONOSCOPY  09/03/2009   hyperplastic polyps   LAPAROSCOPIC APPENDECTOMY N/A 01/15/2016   Procedure: APPENDECTOMY LAPAROSCOPIC. DRAINAGE OF INTRA ABDOMINAL ABSCESSES X 3, LYSIS OF ADHESIONS;  Surgeon: Elspeth Schultze, MD;  Location: [facility];  Service: General;  Laterality: N/A;   Family History  Problem Relation Age of Onset   Heart disease Mother 34       CAD   Hypertension Mother    Hyperlipidemia Mother    Breast cancer Mother 46   Heart disease Father 11       MI   COPD Father    Hypertension Sister    Cancer Maternal Uncle    Bladder Cancer Maternal Uncle        used tobacco   Pancreatic cancer Maternal Uncle    Cancer Paternal Aunt        unknown type   Alcoholism Paternal Uncle    Lung cancer Paternal Uncle        used tobacco   Stroke Maternal Grandmother    Colon cancer Maternal Grandfather    Breast cancer Maternal Cousin 65       lumpectomy   Cancer Maternal Cousin        unknown type   Cancer Maternal Cousin 4   Colon polyps Neg Hx    Esophageal cancer Neg Hx    Rectal  cancer Neg Hx    Stomach cancer Neg Hx    Social History   Occupational History   Not on file  Tobacco Use   Smoking status: Never    Passive exposure: Never   Smokeless tobacco: Never  Vaping Use   Vaping status: Never Used  Substance and Sexual Activity   Alcohol use: Yes    Alcohol/week: 1.0 standard drink of alcohol    Types: 1 Glasses of wine per week    Comment: a glass of wine with dinner    Drug use: No   Sexual activity: Yes    Birth control/protection: None   Tobacco Counseling Counseling given: No  SDOH Screenings   Food Insecurity: No Food Insecurity (02/28/2024)  Housing: Unknown (02/28/2024)  Transportation Needs: No Transportation Needs (02/28/2024)  Utilities: Not At Risk (02/28/2024)  Alcohol Screen: Low Risk (02/01/2023)  Depression (PHQ2-9): Low Risk (02/28/2024)  Financial Resource Strain: Low Risk (12/11/2023)  Physical Activity: Inactive (02/28/2024)  Social Connections: Moderately Isolated (02/28/2024)  Stress: No  Stress Concern Present (02/28/2024)  Tobacco Use: Low Risk (02/28/2024)  Health Literacy: Adequate Health Literacy (02/28/2024)   See flowsheets for full screening details  Depression Screen PHQ 2 & 9 Depression Scale- Over the past 2 weeks, how often have you been bothered by any of the following problems? Little interest or pleasure in doing things: 0 Feeling down, depressed, or hopeless (PHQ Adolescent also includes...irritable): 0 PHQ-2 Total Score: 0 Trouble falling or staying asleep, or sleeping too much: 0 Feeling tired or having little energy: 0 Poor appetite or overeating (PHQ Adolescent also includes...weight loss): 0 Feeling bad about yourself - or that you are a failure or have let yourself or your family down: 0 Trouble concentrating on things, such as reading the newspaper or watching television (PHQ Adolescent also includes...like school work): 0 Moving or speaking so slowly that other people could have noticed. Or the opposite - being  so fidgety or restless that you have been moving around a lot more than usual: 0 Thoughts that you would be better off dead, or of hurting yourself in some way: 0 PHQ-9 Total Score: 0 If you checked off any problems, how difficult have these problems made it for you to do your work, take care of things at home, or get along with other people?: Not difficult at all  Depression Treatment Depression Interventions/Treatment : EYV7-0 Score <4 Follow-up Not Indicated     Goals Addressed               This Visit's Progress     Patient Stated (pt-stated)        Patient stated she plans to walk more and wants to lose more weight.  Also checking bp as well.             Objective:    Today's Vitals   02/28/24 1308  Weight: 166 lb (75.3 kg)  Height: 5' 4.5 (1.638 m)   Body mass index is 28.05 kg/m.  Hearing/Vision screen Hearing Screening - Comments:: C/o difficulty hearing - referral to Audiology Vision Screening - Comments:: Wears rx glasses - up to date with routine eye exams with [facility] - Dr C.Van Immunizations and Health Maintenance Health Maintenance  Topic Date Due   Pneumococcal Vaccine: 50+ Years (1 of 1 - PCV) Never done   Zoster Vaccines- Shingrix (1 of 2) 01/29/2003   COVID-19 Vaccine (3 - 2025-26 season) 10/22/2023   Influenza Vaccine  05/20/2024 (Originally 09/21/2023)   Medicare Annual Wellness (AWV)  02/27/2025   Mammogram  12/11/2025   Colonoscopy  10/14/2026   DTaP/Tdap/Td (3 - Td or Tdap) 01/23/2028   Bone Density Scan  Completed   Hepatitis C Screening  Completed   Meningococcal B Vaccine  Aged Out        Assessment/Plan:  This is a routine wellness examination for Dana Hodge.  Referral to [facility]: pt c/o difficulty hearing   Patient Care Team: Chandra Toribio POUR, MD as PCP - General (Family Medicine) Cleatus Collar, MD as Consulting Physician (Ophthalmology) Fleeta Zerita DASEN, MD as Consulting Physician (Ophthalmology)  I have personally  reviewed and noted the following in the patients chart:   Medical and social history Use of alcohol, tobacco or illicit drugs  Current medications and supplements including opioid prescriptions. Functional ability and status Nutritional status Physical activity Advanced directives List of other physicians Hospitalizations, surgeries, and ER visits in previous 12 months Vitals Screenings to include cognitive, depression, and falls Referrals and appointments  Orders Placed This  Encounter  Procedures   Ambulatory referral to Audiology    Referral Priority:   Routine    Referral Type:   Audiology Exam    Referral Reason:   Specialty Services Required    Referred to Provider:   Helane Darryle LABOR, AUD    Number of Visits Requested:   1   In addition, I have reviewed and discussed with patient certain preventive protocols, quality metrics, and best practice recommendations. A written personalized care plan for preventive services as well as general preventive health recommendations were provided to patient.   Dana Hodge, CMA   02/28/2024   Return in 1 year (on 02/27/2025).  After Visit Summary: (MyChart) Due to this being a telephonic visit, the after visit summary with patients personalized plan was offered to patient via MyChart   Nurse Notes: scheduled 2027 AWV appt "

## 2024-03-05 ENCOUNTER — Other Ambulatory Visit: Payer: Self-pay | Admitting: Family Medicine

## 2024-03-05 DIAGNOSIS — E782 Mixed hyperlipidemia: Secondary | ICD-10-CM

## 2024-03-12 ENCOUNTER — Other Ambulatory Visit: Payer: Self-pay | Admitting: Family Medicine

## 2024-03-12 DIAGNOSIS — I10 Essential (primary) hypertension: Secondary | ICD-10-CM

## 2024-12-04 ENCOUNTER — Other Ambulatory Visit

## 2024-12-11 ENCOUNTER — Encounter: Admitting: Family Medicine

## 2024-12-15 ENCOUNTER — Encounter: Admitting: Family Medicine

## 2025-03-12 ENCOUNTER — Ambulatory Visit
